# Patient Record
Sex: Male | Born: 1935 | Race: White | Hispanic: No | Marital: Married | Smoking: Never smoker
Health system: Southern US, Community
[De-identification: ages and names within clinical notes are randomized; demographics above are authoritative.]

## PROBLEM LIST (undated history)

## (undated) DIAGNOSIS — I639 Cerebral infarction, unspecified: Secondary | ICD-10-CM

## (undated) DIAGNOSIS — Z9889 Other specified postprocedural states: Secondary | ICD-10-CM

## (undated) DIAGNOSIS — M1990 Unspecified osteoarthritis, unspecified site: Secondary | ICD-10-CM

## (undated) DIAGNOSIS — B029 Zoster without complications: Secondary | ICD-10-CM

## (undated) DIAGNOSIS — N189 Chronic kidney disease, unspecified: Secondary | ICD-10-CM

## (undated) DIAGNOSIS — H9190 Unspecified hearing loss, unspecified ear: Secondary | ICD-10-CM

## (undated) DIAGNOSIS — E119 Type 2 diabetes mellitus without complications: Secondary | ICD-10-CM

## (undated) DIAGNOSIS — R112 Nausea with vomiting, unspecified: Secondary | ICD-10-CM

## (undated) DIAGNOSIS — E7800 Pure hypercholesterolemia, unspecified: Secondary | ICD-10-CM

## (undated) DIAGNOSIS — F419 Anxiety disorder, unspecified: Secondary | ICD-10-CM

## (undated) DIAGNOSIS — C801 Malignant (primary) neoplasm, unspecified: Secondary | ICD-10-CM

## (undated) DIAGNOSIS — I2510 Atherosclerotic heart disease of native coronary artery without angina pectoris: Secondary | ICD-10-CM

## (undated) DIAGNOSIS — I10 Essential (primary) hypertension: Secondary | ICD-10-CM

## (undated) HISTORY — DX: Pure hypercholesterolemia, unspecified: E78.00

## (undated) HISTORY — PX: TOTAL KNEE  PROSTHESIS REMOVAL W/ SPACER INSERTION: SHX2541

## (undated) HISTORY — DX: Zoster without complications: B02.9

## (undated) HISTORY — DX: Malignant (primary) neoplasm, unspecified: C80.1

## (undated) HISTORY — DX: Chronic kidney disease, unspecified: N18.9

## (undated) HISTORY — DX: Cerebral infarction, unspecified: I63.9

---
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
## 1993-09-12 HISTORY — PX: EYE SURGERY: SHX253

## 1995-09-13 HISTORY — PX: CORONARY ARTERY BYPASS GRAFT: SHX141

## 1998-10-09 ENCOUNTER — Ambulatory Visit (HOSPITAL_BASED_OUTPATIENT_CLINIC_OR_DEPARTMENT_OTHER): Admission: RE | Admit: 1998-10-09 | Discharge: 1998-10-09 | Payer: Self-pay | Admitting: Orthopedic Surgery

## 2001-04-26 ENCOUNTER — Ambulatory Visit (HOSPITAL_COMMUNITY): Admission: RE | Admit: 2001-04-26 | Discharge: 2001-04-26 | Payer: Self-pay | Admitting: Cardiology

## 2001-04-26 ENCOUNTER — Encounter: Payer: Self-pay | Admitting: Cardiology

## 2001-07-31 ENCOUNTER — Ambulatory Visit (HOSPITAL_COMMUNITY): Admission: RE | Admit: 2001-07-31 | Discharge: 2001-07-31 | Payer: Self-pay | Admitting: Ophthalmology

## 2004-10-28 ENCOUNTER — Ambulatory Visit: Payer: Self-pay | Admitting: *Deleted

## 2004-11-02 ENCOUNTER — Ambulatory Visit: Payer: Self-pay

## 2004-11-05 ENCOUNTER — Inpatient Hospital Stay (HOSPITAL_COMMUNITY): Admission: RE | Admit: 2004-11-05 | Discharge: 2004-11-09 | Payer: Self-pay | Admitting: Orthopedic Surgery

## 2004-11-05 ENCOUNTER — Ambulatory Visit: Payer: Self-pay | Admitting: Physical Medicine & Rehabilitation

## 2004-11-09 ENCOUNTER — Ambulatory Visit (HOSPITAL_COMMUNITY)
Admission: RE | Admit: 2004-11-09 | Discharge: 2004-11-09 | Payer: Self-pay | Admitting: Physical Medicine & Rehabilitation

## 2004-11-09 ENCOUNTER — Inpatient Hospital Stay
Admission: RE | Admit: 2004-11-09 | Discharge: 2004-11-16 | Payer: Self-pay | Admitting: Physical Medicine & Rehabilitation

## 2004-11-14 ENCOUNTER — Encounter (HOSPITAL_COMMUNITY)
Admission: RE | Admit: 2004-11-14 | Discharge: 2004-11-16 | Payer: Self-pay | Admitting: Physical Medicine & Rehabilitation

## 2010-01-12 ENCOUNTER — Ambulatory Visit (HOSPITAL_COMMUNITY): Admission: RE | Admit: 2010-01-12 | Discharge: 2010-01-12 | Payer: Self-pay | Admitting: Internal Medicine

## 2011-01-28 NOTE — H&P (Signed)
[name], [name] NO.:  [id number]   MEDICAL RECORD NO.:  [id number]          PATIENT TYPE:  ORB   LOCATION:                               FACILITY:  [facility]   PHYSICIAN:  Meredith Staggers, M.D.DATE OF BIRTH:  1935-10-21   DATE OF ADMISSION:  11/09/2004  DATE OF DISCHARGE:                                HISTORY & PHYSICAL   CHIEF COMPLAINT:  Right knee pain.   HISTORY OF PRESENT ILLNESS:  This is a 75 year old white male with history  of hypertension and coronary artery disease with increasing right knee pain  and DJD over the last several months to years.  Patient elected to undergo  right total knee replacement which was performed on November 05, 2004 by Dr.  Berenice Primas.  Patient was placed on Coumadin for DVT prophylaxis.  He has been  slow to progress with therapy and is at a mod to min assist level with basic  mobility and transfers.  It was decided to bring patient to subacute  rehabilitation to achieve modified independent goals.   REVIEW OF SYSTEMS:  Patient reports cough, reflux, low back pain, some  anxiety.  He has had problems with ongoing nausea, vomiting, and decreased  appetite which has been improving.   PAST MEDICAL HISTORY:  1.  Hypertension.  2.  Coronary artery disease with CABG in 1997.  3.  Cataract surgery.  4.  Gout.  5.  Hyperlipidemia.  6.  Anxiety.  7.  Asthma.  8.  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   HABITS:  Patient denies alcohol or tobacco.   FAMILY HISTORY:  Noncontributory.   SOCIAL HISTORY:  Patient lives with his wife in [place].  Patient works  part-time as a Hotel manager.  His wife works days full-time.  They live in a one-  level home with ramp.  Patient was independent prior to arrival.   MEDICATIONS PRIOR TO ADMISSION:  1.  Allopurinol 300 mg daily.  2.  Lipitor 20 mg q.h.s.  3.  Toprol 150 mg daily.  4.  Theo-Dur 300 mg b.i.d.  5.  Combivent inhaler.  6.  Aspirin 325 mg  daily.  7.  Dyazide 37.5/25 q.48h.  8.  Xanax 0.5 mg t.i.d. p.r.n.   ALLERGIES:  None.   LABORATORIES:  Hemoglobin 10.3.  Potassium 3.4.  INR 3.2.   PHYSICAL EXAMINATION:  VITAL SIGNS:  Patient is afebrile.  Vital signs are  stable.  GENERAL:  He is alert and oriented x3.  Pupils are equal, round, and  reactive to light and accommodation.  Extraocular eye movements are intact.  Oral mucosa is pink and moist.  NECK:  Supple without JVD or adenopathy.  CHEST:  Clear bilaterally without wheezes, rales, or rhonchi.  HEART:  Regular rate and rhythm without murmurs, rubs, or gallops.  ABDOMEN:  Soft, nontender, and minimally distended.  MUSCULOSKELETAL:  Patient had limited range of motion of the left shoulder.  He was frozen in extension at the left wrist.  He was minus 28-30 degrees at  the left elbow for full extension.  He was able to actively adduct the left  shoulder to plus 75-80 degrees.  Left arm was hypoplastic in comparison to  the right side.  Right knee was appropriately tender.  Patient had 1+/2  strength at the hips and knee on the right and 4/5 strength distally.  Left  lower extremity was 4+/5 throughout.  Right upper extremity was within  normal limits at 5/5.  NEUROLOGIC:  Sensory examination was grossly intact.  Reflexes were 2+.  Cranial nerve examination was unremarkable.  Patient was normal in respect  to judgment, orientation, and memory.  Affect is appropriate.  SKIN:  It was of note that there was some mild redness around the right knee  wound which was moderately warm.   ASSESSMENT/PLAN:  1.  Right knee osteoarthritis with right total knee replacement performed on      November 05, 2004.  Begin SACU level therapies.  Goals are modified      independent.  Estimated length of stay is about seven days.  Prognosis      is good.  2.  Pain management with oxycodone IR p.r.n. as well as p.r.n. Robaxin for      spasms.  3.  Deep venous thrombosis prophylaxis with  Coumadin.  4.  Hypertension.  This appears stable.  Monitor on Toprol and Maxzide      daily.  5.  Asthma, Combivent, theophylline daily.  6.  Coronary artery disease.  Patient stable here.  Resume aspirin after      Coumadin is complete.  7.  Anemia.  Begin Trinsicon one p.o. b.i.d.      ZTS/MEDQ  D:  11/09/2004  T:  11/09/2004  Job:  [id number]

## 2011-01-28 NOTE — Discharge Summary (Signed)
NAMEYUDA, CUPO NO.:  [id number]   MEDICAL RECORD NO.:  [id number]          PATIENT TYPE:  ORB   LOCATION:  [id number]                         FACILITY:  [facility]   PHYSICIAN:  Meredith Staggers, M.D.DATE OF BIRTH:  Jan 15, 1936   DATE OF ADMISSION:  11/09/2004  DATE OF DISCHARGE:  11/16/2004                                 DISCHARGE SUMMARY   DISCHARGE DIAGNOSES:  1.  Right total knee arthroplasty secondary to osteoarthritis.  2.  Deep venous thrombosis prophylaxis.  3.  History of asthma.  4.  Gout.  5.  History of hyperlipidemia.  6.  Anemia.  7.  Mild hypokalemia, resolved.  8.  Tachycardia.   HISTORY OF PRESENT ILLNESS:  The patient is a 75 year old white male with  past medical history of hypertension and cardiovascular disease, right knee  pain secondary to degenerative joint disease which failed conservative care,  therefore, the patient was admitted on November 09, 2004 for right total  knee arthroplasty by Dr. Dorna Leitz. Patient was placed on Coumadin for  deep venous thrombosis prophylaxis.  Postoperative complications included  anemia.  Physical therapy report at this time indicates the patient is  ambulatory total assist 10 feet with rolling walker and transfer mod assist,  bed mobility mod assist.  The patient was transferred to [facility] subacute  department on November 09, 2004 for more therapies.  Review of systems was  significant for cough, reflux, back pain, anxiety.   PAST MEDICAL HISTORY:  Significant for hypertension, cardiovascular disease,  coronary artery bypass grafting, cataract surgery, gout, hyperlipidemia,  anxiety, asthma, left arm birth defect.   FAMILY HISTORY:  Noncontributory.   SOCIAL HISTORY:  Patient lives with wife in [place].  Patient works part  time as a Hotel manager.  Wife and daughter work during days but can assist.  Patient lives in one level home with ramp.   ADMISSION MEDICATIONS:  1.  Allopurinol 300 mg  daily.  2.  Lipitor 20 mg daily.  3.  Toprol XL 50 mg daily.  4.  Theophylline 300 mg b.i.d.  5.  Combivent inhaler as needed.  6.  Aspirin 325 mg daily.  7.  Maxzide 37.5 mg q.48h.  8.  Xanax 0.5 mg three times a day.   HOSPITAL COURSE:  Mr. Denton Lamarche was admitted to [facility] Subacute  Department on November 09, 2004 for an hour of therapies daily.  Overall he  progressed very well to overall modified independent level.  The patient was  able to tolerate therapies and remained on oxycodone as needed for pain.  The patient continued to take Lovenox until INR was therapeutic.  He was  discharged home on Coumadin 4 mg daily until December 03, 2004.  His hospital  course was significant for tachycardia with slightly increased blood  pressure, gout flare up and anemia.  He remained on Trinsicon one tablet  p.o. b.i.d. for anemia.  Latest hemoglobin performed on November 10, 2004 was  11.1 and hematocrit 31.8.  Patient received colchicine 0.6 mg p.o. b.i.d.  for three days as well as a steroid patch for  gout flare up in his right  toe.  After application of prednisone patch he improved significantly.  Patient also prior to admission was on Toprol XL 50 mg daily.  This was  increased to 100 mg XL daily due to tachycardia and increased blood  pressure.  Blood pressure and tachycardia did improve.  Patient was  recommended to follow up with his primary care physician, Dr. Caron Presume,  regarding tachycardia and blood pressure.  Patient did receive a short dose  of potassium supplementation for hypokalemia.  His potassium was 4.2 on  November 12, 2004.  Potassium was discontinued at the time of discharge.  Overall the patient was able to tolerate the therapies very well. He did  have past surgery his incision was healing very well.  He was able to  ambulate greater than 100 feet with modified rolling walker, able to  transfer modified independently, bed mobility modified independently.  Patient had  approximately 0 to 70 degrees range of motion in his right knee,  will go home on CPM machine.  There was no other major issues that occurred  while the patient was in rehab.  Physical therapy recommends home CPM,  continue physical therapy.   LABORATORY DATA:  Latest labs indicated his last uric acid level was November 15, 2004, was 4.9.  Latest INR was 2.7.  Latest hemoglobin was 11.1,  hematocrit 31.8, white blood cell count 10.8, platelet count 471,000.  Sodium 132, potassium 4.2, chloride 97, cO2 26, glucose 123, BUN 13,  creatinine 1.0.   The patient at the time of discharge was able to perform most activities of  daily living modified independently supervision level.  Able to transfer  modified independently, bed mobility modified independently, ambulation to  100 feet modified independently with platform rolling walker plus he had 0  to 70 degrees range of motion in his right knee.  Recommend he was  discharged home with his family.  At the time of discharge his surgical  incision was healed with no signs of infection.  There was about 1+ edema.  He is discharged home with his family.   DISCHARGE MEDICATIONS:  1.  Toprol XL 100 mg daily.  2.  Allopurinol 300 mg daily.  3.  Resume Maxzide and Combivent.  4.  Aspirin.  Do not take until complete Coumadin.  5.  Lipitor 20 mg at night.  6.  Theo-Dur 300 mg AM and in PM.  7.  Trinsicon one tablet twice daily.  8.  Xanax 0.5 mg twice daily.  9.  Coumadin 2 mg in PM in until December 03, 2004.  10. Oxycodone 5 to 10 mg every 4-6 hours as needed.  11. Robaxin 500 mg one to two tablets every 6 to 8 hours as needed.  12. Prednisone per Dosepak instructions.   DIET:  Low salt, low cholesterol.   FOLLOW UP:  He will follow up with Dr. Caron Presume in four to six weeks to  check for tachycardia and blood pressure and his potassium.  Follow up with  Dr. Dorna Leitz in two weeks, call for appointment, have [facility] with physical  therapy and occupational therapy and home health nurse to draw  Pro Time and INR on  Thursday, November 18, 2004.  No alcohol, no tobacco, no driving.  He is weight-  bearing as tolerated.  No aspirin, Aleve, ibuprofen while on Coumadin.  Any  questions call Pamella Pert.  Continue occupational therapy and physical  therapy through [facility].      LB/MEDQ  D:  11/16/2004  T:  11/16/2004  Job:  [id number]   cc:   Alta Corning, M.D.  [address] [place]  [place] [address]  Fax: [phone]   Bonne Dolores, M.D.  [address] [place]  [place] [address]  Fax: [phone]

## 2011-01-28 NOTE — Op Note (Signed)
[name], [name] NO.:  [id number]   MEDICAL RECORD NO.:  [id number]          PATIENT TYPE:  INP   LOCATION:  [id number]                         FACILITY:  [facility]   PHYSICIAN:  Alta Corning, M.D.   DATE OF BIRTH:  1936/08/23   DATE OF PROCEDURE:  11/05/2004  DATE OF DISCHARGE:                                 OPERATIVE REPORT   PREOPERATIVE DIAGNOSIS:  End stage degenerative joint disease, right knee.   POSTOPERATIVE DIAGNOSIS:  End stage degenerative joint disease, right knee.   PROCEDURE:  Right total knee replacement with Sigma system, size 3 femur,  size 4 tibia, 10 mm bridging bearing, 32 mm all polyethylene patella.   SURGEON:  Alta Corning, M.D.   ASSISTANT:  Gary Fleet, P.A.-C.   ANESTHESIA:  General.   BRIEF HISTORY:  75 year old male with a long history of having significant  right knee pain.  We have treated him for five years with anti-inflammatory  medications, injection therapy, activity modification, and at the end of all  this, he still continued to complain of significant right lower extremity  pain, so he was brought to the operating room for right total knee  replacement.  Preoperative x-rays showed bone on bone degenerative changes.   DESCRIPTION OF PROCEDURE:  The patient was brought to the operating room and  after adequate anesthesia was obtained with a general anesthetic, the  patient was placed on the operating table.  The right leg was prepped and  draped in the usual sterile fashion.  Following this, a midline incision was  made and the subcutaneous tissue were dissected down to the level of the  extensor mechanism.  A medial parapatellar arthrotomy was undertaken as well  as release of the medial side.  The medial and lateral meniscus were  removed, at this point, as well as anterior and posterior cruciates and  retropatellar fat pad.  The tibia was then cut perpendicular to its long  axis, the distal femur was cut, and  spacing block was put in place, 10 mm  block gave easy full extension.  The attention was turned towards sizing  where the femur was sized to a 3 and then cuts were made, anterior,  posterior, and chamfers, the 10 block was used prior to cutting the  posterior to make sure that this was the right size and it was.  Following  this, attention was turned towards the box cut and this was cut and then the  tibia was sized to a 4.  It was pinned and central peg was drilled as well  as the cut outs.  The patella was cut to a level of 13 mm and a 32 mm all  poly patella was chosen and the lugs were drilled.  The trial components  were then removed.  The knee was copiously irrigated and suctioned dry.  The  tibia then had cement placed into the interstices of the dried area and the  components were cemented into place, size 4 tibia, size 3 femur, 35 mm all  poly patella, 10 mm bridging bearing.  A  trial bearing was put in place  initially.  All excess cement was removed with the cement tool, and the  final poly was then put in place and the patella was cemented in place.  The  components were allowed to harden in place.  A medium Hemovac was  put in place.  The parapatellar arthrotomy was closed with #1 Vicryl running  locking suture and the skin was closed with 0 and 2-0 Ethibond and skin  staples.  A sterile compression dressing was applied.  The patient was taken  to the recovery room where he was noted to be in satisfactory condition.  Estimated blood loss for the procedure was none.      JLG/MEDQ  D:  11/05/2004  T:  11/05/2004  Job:  [id number]

## 2011-01-28 NOTE — Discharge Summary (Signed)
[name], Warren NO.:  [id number]   MEDICAL RECORD NO.:  [id number]          PATIENT TYPE:  INP   LOCATION:  [id number]                         FACILITY:  [facility]   PHYSICIAN:  Gary Fleet, P.A.    DATE OF BIRTH:  Jul 12, 1936   DATE OF ADMISSION:  11/05/2004  DATE OF DISCHARGE:  11/09/2004                                 DISCHARGE SUMMARY   ADMITTING DIAGNOSES:  1.  End-stage degenerative joint disease, right knee.  2.  Hypertension.  3.  Coronary artery disease.  4.  History of gout.   DISCHARGE DIAGNOSES:  1.  End-stage degenerative joint disease, right knee.  2.  Hypertension.  3.  Coronary artery disease.  4.  History of gout.   PROCEDURES IN HOSPITAL:  Right total knee arthroplasty, Dorna Leitz, MD,  November 05, 2004.   BRIEF HISTORY:  [name] is a pleasant 75 year old male, who has a long  history of right knee pain.  Standing x-rays of the right knee show bone-on-  bone arthritis.  He has night pain and pain with ambulation.  He got no  relief with injections, modification of activity, and exercises.  Based upon  his clinical and radiographic findings, he was felt to be a candidate for a  right total knee arthroplasty, and he was admitted for this.   PERTINENT LABORATORY STUDIES:  EKG on admission showed sinus bradycardia  with incomplete right bundle branch block, borderline EKG, no old tracing to  compare.  Hemoglobin on admission was 14.1, hematocrit 40.5.  On postop day  #1, his hemoglobin was 12.1, postop day #2, 10.9, postop day #3, 10.5, and  postop day #4, 10.3.  Protime was 12.7 seconds on admission with an INR of  0.9.  PTT was 28.  On Coumadin therapy on the date of discharge, patient's  INR was 3.2 with a protime of 25.2.  CMET on admission was within normal  limits.  On postop day #1, BMET was within normal limits.  On postop day #2,  he had a slightly decreased potassium at 3.4, elevated glucose at 173 on his  BMET.  Urinalysis  showed no abnormalities.   HOSPITAL COURSE:  The patient underwent right total knee arthroplasty as  well-described in Dr. Berenice Primas' operative note on November 05, 2004.  Postoperatively, he was put on a PCA morphine pump for pain control and was  given 1 gm of Ancef q.8h x5 doses IV.  A CPM machine was ordered for knee  range of motion on the right side.  On postop day #1, he was gotten out of  bed with Physical Therapy.  His hemoglobin was stable.  His BMET was within  normal limits, and his INR was stable on Coumadin with an INR of 1.1.  On  postop day #2, he had a low-grade fever of 101.6.  Incentive spirometry was  encouraged.  He had some nausea and vomiting, but patient was resting  comfortably.  He made good progress with physical therapy.  His PCA pump and  his Foley catheter were discontinued, the right knee dressing was changed,  and the Hemovac drain was pulled.  On postop day #3, he had some severe  nausea.  His vital signs were stable and afebrile.  He was making good  progress with physical therapy, although somewhat slow.  His IV fluids were  increased.  We continued to mobilize the patient.  He was also noted to be  on Coumadin for DVT prophylaxis.  On postop day #4, his nausea and vomiting  had improved, he was taking p.o. ice chips without difficulty, he was  voiding okay, and he had had a bowel movement.  Vital signs were stable and  afebrile.  His right knee dressing was clean and dry.  His calf was soft and  NV was intact distally.  His INR was 3.2 on Coumadin, hemoglobin 10.3,  potassium 3.4.  His diet was increased to a regular, his IV was converted to  a saline lock, and he was felt to be stable medically to be transferred to  the subacute care unit when a bed was available, which there was.  He was  then discharged and transferred to the subacute care unit at [facility]:  Improved.   DIET ON DISCHARGE:  Regular.   MEDICATIONS ON  DISCHARGE:  1.  Percocet 5 mg 1-2 q.4h p.r.n. for pain.  2.  Coumadin per pharmacy protocol.  3.  Lipitor 20 mg one daily.  4.  Toprol-XL 50 mg one daily.  5.  Theo-Dur 300 mg b.i.d.  6.  Xanax 0.5 mg one t.i.d. p.r.n.  7.  Combivent inhaler p.r.n.  8.  Dyazide 37.5/25 mg one q.o.d.  9.  Allopurinol 300 mg daily.   ACTIVITY STATUS:  Weightbearing as tolerated on the right with a walker.  He  will continue to use a CPM machine.  We will see him every few days while on  the SACU unit.       JB/MEDQ  D:  02/25/2005  T:  02/26/2005  Job:  [id number]   cc:   Bonne Dolores, M.D.  [address] [place] [address]  Fax: [employer], M.D.

## 2011-11-21 NOTE — Patient Instructions (Addendum)
[place]  11/21/2011   Your procedure is scheduled on:  11/28/2011  Report to [facility] at  55  AM.  Call this number if you have problems the morning of surgery: [phone]   Remember:   Do not eat food:After Midnight.  May have clear liquids:until Midnight .  Clear liquids include soda, tea, black coffee, apple or grape juice, broth.  Take these medicines the morning of surgery with A SIP OF WATER: xanax,alloprinol,tenormin,hctz,theodur. Take combivent before you come.   Do not wear jewelry, make-up or nail polish.  Do not wear lotions, powders, or perfumes. You may wear deodorant.  Do not shave 48 hours prior to surgery.  Do not bring valuables to the hospital.  Contacts, dentures or bridgework may not be worn into surgery.  Leave suitcase in the car. After surgery it may be brought to your room.  For patients admitted to the hospital, checkout time is 11:00 AM the day of discharge.   Patients discharged the day of surgery will not be allowed to drive home.  Name and phone number of your driver: family  Special Instructions: N/A   Please read over the following fact sheets that you were given: Pain Booklet, Surgical Site Infection Prevention, Anesthesia Post-op Instructions and Care and Recovery After Surgery Cataract A cataract is a clouding of the lens of the eye. When a lens becomes cloudy, vision is reduced based on the degree and nature of the clouding. Many cataracts reduce vision to some degree. Some cataracts make people more near-sighted as they develop. Other cataracts increase glare. Cataracts that are ignored and become worse can sometimes look white. The white color can be seen through the pupil. CAUSES   Aging. However, cataracts may occur at any age, even in newborns.   Certain drugs.   Trauma to the eye.   Certain diseases such as diabetes.   Specific eye diseases such as chronic inflammation inside the eye or a sudden attack of a rare form of  glaucoma.   Inherited or acquired medical problems.  SYMPTOMS   Gradual, progressive drop in vision in the affected eye.   Severe, rapid visual loss. This most often happens when trauma is the cause.  DIAGNOSIS  To detect a cataract, an eye doctor examines the lens. Cataracts are best diagnosed with an exam of the eyes with the pupils enlarged (dilated) by drops.  TREATMENT  For an early cataract, vision may improve by using different eyeglasses or stronger lighting. If that does not help your vision, surgery is the only effective treatment. A cataract needs to be surgically removed when vision loss interferes with your everyday activities, such as driving, reading, or watching TV. A cataract may also have to be removed if it prevents examination or treatment of another eye problem. Surgery removes the cloudy lens and usually replaces it with a substitute lens (intraocular lens, IOL).  At a time when both you and your doctor agree, the cataract will be surgically removed. If you have cataracts in both eyes, only one is usually removed at a time. This allows the operated eye to heal and be out of danger from any possible problems after surgery (such as infection or poor wound healing). In rare cases, a cataract may be doing damage to your eye. In these cases, your caregiver may advise surgical removal right away. The vast majority of people who have cataract surgery have better vision afterward. HOME CARE INSTRUCTIONS  If you are not planning  surgery, you may be asked to do the following:  Use different eyeglasses.   Use stronger or brighter lighting.   Ask your eye doctor about reducing your medicine dose or changing medicines if it is thought that a medicine caused your cataract. Changing medicines does not make the cataract go away on its own.   Become familiar with your surroundings. Poor vision can lead to injury. Avoid bumping into things on the affected side. You are at a higher risk for  tripping or falling.   Exercise extreme care when driving or operating machinery.   Wear sunglasses if you are sensitive to bright light or experiencing problems with glare.  SEEK IMMEDIATE MEDICAL CARE IF:   You have a worsening or sudden vision loss.   You notice redness, swelling, or increasing pain in the eye.   You have a fever.  Document Released: 08/29/2005 Document Revised: 08/18/2011 Document Reviewed: 04/22/2011 [facility] Patient Information 2012 [place].PATIENT INSTRUCTIONS POST-ANESTHESIA  IMMEDIATELY FOLLOWING SURGERY:  Do not drive or operate machinery for the first twenty four hours after surgery.  Do not make any important decisions for twenty four hours after surgery or while taking narcotic pain medications or sedatives.  If you develop intractable nausea and vomiting or a severe headache please notify your doctor immediately.  FOLLOW-UP:  Please make an appointment with your surgeon as instructed. You do not need to follow up with anesthesia unless specifically instructed to do so.  WOUND CARE INSTRUCTIONS (if applicable):  Keep a dry clean dressing on the anesthesia/puncture wound site if there is drainage.  Once the wound has quit draining you may leave it open to air.  Generally you should leave the bandage intact for twenty four hours unless there is drainage.  If the epidural site drains for more than 36-48 hours please call the anesthesia department.  QUESTIONS?:  Please feel free to call your physician or the hospital operator if you have any questions, and they will be happy to assist you.     [place] [phone]

## 2011-11-22 ENCOUNTER — Encounter (HOSPITAL_COMMUNITY): Payer: Self-pay

## 2011-11-22 ENCOUNTER — Encounter (HOSPITAL_COMMUNITY): Payer: Self-pay | Admitting: Pharmacy Technician

## 2011-11-22 ENCOUNTER — Other Ambulatory Visit: Payer: Self-pay

## 2011-11-22 ENCOUNTER — Encounter (HOSPITAL_COMMUNITY)
Admission: RE | Admit: 2011-11-22 | Discharge: 2011-11-22 | Disposition: A | Discharge status: Home / Self Care | Payer: Medicare Other | Source: Ambulatory Visit | Attending: Ophthalmology | Admitting: Ophthalmology

## 2011-11-22 HISTORY — DX: Atherosclerotic heart disease of native coronary artery without angina pectoris: I25.10

## 2011-11-22 HISTORY — DX: Unspecified hearing loss, unspecified ear: H91.90

## 2011-11-22 HISTORY — DX: Other specified postprocedural states: Z98.890

## 2011-11-22 HISTORY — DX: Essential (primary) hypertension: I10

## 2011-11-22 HISTORY — DX: Unspecified osteoarthritis, unspecified site: M19.90

## 2011-11-22 HISTORY — DX: Nausea with vomiting, unspecified: R11.2

## 2011-11-22 HISTORY — DX: Anxiety disorder, unspecified: F41.9

## 2011-11-22 LAB — HEMOGLOBIN AND HEMATOCRIT, BLOOD
HCT: 38.5 % — ABNORMAL LOW (ref 39.0–52.0)
Hemoglobin: 13.4 g/dL (ref 13.0–17.0)

## 2011-11-22 LAB — BASIC METABOLIC PANEL
BUN: 27 mg/dL — ABNORMAL HIGH (ref 6–23)
Calcium: 10.1 mg/dL (ref 8.4–10.5)
Creatinine, Ser: 1.41 mg/dL — ABNORMAL HIGH (ref 0.50–1.35)
GFR calc Af Amer: 56 mL/min — ABNORMAL LOW (ref >90)

## 2011-11-22 MED ORDER — CYCLOPENTOLATE-PHENYLEPHRINE 0.2-1 % OP SOLN
OPHTHALMIC | Status: AC
  Filled 2011-11-22: qty 2

## 2011-11-22 MED ORDER — TETRACAINE HCL 0.5 % OP SOLN: 1.0000 [drp] | Freq: Once | OPHTHALMIC | Status: DC

## 2011-11-28 ENCOUNTER — Ambulatory Visit (HOSPITAL_COMMUNITY): Payer: Medicare Other | Admitting: Anesthesiology

## 2011-11-28 ENCOUNTER — Encounter (HOSPITAL_COMMUNITY): Payer: Self-pay | Admitting: Anesthesiology

## 2011-11-28 ENCOUNTER — Ambulatory Visit (HOSPITAL_COMMUNITY)
Admission: RE | Admit: 2011-11-28 | Discharge: 2011-11-28 | Disposition: A | Discharge status: Home / Self Care | Payer: Medicare Other | Source: Ambulatory Visit | Attending: Ophthalmology | Admitting: Ophthalmology

## 2011-11-28 ENCOUNTER — Encounter (HOSPITAL_COMMUNITY)
Admission: RE | Disposition: A | Discharge status: Home / Self Care | Payer: Self-pay | Source: Ambulatory Visit | Attending: Ophthalmology

## 2011-11-28 ENCOUNTER — Encounter (HOSPITAL_COMMUNITY): Payer: Self-pay | Admitting: *Deleted

## 2011-11-28 DIAGNOSIS — Z951 Presence of aortocoronary bypass graft: Secondary | ICD-10-CM | POA: Insufficient documentation

## 2011-11-28 DIAGNOSIS — Z79899 Other long term (current) drug therapy: Secondary | ICD-10-CM | POA: Insufficient documentation

## 2011-11-28 DIAGNOSIS — Z01810 Encounter for preprocedural cardiovascular examination: Secondary | ICD-10-CM | POA: Insufficient documentation

## 2011-11-28 DIAGNOSIS — Z7982 Long term (current) use of aspirin: Secondary | ICD-10-CM | POA: Insufficient documentation

## 2011-11-28 DIAGNOSIS — I10 Essential (primary) hypertension: Secondary | ICD-10-CM | POA: Insufficient documentation

## 2011-11-28 DIAGNOSIS — H2510 Age-related nuclear cataract, unspecified eye: Secondary | ICD-10-CM | POA: Insufficient documentation

## 2011-11-28 DIAGNOSIS — Z01812 Encounter for preprocedural laboratory examination: Secondary | ICD-10-CM | POA: Insufficient documentation

## 2011-11-28 DIAGNOSIS — I2510 Atherosclerotic heart disease of native coronary artery without angina pectoris: Secondary | ICD-10-CM | POA: Insufficient documentation

## 2011-11-28 HISTORY — PX: CATARACT EXTRACTION W/PHACO: SHX586

## 2011-11-28 SURGERY — PHACOEMULSIFICATION, CATARACT, WITH IOL INSERTION
Anesthesia: Monitor Anesthesia Care | Site: Eye | Laterality: Right | Wound class: Clean

## 2011-11-28 MED ORDER — TETRACAINE HCL 0.5 % OP SOLN
OPHTHALMIC | Status: AC
  Administered 2011-11-28: 07:00:00
  Filled 2011-11-28: qty 2

## 2011-11-28 MED ORDER — NA HYALUR & NA CHOND-NA HYALUR 0.55-0.5 ML IO KIT
PACK | INTRAOCULAR | Status: DC | PRN
  Administered 2011-11-28: 1 via OPHTHALMIC

## 2011-11-28 MED ORDER — LIDOCAINE HCL 3.5 % OP GEL: Freq: Once | OPHTHALMIC | Status: DC

## 2011-11-28 MED ORDER — MIDAZOLAM HCL 2 MG/2ML IJ SOLN
1.0000 mg | INTRAMUSCULAR | Status: DC | PRN
  Administered 2011-11-28: 2 mg via INTRAVENOUS

## 2011-11-28 MED ORDER — MIDAZOLAM HCL 5 MG/5ML IJ SOLN
INTRAMUSCULAR | Status: DC | PRN
  Administered 2011-11-28 (×2): 1 mg via INTRAVENOUS

## 2011-11-28 MED ORDER — GATIFLOXACIN 0.5 % OP SOLN OPTIME - NO CHARGE
OPHTHALMIC | Status: DC | PRN
  Administered 2011-11-28: 1 [drp] via OPHTHALMIC

## 2011-11-28 MED ORDER — MIDAZOLAM HCL 2 MG/2ML IJ SOLN
INTRAMUSCULAR | Status: AC
  Administered 2011-11-28: 2 mg via INTRAVENOUS
  Filled 2011-11-28: qty 2

## 2011-11-28 MED ORDER — BSS IO SOLN
INTRAOCULAR | Status: DC | PRN
  Administered 2011-11-28: 15 mL via INTRAOCULAR

## 2011-11-28 MED ORDER — LIDOCAINE HCL 3.5 % OP GEL
OPHTHALMIC | Status: AC
  Filled 2011-11-28: qty 5

## 2011-11-28 MED ORDER — MOXIFLOXACIN HCL 0.5 % OP SOLN - NO CHARGE
1.0000 [drp] | Freq: Once | OPHTHALMIC | Status: DC
  Filled 2011-11-28: qty 3

## 2011-11-28 MED ORDER — EPINEPHRINE HCL 1 MG/ML IJ SOLN
INTRAMUSCULAR | Status: AC
  Filled 2011-11-28: qty 1

## 2011-11-28 MED ORDER — GATIFLOXACIN 0.5 % OP SOLN OPTIME - NO CHARGE
1.0000 [drp] | Freq: Once | OPHTHALMIC | Status: AC
  Administered 2011-11-28: 1 [drp] via OPHTHALMIC
  Filled 2011-11-28: qty 2.5

## 2011-11-28 MED ORDER — ONDANSETRON HCL 4 MG/2ML IJ SOLN
4.0000 mg | Freq: Once | INTRAMUSCULAR | Status: AC
  Administered 2011-11-28: 4 mg via INTRAVENOUS

## 2011-11-28 MED ORDER — TETRACAINE 0.5 % OP SOLN OPTIME - NO CHARGE
OPHTHALMIC | Status: DC | PRN
  Administered 2011-11-28: 2 [drp] via OPHTHALMIC

## 2011-11-28 MED ORDER — MIDAZOLAM HCL 2 MG/2ML IJ SOLN
INTRAMUSCULAR | Status: AC
  Filled 2011-11-28: qty 2

## 2011-11-28 MED ORDER — KETOROLAC TROMETHAMINE 0.4 % OP SOLN - NO CHARGE
1.0000 [drp] | Freq: Once | OPHTHALMIC | Status: AC
  Administered 2011-11-28: 1 [drp] via OPHTHALMIC
  Filled 2011-11-28: qty 5

## 2011-11-28 MED ORDER — LIDOCAINE 3.5 % OP GEL OPTIME - NO CHARGE
OPHTHALMIC | Status: DC | PRN
  Administered 2011-11-28: 2 [drp] via OPHTHALMIC

## 2011-11-28 MED ORDER — POVIDONE-IODINE 5 % OP SOLN
OPHTHALMIC | Status: DC | PRN
  Administered 2011-11-28: 1 via OPHTHALMIC

## 2011-11-28 MED ORDER — LACTATED RINGERS IV SOLN
INTRAVENOUS | Status: DC
  Administered 2011-11-28: 07:00:00 via INTRAVENOUS

## 2011-11-28 MED ORDER — FENTANYL CITRATE 0.05 MG/ML IJ SOLN: 25.0000 ug | INTRAMUSCULAR | Status: AC | PRN

## 2011-11-28 MED ORDER — LIDOCAINE HCL (PF) 1 % IJ SOLN
INTRAMUSCULAR | Status: AC
  Filled 2011-11-28: qty 2

## 2011-11-28 MED ORDER — ONDANSETRON HCL 4 MG/2ML IJ SOLN
INTRAMUSCULAR | Status: AC
  Administered 2011-11-28: 4 mg via INTRAVENOUS
  Filled 2011-11-28: qty 2

## 2011-11-28 MED ORDER — EPINEPHRINE HCL 1 MG/ML IJ SOLN
INTRAOCULAR | Status: DC | PRN
  Administered 2011-11-28: 07:00:00

## 2011-11-28 MED ORDER — ONDANSETRON HCL 4 MG/2ML IJ SOLN: 4.0000 mg | Freq: Once | INTRAMUSCULAR | Status: AC | PRN

## 2011-11-28 MED ORDER — CARBACHOL 0.01 % IO SOLN
INTRAOCULAR | Status: AC
  Filled 2011-11-28: qty 1.5

## 2011-11-28 SURGICAL SUPPLY — 28 items
CAPSULAR TENSION RING-AMO (OPHTHALMIC RELATED) IMPLANT
CLOTH BEACON ORANGE TIMEOUT ST (SAFETY) ×1 IMPLANT
GLOVE BIO SURGEON STRL SZ7.5 (GLOVE) IMPLANT
GLOVE BIOGEL M 6.5 STRL (GLOVE) IMPLANT
GLOVE BIOGEL PI IND STRL 6.5 (GLOVE) IMPLANT
GLOVE BIOGEL PI IND STRL 7.0 (GLOVE) IMPLANT
GLOVE BIOGEL PI INDICATOR 6.5 (GLOVE) ×2
GLOVE BIOGEL PI INDICATOR 7.0 (GLOVE)
GLOVE ECLIPSE 6.5 STRL STRAW (GLOVE) IMPLANT
GLOVE ECLIPSE 7.5 STRL STRAW (GLOVE) IMPLANT
GLOVE EXAM NITRILE LRG STRL (GLOVE) IMPLANT
GLOVE EXAM NITRILE MD LF STRL (GLOVE) ×1 IMPLANT
GLOVE SKINSENSE NS SZ6.5 (GLOVE)
GLOVE SKINSENSE NS SZ7.0 (GLOVE)
GLOVE SKINSENSE STRL SZ6.5 (GLOVE) IMPLANT
GLOVE SKINSENSE STRL SZ7.0 (GLOVE) IMPLANT
GOWN STRL REIN 2XL LVL4 (GOWN DISPOSABLE) ×1 IMPLANT
INST SET CATARACT ~~LOC~~ (KITS) ×2 IMPLANT
KIT VITRECTOMY (OPHTHALMIC RELATED) IMPLANT
PAD ARMBOARD 7.5X6 YLW CONV (MISCELLANEOUS) ×1 IMPLANT
PROC W NO LENS (INTRAOCULAR LENS)
PROC W SPEC LENS (INTRAOCULAR LENS)
PROCESS W NO LENS (INTRAOCULAR LENS) IMPLANT
PROCESS W SPEC LENS (INTRAOCULAR LENS) IMPLANT
RING MALYGIN (MISCELLANEOUS) IMPLANT
SIGHTPATH CAT PROC W REG LENS (Ophthalmic Related) ×2 IMPLANT
VISCOELASTIC ADDITIONAL (OPHTHALMIC RELATED) IMPLANT
WATER STERILE IRR 250ML POUR (IV SOLUTION) ×1 IMPLANT

## 2011-11-28 NOTE — Anesthesia Procedure Notes (Signed)
Procedure Name: MAC Date/Time: 11/28/2011 7:45 AM Performed by: Vista Deck Pre-anesthesia Checklist: Patient identified, Emergency Drugs available, Suction available, Timeout performed and Patient being monitored Patient Re-evaluated:Patient Re-evaluated prior to inductionOxygen Delivery Method: Nasal Cannula

## 2011-11-28 NOTE — Op Note (Signed)
See scanned op note done today in another system

## 2011-11-28 NOTE — Anesthesia Postprocedure Evaluation (Signed)
  Anesthesia Post-op Note  Patient: [name]  Procedure(s) Performed: Procedure(s) (LRB): CATARACT EXTRACTION PHACO AND INTRAOCULAR LENS PLACEMENT (IOC) (Right)  Patient Location:  Short Stay  Anesthesia Type: MAC  Level of Consciousness: awake  Airway and Oxygen Therapy: Patient Spontanous Breathing  Post-op Pain: none  Post-op Assessment: Post-op Vital signs reviewed, Patient's Cardiovascular Status Stable, Respiratory Function Stable, Patent Airway, No signs of Nausea or vomiting and Pain level controlled  Post-op Vital Signs: Reviewed and stable  Complications: No apparent anesthesia complications

## 2011-11-28 NOTE — Brief Op Note (Signed)
11/28/2011  9:24 AM  PATIENT:  [name]  76 y.o. male  PRE-OPERATIVE DIAGNOSIS:  nuclear cataract right eye  POST-OPERATIVE DIAGNOSIS:  nuclear cataract right eye  PROCEDURE:  Procedure(s): CATARACT EXTRACTION PHACO AND INTRAOCULAR LENS PLACEMENT (IOC)  SURGEON:  Surgeon(s): Williams Che, MD  ASSISTANTS: Zoila Shutter, CST   ANESTHESIA STAFF: Vista Deck, CRNA - CRNA Lerry Liner, MD - Anesthesiologist  ANESTHESIA:   topical and MAC  REQUESTED LENS POWER: 20.0  LENS IMPLANT INFORMATION: see op note  CUMULATIVE DISSIPATED ENERGY:3.84  INDICATIONS:see dictated H&P  OP FINDINGS:moderately dense NS  COMPLICATIONS:None  DICTATION #: see printed/scanned op note  PLAN OF CARE: per patient instructions  PATIENT DISPOSITION:  Short Stay

## 2011-11-28 NOTE — H&P (Signed)
I have reviewed the pre printed H&P, the patient was re-examined, and I have identified no significant interval changes in the patient's medical condition.  There is no change in the plan of care since the history and physical of record. 

## 2011-11-28 NOTE — Discharge Instructions (Signed)
[name] 11/28/2011 Dr. Iona Hansen Post operative Instructions for Cataract Patients  These instructions are for [place] and pertain to the operative eye.  1.  Resume your normal diet and previous oral medicines.  2. Your Follow-up appointment is at Dr. Iona Hansen' office in [employer] on Tuesday, 11/29/11 at 10:00am  .  3. You may leave the hospital when your driver is present and your nurse releases you.  4. Begin Pred Forte (prednisolone acetate 1%), Acular LS (ketorolac tromethamine .4%) and Gatifloxacin 0.5% eye drops; 1 drop each 4 times daily to operative eye. Begin 3 hours after discharge from Short Stay Unit.  Moxifloxacin 0.5% may be substituted for Gatifloxacin using the same instructions.  26. Page Dr. Iona Hansen via beeper [phone] for significant pain in or around operative eye that is not relieved by Tylenol.  6. If you took Plavix before surgery, restart it at the usual dose on the evening of surgery.  7. Wear dark glasses as necessary for excessive light sensitivity.  8. Do no forcefully rub you your operative eye.  9. Keep your operative eye dry for 1 week. You may gently clean your eyelids with a damp washcloth.  10. You may resume normal occupational activities in one week and resume driving as tolerated after the first post operative visit.  11. It is normal to have blurred vision and a scratchy sensation following surgery.  Dr. Iona Hansen: [phone] A cataract is a clouding of the lens of the eye. When a lens becomes cloudy, vision is reduced based on the degree and nature of the clouding. Many cataracts reduce vision to some degree. Some cataracts make people more near-sighted as they develop. Other cataracts increase glare. Cataracts that are ignored and become worse can sometimes look white. The white color can be seen through the pupil. CAUSES   Aging. However, cataracts may occur at any age, even in newborns.   Certain drugs.   Trauma to the eye.    Certain diseases such as diabetes.   Specific eye diseases such as chronic inflammation inside the eye or a sudden attack of a rare form of glaucoma.   Inherited or acquired medical problems.  SYMPTOMS   Gradual, progressive drop in vision in the affected eye.   Severe, rapid visual loss. This most often happens when trauma is the cause.  DIAGNOSIS  To detect a cataract, an eye doctor examines the lens. Cataracts are best diagnosed with an exam of the eyes with the pupils enlarged (dilated) by drops.  TREATMENT  For an early cataract, vision may improve by using different eyeglasses or stronger lighting. If that does not help your vision, surgery is the only effective treatment. A cataract needs to be surgically removed when vision loss interferes with your everyday activities, such as driving, reading, or watching TV. A cataract may also have to be removed if it prevents examination or treatment of another eye problem. Surgery removes the cloudy lens and usually replaces it with a substitute lens (intraocular lens, IOL).  At a time when both you and your doctor agree, the cataract will be surgically removed. If you have cataracts in both eyes, only one is usually removed at a time. This allows the operated eye to heal and be out of danger from any possible problems after surgery (such as infection or poor wound healing). In rare cases, a cataract may be doing damage to your eye. In these cases, your caregiver may advise surgical removal right away. The vast majority  of people who have cataract surgery have better vision afterward. HOME CARE INSTRUCTIONS  If you are not planning surgery, you may be asked to do the following:  Use different eyeglasses.   Use stronger or brighter lighting.   Ask your eye doctor about reducing your medicine dose or changing medicines if it is thought that a medicine caused your cataract. Changing medicines does not make the cataract go away on its own.    Become familiar with your surroundings. Poor vision can lead to injury. Avoid bumping into things on the affected side. You are at a higher risk for tripping or falling.   Exercise extreme care when driving or operating machinery.   Wear sunglasses if you are sensitive to bright light or experiencing problems with glare.  SEEK IMMEDIATE MEDICAL CARE IF:   You have a worsening or sudden vision loss.   You notice redness, swelling, or increasing pain in the eye.   You have a fever.  Document Released: 08/29/2005 Document Revised: 08/18/2011 Document Reviewed: 04/22/2011 [facility] Patient Information 2012 [place].

## 2011-11-28 NOTE — Transfer of Care (Signed)
Immediate Anesthesia Transfer of Care Note  Patient: [name]  Procedure(s) Performed: Procedure(s) (LRB): CATARACT EXTRACTION PHACO AND INTRAOCULAR LENS PLACEMENT (IOC) (Right)  Patient Location: Shortstay  Anesthesia Type: MAC  Level of Consciousness: awake  Airway & Oxygen Therapy: Patient Spontanous Breathing   Post-op Assessment: Report given to PACU RN, Post -op Vital signs reviewed and stable and Patient moving all extremities  Post vital signs: Reviewed and stable  Complications: No apparent anesthesia complications

## 2011-11-28 NOTE — Anesthesia Preprocedure Evaluation (Signed)
Anesthesia Evaluation  Patient identified by MRN, date of birth, ID band Patient awake    Reviewed: Allergy & Precautions, H&P , NPO status , reviewed documented beta blocker date and time   History of Anesthesia Complications (+) PONV  Airway Mallampati: I      Dental  (+) Partial Upper and Partial Lower   Pulmonary asthma ,  breath sounds clear to auscultation        Cardiovascular hypertension, Pt. on medications + CAD and + CABG Rhythm:Regular Rate:Normal     Neuro/Psych Anxiety    GI/Hepatic   Endo/Other    Renal/GU      Musculoskeletal   Abdominal   Peds  Hematology   Anesthesia Other Findings   Reproductive/Obstetrics                           Anesthesia Physical Anesthesia Plan  ASA: III  Anesthesia Plan: MAC   Post-op Pain Management:    Induction: Intravenous  Airway Management Planned: Nasal Cannula  Additional Equipment:   Intra-op Plan:   Post-operative Plan:   Informed Consent: I have reviewed the patients History and Physical, chart, labs and discussed the procedure including the risks, benefits and alternatives for the proposed anesthesia with the patient or authorized representative who has indicated his/her understanding and acceptance.     Plan Discussed with:   Anesthesia Plan Comments:         Anesthesia Quick Evaluation

## 2011-11-29 ENCOUNTER — Encounter (HOSPITAL_COMMUNITY): Payer: Self-pay | Admitting: Ophthalmology

## 2012-10-05 ENCOUNTER — Ambulatory Visit (INDEPENDENT_AMBULATORY_CARE_PROVIDER_SITE_OTHER): Payer: Medicare Other | Admitting: Urology

## 2012-10-05 DIAGNOSIS — R972 Elevated prostate specific antigen [PSA]: Secondary | ICD-10-CM

## 2012-10-05 DIAGNOSIS — N471 Phimosis: Secondary | ICD-10-CM

## 2012-10-05 DIAGNOSIS — N401 Enlarged prostate with lower urinary tract symptoms: Secondary | ICD-10-CM

## 2013-02-15 ENCOUNTER — Ambulatory Visit (INDEPENDENT_AMBULATORY_CARE_PROVIDER_SITE_OTHER): Payer: Medicare Other | Admitting: Urology

## 2013-02-15 DIAGNOSIS — C61 Malignant neoplasm of prostate: Secondary | ICD-10-CM

## 2013-02-15 DIAGNOSIS — N401 Enlarged prostate with lower urinary tract symptoms: Secondary | ICD-10-CM

## 2013-05-24 ENCOUNTER — Ambulatory Visit (INDEPENDENT_AMBULATORY_CARE_PROVIDER_SITE_OTHER): Payer: Medicare Other | Admitting: Urology

## 2013-05-24 DIAGNOSIS — C61 Malignant neoplasm of prostate: Secondary | ICD-10-CM

## 2013-05-24 DIAGNOSIS — N401 Enlarged prostate with lower urinary tract symptoms: Secondary | ICD-10-CM

## 2013-08-23 ENCOUNTER — Ambulatory Visit (INDEPENDENT_AMBULATORY_CARE_PROVIDER_SITE_OTHER): Payer: Medicare Other | Admitting: Urology

## 2013-08-23 DIAGNOSIS — R972 Elevated prostate specific antigen [PSA]: Secondary | ICD-10-CM

## 2013-08-23 DIAGNOSIS — R82998 Other abnormal findings in urine: Secondary | ICD-10-CM

## 2013-08-23 DIAGNOSIS — N401 Enlarged prostate with lower urinary tract symptoms: Secondary | ICD-10-CM

## 2013-08-23 DIAGNOSIS — C61 Malignant neoplasm of prostate: Secondary | ICD-10-CM

## 2013-11-22 ENCOUNTER — Ambulatory Visit (INDEPENDENT_AMBULATORY_CARE_PROVIDER_SITE_OTHER): Payer: Medicare Other | Admitting: Urology

## 2013-11-22 DIAGNOSIS — N401 Enlarged prostate with lower urinary tract symptoms: Secondary | ICD-10-CM

## 2013-11-22 DIAGNOSIS — C61 Malignant neoplasm of prostate: Secondary | ICD-10-CM

## 2014-05-30 ENCOUNTER — Ambulatory Visit (INDEPENDENT_AMBULATORY_CARE_PROVIDER_SITE_OTHER): Payer: Medicare Other | Admitting: Urology

## 2014-05-30 DIAGNOSIS — N401 Enlarged prostate with lower urinary tract symptoms: Secondary | ICD-10-CM

## 2014-05-30 DIAGNOSIS — N471 Phimosis: Secondary | ICD-10-CM

## 2014-05-30 DIAGNOSIS — C61 Malignant neoplasm of prostate: Secondary | ICD-10-CM

## 2014-05-30 DIAGNOSIS — R972 Elevated prostate specific antigen [PSA]: Secondary | ICD-10-CM

## 2014-05-30 DIAGNOSIS — N478 Other disorders of prepuce: Secondary | ICD-10-CM

## 2014-10-31 DIAGNOSIS — M109 Gout, unspecified: Secondary | ICD-10-CM | POA: Diagnosis not present

## 2014-10-31 DIAGNOSIS — F419 Anxiety disorder, unspecified: Secondary | ICD-10-CM | POA: Diagnosis not present

## 2014-10-31 DIAGNOSIS — Z6830 Body mass index (BMI) 30.0-30.9, adult: Secondary | ICD-10-CM | POA: Diagnosis not present

## 2014-12-22 DIAGNOSIS — C61 Malignant neoplasm of prostate: Secondary | ICD-10-CM | POA: Diagnosis not present

## 2014-12-26 ENCOUNTER — Ambulatory Visit (INDEPENDENT_AMBULATORY_CARE_PROVIDER_SITE_OTHER): Payer: Medicare Other | Admitting: Urology

## 2014-12-26 DIAGNOSIS — R972 Elevated prostate specific antigen [PSA]: Secondary | ICD-10-CM | POA: Diagnosis not present

## 2014-12-26 DIAGNOSIS — C61 Malignant neoplasm of prostate: Secondary | ICD-10-CM

## 2014-12-26 DIAGNOSIS — N401 Enlarged prostate with lower urinary tract symptoms: Secondary | ICD-10-CM

## 2014-12-26 DIAGNOSIS — R351 Nocturia: Secondary | ICD-10-CM

## 2014-12-26 DIAGNOSIS — N138 Other obstructive and reflux uropathy: Secondary | ICD-10-CM | POA: Diagnosis not present

## 2015-04-08 DIAGNOSIS — E1129 Type 2 diabetes mellitus with other diabetic kidney complication: Secondary | ICD-10-CM | POA: Diagnosis not present

## 2015-04-08 DIAGNOSIS — I10 Essential (primary) hypertension: Secondary | ICD-10-CM | POA: Diagnosis not present

## 2015-04-08 DIAGNOSIS — Z0001 Encounter for general adult medical examination with abnormal findings: Secondary | ICD-10-CM | POA: Diagnosis not present

## 2015-04-08 DIAGNOSIS — Z1389 Encounter for screening for other disorder: Secondary | ICD-10-CM | POA: Diagnosis not present

## 2015-04-08 DIAGNOSIS — Z23 Encounter for immunization: Secondary | ICD-10-CM | POA: Diagnosis not present

## 2015-04-08 DIAGNOSIS — M1991 Primary osteoarthritis, unspecified site: Secondary | ICD-10-CM | POA: Diagnosis not present

## 2015-04-08 DIAGNOSIS — Z6830 Body mass index (BMI) 30.0-30.9, adult: Secondary | ICD-10-CM | POA: Diagnosis not present

## 2015-05-28 DIAGNOSIS — I10 Essential (primary) hypertension: Secondary | ICD-10-CM | POA: Diagnosis not present

## 2015-05-28 DIAGNOSIS — M109 Gout, unspecified: Secondary | ICD-10-CM | POA: Diagnosis not present

## 2015-05-28 DIAGNOSIS — N183 Chronic kidney disease, stage 3 (moderate): Secondary | ICD-10-CM | POA: Diagnosis not present

## 2015-05-28 DIAGNOSIS — E785 Hyperlipidemia, unspecified: Secondary | ICD-10-CM | POA: Diagnosis not present

## 2015-06-05 ENCOUNTER — Other Ambulatory Visit (HOSPITAL_COMMUNITY): Payer: Self-pay | Admitting: Nephrology

## 2015-06-05 DIAGNOSIS — N183 Chronic kidney disease, stage 3 unspecified: Secondary | ICD-10-CM

## 2015-06-12 ENCOUNTER — Ambulatory Visit (HOSPITAL_COMMUNITY)
Admission: RE | Admit: 2015-06-12 | Discharge: 2015-06-12 | Disposition: A | Discharge status: Home / Self Care | Payer: Medicare Other | Source: Ambulatory Visit | Attending: Nephrology | Admitting: Nephrology

## 2015-06-12 DIAGNOSIS — N281 Cyst of kidney, acquired: Secondary | ICD-10-CM | POA: Insufficient documentation

## 2015-06-12 DIAGNOSIS — N183 Chronic kidney disease, stage 3 unspecified: Secondary | ICD-10-CM

## 2015-06-29 DIAGNOSIS — C61 Malignant neoplasm of prostate: Secondary | ICD-10-CM | POA: Diagnosis not present

## 2015-07-02 DIAGNOSIS — E785 Hyperlipidemia, unspecified: Secondary | ICD-10-CM | POA: Diagnosis not present

## 2015-07-02 DIAGNOSIS — E1122 Type 2 diabetes mellitus with diabetic chronic kidney disease: Secondary | ICD-10-CM | POA: Diagnosis not present

## 2015-07-02 DIAGNOSIS — N2581 Secondary hyperparathyroidism of renal origin: Secondary | ICD-10-CM | POA: Diagnosis not present

## 2015-07-02 DIAGNOSIS — I10 Essential (primary) hypertension: Secondary | ICD-10-CM | POA: Diagnosis not present

## 2015-07-02 DIAGNOSIS — N183 Chronic kidney disease, stage 3 (moderate): Secondary | ICD-10-CM | POA: Diagnosis not present

## 2015-07-03 ENCOUNTER — Ambulatory Visit (INDEPENDENT_AMBULATORY_CARE_PROVIDER_SITE_OTHER): Payer: Medicare Other | Admitting: Urology

## 2015-07-03 DIAGNOSIS — C61 Malignant neoplasm of prostate: Secondary | ICD-10-CM | POA: Diagnosis not present

## 2015-07-03 DIAGNOSIS — R351 Nocturia: Secondary | ICD-10-CM

## 2015-07-03 DIAGNOSIS — N471 Phimosis: Secondary | ICD-10-CM

## 2015-07-03 DIAGNOSIS — N401 Enlarged prostate with lower urinary tract symptoms: Secondary | ICD-10-CM

## 2015-07-03 DIAGNOSIS — R972 Elevated prostate specific antigen [PSA]: Secondary | ICD-10-CM

## 2015-08-31 DIAGNOSIS — E119 Type 2 diabetes mellitus without complications: Secondary | ICD-10-CM | POA: Diagnosis not present

## 2015-08-31 DIAGNOSIS — M1991 Primary osteoarthritis, unspecified site: Secondary | ICD-10-CM | POA: Diagnosis not present

## 2015-08-31 DIAGNOSIS — Z6830 Body mass index (BMI) 30.0-30.9, adult: Secondary | ICD-10-CM | POA: Diagnosis not present

## 2015-08-31 DIAGNOSIS — I10 Essential (primary) hypertension: Secondary | ICD-10-CM | POA: Diagnosis not present

## 2015-08-31 DIAGNOSIS — Z1389 Encounter for screening for other disorder: Secondary | ICD-10-CM | POA: Diagnosis not present

## 2015-10-01 DIAGNOSIS — N183 Chronic kidney disease, stage 3 (moderate): Secondary | ICD-10-CM | POA: Diagnosis not present

## 2015-10-01 DIAGNOSIS — E1122 Type 2 diabetes mellitus with diabetic chronic kidney disease: Secondary | ICD-10-CM | POA: Diagnosis not present

## 2015-10-01 DIAGNOSIS — N2581 Secondary hyperparathyroidism of renal origin: Secondary | ICD-10-CM | POA: Diagnosis not present

## 2015-10-01 DIAGNOSIS — E871 Hypo-osmolality and hyponatremia: Secondary | ICD-10-CM | POA: Diagnosis not present

## 2015-10-01 DIAGNOSIS — I129 Hypertensive chronic kidney disease with stage 1 through stage 4 chronic kidney disease, or unspecified chronic kidney disease: Secondary | ICD-10-CM | POA: Diagnosis not present

## 2015-11-30 DIAGNOSIS — E119 Type 2 diabetes mellitus without complications: Secondary | ICD-10-CM | POA: Diagnosis not present

## 2015-11-30 DIAGNOSIS — R600 Localized edema: Secondary | ICD-10-CM | POA: Diagnosis not present

## 2015-11-30 DIAGNOSIS — E1129 Type 2 diabetes mellitus with other diabetic kidney complication: Secondary | ICD-10-CM | POA: Diagnosis not present

## 2015-11-30 DIAGNOSIS — I10 Essential (primary) hypertension: Secondary | ICD-10-CM | POA: Diagnosis not present

## 2015-11-30 DIAGNOSIS — C61 Malignant neoplasm of prostate: Secondary | ICD-10-CM | POA: Diagnosis not present

## 2015-12-28 DIAGNOSIS — C61 Malignant neoplasm of prostate: Secondary | ICD-10-CM | POA: Diagnosis not present

## 2016-01-01 ENCOUNTER — Ambulatory Visit (INDEPENDENT_AMBULATORY_CARE_PROVIDER_SITE_OTHER): Payer: Medicare Other | Admitting: Urology

## 2016-01-01 DIAGNOSIS — R972 Elevated prostate specific antigen [PSA]: Secondary | ICD-10-CM | POA: Diagnosis not present

## 2016-01-01 DIAGNOSIS — R351 Nocturia: Secondary | ICD-10-CM | POA: Diagnosis not present

## 2016-01-01 DIAGNOSIS — C61 Malignant neoplasm of prostate: Secondary | ICD-10-CM | POA: Diagnosis not present

## 2016-01-01 DIAGNOSIS — R3121 Asymptomatic microscopic hematuria: Secondary | ICD-10-CM

## 2016-01-01 DIAGNOSIS — N401 Enlarged prostate with lower urinary tract symptoms: Secondary | ICD-10-CM | POA: Diagnosis not present

## 2016-01-28 DIAGNOSIS — E871 Hypo-osmolality and hyponatremia: Secondary | ICD-10-CM | POA: Diagnosis not present

## 2016-01-28 DIAGNOSIS — I129 Hypertensive chronic kidney disease with stage 1 through stage 4 chronic kidney disease, or unspecified chronic kidney disease: Secondary | ICD-10-CM | POA: Diagnosis not present

## 2016-01-28 DIAGNOSIS — E1122 Type 2 diabetes mellitus with diabetic chronic kidney disease: Secondary | ICD-10-CM | POA: Diagnosis not present

## 2016-01-28 DIAGNOSIS — N183 Chronic kidney disease, stage 3 (moderate): Secondary | ICD-10-CM | POA: Diagnosis not present

## 2016-01-28 DIAGNOSIS — N2581 Secondary hyperparathyroidism of renal origin: Secondary | ICD-10-CM | POA: Diagnosis not present

## 2016-02-01 DIAGNOSIS — T148 Other injury of unspecified body region: Secondary | ICD-10-CM | POA: Diagnosis not present

## 2016-02-01 DIAGNOSIS — Z1389 Encounter for screening for other disorder: Secondary | ICD-10-CM | POA: Diagnosis not present

## 2016-02-01 DIAGNOSIS — I10 Essential (primary) hypertension: Secondary | ICD-10-CM | POA: Diagnosis not present

## 2016-02-01 DIAGNOSIS — E119 Type 2 diabetes mellitus without complications: Secondary | ICD-10-CM | POA: Diagnosis not present

## 2016-02-23 DIAGNOSIS — H538 Other visual disturbances: Secondary | ICD-10-CM | POA: Diagnosis not present

## 2016-02-23 DIAGNOSIS — Z961 Presence of intraocular lens: Secondary | ICD-10-CM | POA: Diagnosis not present

## 2016-03-18 DIAGNOSIS — Z6829 Body mass index (BMI) 29.0-29.9, adult: Secondary | ICD-10-CM | POA: Diagnosis not present

## 2016-03-18 DIAGNOSIS — Z1389 Encounter for screening for other disorder: Secondary | ICD-10-CM | POA: Diagnosis not present

## 2016-03-18 DIAGNOSIS — F419 Anxiety disorder, unspecified: Secondary | ICD-10-CM | POA: Diagnosis not present

## 2016-07-04 DIAGNOSIS — C61 Malignant neoplasm of prostate: Secondary | ICD-10-CM | POA: Diagnosis not present

## 2016-07-08 ENCOUNTER — Ambulatory Visit (INDEPENDENT_AMBULATORY_CARE_PROVIDER_SITE_OTHER): Payer: Medicare Other | Admitting: Urology

## 2016-07-08 ENCOUNTER — Other Ambulatory Visit (HOSPITAL_COMMUNITY)
Admission: RE | Admit: 2016-07-08 | Discharge: 2016-07-08 | Disposition: A | Discharge status: Home / Self Care | Payer: Medicare Other | Source: Other Acute Inpatient Hospital | Attending: Urology | Admitting: Urology

## 2016-07-08 DIAGNOSIS — N471 Phimosis: Secondary | ICD-10-CM

## 2016-07-08 DIAGNOSIS — R3121 Asymptomatic microscopic hematuria: Secondary | ICD-10-CM | POA: Insufficient documentation

## 2016-07-08 DIAGNOSIS — C61 Malignant neoplasm of prostate: Secondary | ICD-10-CM | POA: Diagnosis not present

## 2016-07-08 LAB — URINALYSIS, ROUTINE W REFLEX MICROSCOPIC
Bilirubin Urine: NEGATIVE
Glucose, UA: NEGATIVE mg/dL
Ketones, ur: NEGATIVE mg/dL
Nitrite: NEGATIVE
PROTEIN: NEGATIVE mg/dL
Specific Gravity, Urine: 1.01 (ref 1.005–1.030)
pH: 6 (ref 5.0–8.0)

## 2016-07-08 LAB — URINE MICROSCOPIC-ADD ON
BACTERIA UA: NONE SEEN
SQUAMOUS EPITHELIAL / LPF: NONE SEEN

## 2016-08-31 DIAGNOSIS — N2581 Secondary hyperparathyroidism of renal origin: Secondary | ICD-10-CM | POA: Diagnosis not present

## 2016-08-31 DIAGNOSIS — N183 Chronic kidney disease, stage 3 (moderate): Secondary | ICD-10-CM | POA: Diagnosis not present

## 2016-08-31 DIAGNOSIS — R809 Proteinuria, unspecified: Secondary | ICD-10-CM | POA: Diagnosis not present

## 2016-08-31 DIAGNOSIS — E1122 Type 2 diabetes mellitus with diabetic chronic kidney disease: Secondary | ICD-10-CM | POA: Diagnosis not present

## 2016-08-31 DIAGNOSIS — I129 Hypertensive chronic kidney disease with stage 1 through stage 4 chronic kidney disease, or unspecified chronic kidney disease: Secondary | ICD-10-CM | POA: Diagnosis not present

## 2016-09-14 DIAGNOSIS — Z1389 Encounter for screening for other disorder: Secondary | ICD-10-CM | POA: Diagnosis not present

## 2016-09-14 DIAGNOSIS — C61 Malignant neoplasm of prostate: Secondary | ICD-10-CM | POA: Diagnosis not present

## 2016-09-14 DIAGNOSIS — N183 Chronic kidney disease, stage 3 (moderate): Secondary | ICD-10-CM | POA: Diagnosis not present

## 2016-09-14 DIAGNOSIS — E1129 Type 2 diabetes mellitus with other diabetic kidney complication: Secondary | ICD-10-CM | POA: Diagnosis not present

## 2017-01-30 DIAGNOSIS — R3121 Asymptomatic microscopic hematuria: Secondary | ICD-10-CM | POA: Diagnosis not present

## 2017-02-03 ENCOUNTER — Ambulatory Visit (INDEPENDENT_AMBULATORY_CARE_PROVIDER_SITE_OTHER): Payer: Medicare Other | Admitting: Urology

## 2017-02-03 DIAGNOSIS — N5201 Erectile dysfunction due to arterial insufficiency: Secondary | ICD-10-CM | POA: Diagnosis not present

## 2017-02-03 DIAGNOSIS — N471 Phimosis: Secondary | ICD-10-CM

## 2017-02-03 DIAGNOSIS — C61 Malignant neoplasm of prostate: Secondary | ICD-10-CM | POA: Diagnosis not present

## 2017-03-09 DIAGNOSIS — N183 Chronic kidney disease, stage 3 (moderate): Secondary | ICD-10-CM | POA: Diagnosis not present

## 2017-03-09 DIAGNOSIS — E1122 Type 2 diabetes mellitus with diabetic chronic kidney disease: Secondary | ICD-10-CM | POA: Diagnosis not present

## 2017-03-09 DIAGNOSIS — M109 Gout, unspecified: Secondary | ICD-10-CM | POA: Diagnosis not present

## 2017-03-09 DIAGNOSIS — I129 Hypertensive chronic kidney disease with stage 1 through stage 4 chronic kidney disease, or unspecified chronic kidney disease: Secondary | ICD-10-CM | POA: Diagnosis not present

## 2017-03-09 DIAGNOSIS — N2581 Secondary hyperparathyroidism of renal origin: Secondary | ICD-10-CM | POA: Diagnosis not present

## 2017-04-14 DIAGNOSIS — Z1389 Encounter for screening for other disorder: Secondary | ICD-10-CM | POA: Diagnosis not present

## 2017-04-14 DIAGNOSIS — E119 Type 2 diabetes mellitus without complications: Secondary | ICD-10-CM | POA: Diagnosis not present

## 2017-04-14 DIAGNOSIS — M1991 Primary osteoarthritis, unspecified site: Secondary | ICD-10-CM | POA: Diagnosis not present

## 2017-04-14 DIAGNOSIS — D649 Anemia, unspecified: Secondary | ICD-10-CM | POA: Diagnosis not present

## 2017-04-20 DIAGNOSIS — Z1211 Encounter for screening for malignant neoplasm of colon: Secondary | ICD-10-CM | POA: Diagnosis not present

## 2017-08-14 DIAGNOSIS — C61 Malignant neoplasm of prostate: Secondary | ICD-10-CM | POA: Diagnosis not present

## 2017-08-18 ENCOUNTER — Ambulatory Visit: Payer: Medicare Other | Admitting: Urology

## 2017-08-18 DIAGNOSIS — C61 Malignant neoplasm of prostate: Secondary | ICD-10-CM

## 2017-08-18 DIAGNOSIS — N5201 Erectile dysfunction due to arterial insufficiency: Secondary | ICD-10-CM

## 2017-08-18 DIAGNOSIS — R972 Elevated prostate specific antigen [PSA]: Secondary | ICD-10-CM | POA: Diagnosis not present

## 2017-08-18 DIAGNOSIS — N471 Phimosis: Secondary | ICD-10-CM | POA: Diagnosis not present

## 2017-09-07 DIAGNOSIS — E1122 Type 2 diabetes mellitus with diabetic chronic kidney disease: Secondary | ICD-10-CM | POA: Diagnosis not present

## 2017-09-07 DIAGNOSIS — N183 Chronic kidney disease, stage 3 (moderate): Secondary | ICD-10-CM | POA: Diagnosis not present

## 2017-09-07 DIAGNOSIS — N2581 Secondary hyperparathyroidism of renal origin: Secondary | ICD-10-CM | POA: Diagnosis not present

## 2017-09-07 DIAGNOSIS — I10 Essential (primary) hypertension: Secondary | ICD-10-CM | POA: Diagnosis not present

## 2017-09-07 DIAGNOSIS — I129 Hypertensive chronic kidney disease with stage 1 through stage 4 chronic kidney disease, or unspecified chronic kidney disease: Secondary | ICD-10-CM | POA: Diagnosis not present

## 2017-09-07 DIAGNOSIS — E785 Hyperlipidemia, unspecified: Secondary | ICD-10-CM | POA: Diagnosis not present

## 2017-09-07 DIAGNOSIS — M109 Gout, unspecified: Secondary | ICD-10-CM | POA: Diagnosis not present

## 2017-09-28 DIAGNOSIS — Z1389 Encounter for screening for other disorder: Secondary | ICD-10-CM | POA: Diagnosis not present

## 2017-09-28 DIAGNOSIS — I10 Essential (primary) hypertension: Secondary | ICD-10-CM | POA: Diagnosis not present

## 2017-09-28 DIAGNOSIS — Z0001 Encounter for general adult medical examination with abnormal findings: Secondary | ICD-10-CM | POA: Diagnosis not present

## 2017-09-28 DIAGNOSIS — Z0000 Encounter for general adult medical examination without abnormal findings: Secondary | ICD-10-CM | POA: Diagnosis not present

## 2017-09-28 DIAGNOSIS — E782 Mixed hyperlipidemia: Secondary | ICD-10-CM | POA: Diagnosis not present

## 2017-09-28 DIAGNOSIS — E1129 Type 2 diabetes mellitus with other diabetic kidney complication: Secondary | ICD-10-CM | POA: Diagnosis not present

## 2017-09-28 DIAGNOSIS — J45909 Unspecified asthma, uncomplicated: Secondary | ICD-10-CM | POA: Diagnosis not present

## 2017-09-28 DIAGNOSIS — C61 Malignant neoplasm of prostate: Secondary | ICD-10-CM | POA: Diagnosis not present

## 2017-09-28 DIAGNOSIS — M159 Polyosteoarthritis, unspecified: Secondary | ICD-10-CM | POA: Diagnosis not present

## 2017-10-04 DIAGNOSIS — R972 Elevated prostate specific antigen [PSA]: Secondary | ICD-10-CM | POA: Diagnosis not present

## 2017-10-04 DIAGNOSIS — R300 Dysuria: Secondary | ICD-10-CM | POA: Diagnosis not present

## 2017-10-04 DIAGNOSIS — C61 Malignant neoplasm of prostate: Secondary | ICD-10-CM | POA: Diagnosis not present

## 2017-10-25 DIAGNOSIS — J019 Acute sinusitis, unspecified: Secondary | ICD-10-CM | POA: Diagnosis not present

## 2017-10-25 DIAGNOSIS — R05 Cough: Secondary | ICD-10-CM | POA: Diagnosis not present

## 2017-10-25 DIAGNOSIS — J3489 Other specified disorders of nose and nasal sinuses: Secondary | ICD-10-CM | POA: Diagnosis not present

## 2017-10-25 DIAGNOSIS — R509 Fever, unspecified: Secondary | ICD-10-CM | POA: Diagnosis not present

## 2017-11-04 DIAGNOSIS — R972 Elevated prostate specific antigen [PSA]: Secondary | ICD-10-CM | POA: Diagnosis not present

## 2017-11-24 ENCOUNTER — Ambulatory Visit: Payer: Medicare Other | Admitting: Urology

## 2017-11-24 DIAGNOSIS — C61 Malignant neoplasm of prostate: Secondary | ICD-10-CM | POA: Diagnosis not present

## 2017-11-24 DIAGNOSIS — N401 Enlarged prostate with lower urinary tract symptoms: Secondary | ICD-10-CM | POA: Diagnosis not present

## 2017-11-24 DIAGNOSIS — R351 Nocturia: Secondary | ICD-10-CM

## 2017-11-24 DIAGNOSIS — R972 Elevated prostate specific antigen [PSA]: Secondary | ICD-10-CM

## 2018-02-20 DIAGNOSIS — E1122 Type 2 diabetes mellitus with diabetic chronic kidney disease: Secondary | ICD-10-CM | POA: Diagnosis not present

## 2018-02-20 DIAGNOSIS — I129 Hypertensive chronic kidney disease with stage 1 through stage 4 chronic kidney disease, or unspecified chronic kidney disease: Secondary | ICD-10-CM | POA: Diagnosis not present

## 2018-02-20 DIAGNOSIS — M109 Gout, unspecified: Secondary | ICD-10-CM | POA: Diagnosis not present

## 2018-02-20 DIAGNOSIS — J129 Viral pneumonia, unspecified: Secondary | ICD-10-CM | POA: Diagnosis not present

## 2018-02-20 DIAGNOSIS — N183 Chronic kidney disease, stage 3 (moderate): Secondary | ICD-10-CM | POA: Diagnosis not present

## 2018-05-09 DIAGNOSIS — Z1389 Encounter for screening for other disorder: Secondary | ICD-10-CM | POA: Diagnosis not present

## 2018-05-09 DIAGNOSIS — R0602 Shortness of breath: Secondary | ICD-10-CM | POA: Diagnosis not present

## 2018-05-09 DIAGNOSIS — J45901 Unspecified asthma with (acute) exacerbation: Secondary | ICD-10-CM | POA: Diagnosis not present

## 2018-05-09 DIAGNOSIS — J019 Acute sinusitis, unspecified: Secondary | ICD-10-CM | POA: Diagnosis not present

## 2018-06-01 ENCOUNTER — Ambulatory Visit: Payer: Medicare Other | Admitting: Urology

## 2018-06-01 DIAGNOSIS — N471 Phimosis: Secondary | ICD-10-CM

## 2018-06-01 DIAGNOSIS — R972 Elevated prostate specific antigen [PSA]: Secondary | ICD-10-CM | POA: Diagnosis not present

## 2018-06-01 DIAGNOSIS — N5201 Erectile dysfunction due to arterial insufficiency: Secondary | ICD-10-CM

## 2018-06-01 DIAGNOSIS — C61 Malignant neoplasm of prostate: Secondary | ICD-10-CM | POA: Diagnosis not present

## 2018-08-28 DIAGNOSIS — I129 Hypertensive chronic kidney disease with stage 1 through stage 4 chronic kidney disease, or unspecified chronic kidney disease: Secondary | ICD-10-CM | POA: Diagnosis not present

## 2018-08-28 DIAGNOSIS — M109 Gout, unspecified: Secondary | ICD-10-CM | POA: Diagnosis not present

## 2018-08-28 DIAGNOSIS — N2581 Secondary hyperparathyroidism of renal origin: Secondary | ICD-10-CM | POA: Diagnosis not present

## 2018-08-28 DIAGNOSIS — N183 Chronic kidney disease, stage 3 (moderate): Secondary | ICD-10-CM | POA: Diagnosis not present

## 2018-08-28 DIAGNOSIS — E1122 Type 2 diabetes mellitus with diabetic chronic kidney disease: Secondary | ICD-10-CM | POA: Diagnosis not present

## 2018-11-06 DIAGNOSIS — Z0001 Encounter for general adult medical examination with abnormal findings: Secondary | ICD-10-CM | POA: Diagnosis not present

## 2018-11-06 DIAGNOSIS — E782 Mixed hyperlipidemia: Secondary | ICD-10-CM | POA: Diagnosis not present

## 2018-11-06 DIAGNOSIS — M109 Gout, unspecified: Secondary | ICD-10-CM | POA: Diagnosis not present

## 2018-11-06 DIAGNOSIS — N183 Chronic kidney disease, stage 3 (moderate): Secondary | ICD-10-CM | POA: Diagnosis not present

## 2018-11-06 DIAGNOSIS — E7849 Other hyperlipidemia: Secondary | ICD-10-CM | POA: Diagnosis not present

## 2018-11-06 DIAGNOSIS — Z23 Encounter for immunization: Secondary | ICD-10-CM | POA: Diagnosis not present

## 2018-11-06 DIAGNOSIS — E119 Type 2 diabetes mellitus without complications: Secondary | ICD-10-CM | POA: Diagnosis not present

## 2018-11-06 DIAGNOSIS — I10 Essential (primary) hypertension: Secondary | ICD-10-CM | POA: Diagnosis not present

## 2018-11-06 DIAGNOSIS — Z1389 Encounter for screening for other disorder: Secondary | ICD-10-CM | POA: Diagnosis not present

## 2018-11-30 ENCOUNTER — Ambulatory Visit: Payer: Medicare Other | Admitting: Urology

## 2019-05-28 DIAGNOSIS — N183 Chronic kidney disease, stage 3 (moderate): Secondary | ICD-10-CM | POA: Diagnosis not present

## 2019-05-28 DIAGNOSIS — E1122 Type 2 diabetes mellitus with diabetic chronic kidney disease: Secondary | ICD-10-CM | POA: Diagnosis not present

## 2019-05-28 DIAGNOSIS — I129 Hypertensive chronic kidney disease with stage 1 through stage 4 chronic kidney disease, or unspecified chronic kidney disease: Secondary | ICD-10-CM | POA: Diagnosis not present

## 2019-05-28 DIAGNOSIS — N2581 Secondary hyperparathyroidism of renal origin: Secondary | ICD-10-CM | POA: Diagnosis not present

## 2019-06-04 ENCOUNTER — Emergency Department (HOSPITAL_COMMUNITY): Payer: Medicare Other

## 2019-06-04 ENCOUNTER — Encounter (HOSPITAL_COMMUNITY): Payer: Self-pay | Admitting: Emergency Medicine

## 2019-06-04 ENCOUNTER — Other Ambulatory Visit: Payer: Self-pay

## 2019-06-04 ENCOUNTER — Emergency Department (HOSPITAL_COMMUNITY)
Admission: EM | Admit: 2019-06-04 | Discharge: 2019-06-05 | Disposition: A | Discharge status: Home / Self Care | Payer: Medicare Other | Attending: Emergency Medicine | Admitting: Emergency Medicine

## 2019-06-04 DIAGNOSIS — I2510 Atherosclerotic heart disease of native coronary artery without angina pectoris: Secondary | ICD-10-CM | POA: Insufficient documentation

## 2019-06-04 DIAGNOSIS — I10 Essential (primary) hypertension: Secondary | ICD-10-CM | POA: Diagnosis not present

## 2019-06-04 DIAGNOSIS — Y999 Unspecified external cause status: Secondary | ICD-10-CM | POA: Insufficient documentation

## 2019-06-04 DIAGNOSIS — I447 Left bundle-branch block, unspecified: Secondary | ICD-10-CM | POA: Diagnosis not present

## 2019-06-04 DIAGNOSIS — J45909 Unspecified asthma, uncomplicated: Secondary | ICD-10-CM | POA: Diagnosis not present

## 2019-06-04 DIAGNOSIS — Y92009 Unspecified place in unspecified non-institutional (private) residence as the place of occurrence of the external cause: Secondary | ICD-10-CM | POA: Insufficient documentation

## 2019-06-04 DIAGNOSIS — S0990XA Unspecified injury of head, initial encounter: Secondary | ICD-10-CM | POA: Diagnosis not present

## 2019-06-04 DIAGNOSIS — K209 Esophagitis, unspecified without bleeding: Secondary | ICD-10-CM

## 2019-06-04 DIAGNOSIS — R1033 Periumbilical pain: Secondary | ICD-10-CM | POA: Diagnosis not present

## 2019-06-04 DIAGNOSIS — E119 Type 2 diabetes mellitus without complications: Secondary | ICD-10-CM | POA: Insufficient documentation

## 2019-06-04 DIAGNOSIS — S59902A Unspecified injury of left elbow, initial encounter: Secondary | ICD-10-CM | POA: Diagnosis not present

## 2019-06-04 DIAGNOSIS — S199XXA Unspecified injury of neck, initial encounter: Secondary | ICD-10-CM | POA: Diagnosis not present

## 2019-06-04 DIAGNOSIS — W010XXA Fall on same level from slipping, tripping and stumbling without subsequent striking against object, initial encounter: Secondary | ICD-10-CM | POA: Insufficient documentation

## 2019-06-04 DIAGNOSIS — S299XXA Unspecified injury of thorax, initial encounter: Secondary | ICD-10-CM | POA: Diagnosis not present

## 2019-06-04 DIAGNOSIS — Z79899 Other long term (current) drug therapy: Secondary | ICD-10-CM | POA: Diagnosis not present

## 2019-06-04 DIAGNOSIS — Z7984 Long term (current) use of oral hypoglycemic drugs: Secondary | ICD-10-CM | POA: Insufficient documentation

## 2019-06-04 DIAGNOSIS — Y9389 Activity, other specified: Secondary | ICD-10-CM | POA: Insufficient documentation

## 2019-06-04 DIAGNOSIS — W19XXXA Unspecified fall, initial encounter: Secondary | ICD-10-CM

## 2019-06-04 DIAGNOSIS — Z7982 Long term (current) use of aspirin: Secondary | ICD-10-CM | POA: Diagnosis not present

## 2019-06-04 DIAGNOSIS — K8020 Calculus of gallbladder without cholecystitis without obstruction: Secondary | ICD-10-CM | POA: Diagnosis not present

## 2019-06-04 DIAGNOSIS — R112 Nausea with vomiting, unspecified: Secondary | ICD-10-CM | POA: Diagnosis present

## 2019-06-04 HISTORY — DX: Type 2 diabetes mellitus without complications: E11.9

## 2019-06-04 LAB — COMPREHENSIVE METABOLIC PANEL
ALT: 13 U/L (ref 0–44)
AST: 18 U/L (ref 15–41)
Albumin: 4.2 g/dL (ref 3.5–5.0)
Alkaline Phosphatase: 55 U/L (ref 38–126)
Anion gap: 10 (ref 5–15)
BUN: 31 mg/dL — ABNORMAL HIGH (ref 8–23)
CO2: 25 mmol/L (ref 22–32)
Calcium: 10.2 mg/dL (ref 8.9–10.3)
Chloride: 103 mmol/L (ref 98–111)
Creatinine, Ser: 1.68 mg/dL — ABNORMAL HIGH (ref 0.61–1.24)
GFR calc Af Amer: 44 mL/min — ABNORMAL LOW (ref >60)
GFR calc non Af Amer: 38 mL/min — ABNORMAL LOW (ref >60)
Glucose, Bld: 201 mg/dL — ABNORMAL HIGH (ref 70–99)
Potassium: 3.7 mmol/L (ref 3.5–5.1)
Sodium: 138 mmol/L (ref 135–145)
Total Bilirubin: 0.6 mg/dL (ref 0.3–1.2)
Total Protein: 8.1 g/dL (ref 6.5–8.1)

## 2019-06-04 LAB — CBC
HCT: 38.6 % — ABNORMAL LOW (ref 39.0–52.0)
Hemoglobin: 12.3 g/dL — ABNORMAL LOW (ref 13.0–17.0)
MCH: 28.8 pg (ref 26.0–34.0)
MCHC: 31.9 g/dL (ref 30.0–36.0)
MCV: 90.4 fL (ref 80.0–100.0)
Platelets: 310 10*3/uL (ref 150–400)
RBC: 4.27 MIL/uL (ref 4.22–5.81)
RDW: 15.3 % (ref 11.5–15.5)
WBC: 9 10*3/uL (ref 4.0–10.5)
nRBC: 0 % (ref 0.0–0.2)

## 2019-06-04 LAB — CBG MONITORING, ED: Glucose-Capillary: 192 mg/dL — ABNORMAL HIGH (ref 70–99)

## 2019-06-04 LAB — URINALYSIS, ROUTINE W REFLEX MICROSCOPIC
Bacteria, UA: NONE SEEN
Bilirubin Urine: NEGATIVE
Glucose, UA: NEGATIVE mg/dL
Hgb urine dipstick: NEGATIVE
Ketones, ur: NEGATIVE mg/dL
Leukocytes,Ua: NEGATIVE
Nitrite: NEGATIVE
Protein, ur: 30 mg/dL — AB
Specific Gravity, Urine: 1.03 (ref 1.005–1.030)
pH: 5 (ref 5.0–8.0)

## 2019-06-04 LAB — CK: Total CK: 67 U/L (ref 49–397)

## 2019-06-04 LAB — LIPASE, BLOOD: Lipase: 20 U/L (ref 11–51)

## 2019-06-04 LAB — LACTIC ACID, PLASMA: Lactic Acid, Venous: 1.1 mmol/L (ref 0.5–1.9)

## 2019-06-04 MED ORDER — ONDANSETRON HCL 4 MG/2ML IJ SOLN
2.0000 mg | Freq: Once | INTRAMUSCULAR | Status: AC
  Administered 2019-06-04: 19:00:00 2 mg via INTRAVENOUS
  Filled 2019-06-04: qty 2

## 2019-06-04 MED ORDER — MORPHINE SULFATE (PF) 2 MG/ML IV SOLN
2.0000 mg | Freq: Once | INTRAVENOUS | Status: AC
  Administered 2019-06-04: 2 mg via INTRAVENOUS
  Filled 2019-06-04: qty 1

## 2019-06-04 MED ORDER — SODIUM CHLORIDE 0.9% FLUSH: 3.0000 mL | Freq: Once | INTRAVENOUS | Status: DC

## 2019-06-04 MED ORDER — IOHEXOL 300 MG/ML  SOLN
75.0000 mL | Freq: Once | INTRAMUSCULAR | Status: AC | PRN
  Administered 2019-06-04: 19:00:00 75 mL via INTRAVENOUS

## 2019-06-04 MED ORDER — FAMOTIDINE IN NACL 20-0.9 MG/50ML-% IV SOLN
20.0000 mg | Freq: Once | INTRAVENOUS | Status: AC
  Administered 2019-06-04: 20 mg via INTRAVENOUS
  Filled 2019-06-04: qty 50

## 2019-06-04 MED ORDER — MORPHINE SULFATE (PF) 2 MG/ML IV SOLN
2.0000 mg | Freq: Once | INTRAVENOUS | Status: AC
  Administered 2019-06-04: 19:00:00 2 mg via INTRAVENOUS
  Filled 2019-06-04: qty 1

## 2019-06-04 MED ORDER — SODIUM CHLORIDE 0.9 % IV BOLUS
500.0000 mL | Freq: Once | INTRAVENOUS | Status: AC
  Administered 2019-06-04: 19:00:00 500 mL via INTRAVENOUS

## 2019-06-04 MED ORDER — ONDANSETRON HCL 4 MG/5ML PO SOLN
2.0000 mg | Freq: Once | ORAL | Status: AC
  Administered 2019-06-04: 2 mg via ORAL
  Filled 2019-06-04: qty 1

## 2019-06-04 NOTE — ED Notes (Signed)
Patient transported to CT 

## 2019-06-04 NOTE — ED Triage Notes (Signed)
Pt tripped and fell in yard on Saturday. Pt unsure if he hit his head. Wife reports pt has been more fatigued since incident. Pt began vomiting today. Pt vomiting in triage. Pt sent over by Dr. Gerarda Fraction.

## 2019-06-04 NOTE — ED Provider Notes (Signed)
[facility] EMERGENCY DEPARTMENT Provider Note   CSN: [id number] Arrival date & time: 06/04/19  1550     History   Chief Complaint Chief Complaint  Patient presents with  . Fall    HPI [name] is a 83 y.o. male with history of CAD, diabetes, gout, asthma, arthritis, hypertension presents today for fall, nausea/vomiting and abdominal pain.  Patient suffered a fall 3 days ago while at home.  He was attempting to change the battery on a vehicle when he fell, he was on the ground for approximately 3 hours prior to a neighbor finding him.  Neighbors helped patient off the ground however shortly after he fell a second time.  Patient denies any pain from his falls, head injury or loss of consciousness.  Patient does take 324 mg aspirin daily, no other blood thinner use.  Patient reports that he was feeling well after these falls until today.  He reports that he began having periumbilical abdominal pain 2-3 hours prior to arrival with 2 episodes of nonbloody/nonbilious emesis.  Abdominal pain is described as a moderate ache constant worsened with palpation no clear alleviating factors.  Patient's wife who is at bedside who was not present during the incident 3 days ago.  She reports that she has noticed the patient has been more tired appearing for the past several weeks however more so over the past 3 days.  She notes some bruising to his left side without other injuries.  No history of fever, headache/vision changes, loss of consciousness, neck pain, chest pain, back pain, shortness of breath, cough, dysuria/hematuria, numbness/weakness, tingling or any additional concerns.  Of note patient very hard of hearing, history obtained in conjunction with patient's wife.    HPI  Past Medical History:  Diagnosis Date  . Anxiety   . Arthritis   . Asthma   . Coronary artery disease   . Diabetes mellitus without complication ([place])   . Gout   . HOH (hard of hearing) bilateral  .  Hypertension   . PONV (postoperative nausea and vomiting)     There are no active problems to display for this patient.   Past Surgical History:  Procedure Laterality Date  . CATARACT EXTRACTION W/PHACO  11/28/2011   Procedure: CATARACT EXTRACTION PHACO AND INTRAOCULAR LENS PLACEMENT (IOC);  Surgeon: Williams Che, MD;  Location: [facility];  Service: Ophthalmology;  Laterality: Right;  CDE 3.84  . CORONARY ARTERY BYPASS GRAFT  1997   6 vessels  . EYE SURGERY  1995   left eye cataract removal   . TOTAL KNEE  PROSTHESIS REMOVAL W/ SPACER INSERTION  8 yrs ago   right        Home Medications    Prior to Admission medications   Medication Sig Start Date End Date Taking? Authorizing Provider  albuterol (VENTOLIN HFA) 108 (90 Base) MCG/ACT inhaler  03/05/19   [provider]  albuterol-ipratropium (COMBIVENT) 18-103 MCG/ACT inhaler Inhale 2 puffs into the lungs every 6 (six) hours as needed. For wheezing    [provider]  allopurinol (ZYLOPRIM) 300 MG tablet Take 300 mg by mouth daily.    [provider]  ALPRAZolam Duanne Moron) 0.5 MG tablet Take 0.5 mg by mouth 3 (three) times daily as needed. For anxiety    [provider]  amLODipine (NORVASC) 5 MG tablet  05/03/19   [provider]  aspirin EC 325 MG tablet Take 325 mg by mouth daily.    [provider]  atenolol (TENORMIN) 100 MG tablet Take 100 mg by mouth daily.    [provider]  atorvastatin (LIPITOR) 40 MG tablet Take 40 mg by mouth at bedtime.    [provider]  famotidine (PEPCID) 20 MG tablet Take 1 tablet (20 mg total) by mouth daily. 06/05/19   Nuala Alpha A, PA-C  furosemide (LASIX) 20 MG tablet  04/23/19   [provider]  glipiZIDE (GLUCOTROL) 5 MG tablet  05/03/19   [provider]  hydrochlorothiazide (HYDRODIURIL) 25 MG tablet Take 12.5 mg by mouth daily.    [provider]  lisinopril (ZESTRIL) 5 MG tablet  05/03/19    [provider]  Lutein 20 MG CAPS Take 1 capsule by mouth daily.    [provider]  naphazoline (CLEAR EYES) 0.012 % ophthalmic solution Place 1 drop into both eyes daily.    [provider]  naproxen sodium (ANAPROX) 220 MG tablet Take 220 mg by mouth every 8 (eight) hours as needed. For leg pain    [provider]  Omega-3 Fatty Acids (FISH OIL) 600 MG CAPS Take 1 capsule by mouth daily.    [provider]  ondansetron (ZOFRAN ODT) 4 MG disintegrating tablet Take 1 tablet (4 mg total) by mouth every 8 (eight) hours as needed for nausea or vomiting. 06/05/19   Nuala Alpha A, PA-C  Red Yeast Rice Extract (RED YEAST RICE PO) Take 1 capsule by mouth daily.    [provider]  theophylline (THEODUR) 300 MG 12 hr tablet Take 300 mg by mouth 2 (two) times daily.    [provider]  triamcinolone cream (KENALOG) 0.1 % Apply 1 application topically as needed. For face breakout    [provider]    Family History Family History  Problem Relation Age of Onset  . Anesthesia problems Neg Hx   . Hypotension Neg Hx   . Malignant hyperthermia Neg Hx   . Pseudochol deficiency Neg Hx     Social History Social History   Tobacco Use  . Smoking status: Never Smoker  . Smokeless tobacco: Never Used  Substance Use Topics  . Alcohol use: No  . Drug use: No     Allergies   Patient has no known allergies.   Review of Systems Review of Systems Ten systems are reviewed and are negative for acute change except as noted in the HPI   Physical Exam Updated Vital Signs BP (!) 172/80 (BP Location: Right Arm)   Pulse 76   Temp 98.4 F (36.9 C) (Oral)   Resp 15   Ht 5\' 10"  (1.778 m)   Wt 80 kg   SpO2 95%   BMI 25.31 kg/m   Physical Exam Constitutional:      General: He is not in acute distress.    Appearance: Normal appearance. He is well-developed. He is not ill-appearing or diaphoretic.  HENT:     Head:  Normocephalic and atraumatic.     Right Ear: External ear normal.     Left Ear: External ear normal.     Nose: Nose normal.  Eyes:     General: Vision grossly intact. Gaze aligned appropriately.     Pupils: Pupils are equal, round, and reactive to light.  Neck:     Musculoskeletal: Normal range of motion.     Trachea: Trachea and phonation normal. No tracheal deviation.  Pulmonary:     Effort: Pulmonary effort is normal. No respiratory distress.  Abdominal:  General: There is no distension.     Palpations: Abdomen is soft.     Tenderness: There is no abdominal tenderness. There is no guarding or rebound.  Musculoskeletal: Normal range of motion.     Comments: No midline C/T/L spinal tenderness to palpation, no paraspinal muscle tenderness, no deformity, crepitus, or step-off noted. No sign of injury to the neck or back. - Small bruise of the left elbow without deformity.  Neurovascular intact in all 4 extremities without pain with movement.  Chronic deformity of left arm.  Skin:    General: Skin is warm and dry.  Neurological:     Mental Status: He is alert.     GCS: GCS eye subscore is 4. GCS verbal subscore is 5. GCS motor subscore is 6.     Comments: Speech is clear and goal oriented, follows commands Major Cranial nerves without deficit, no facial droop Moves extremities without ataxia, coordination intact  Psychiatric:        Behavior: Behavior normal.      ED Treatments / Results  Labs (all labs ordered are listed, but only abnormal results are displayed) Labs Reviewed  COMPREHENSIVE METABOLIC PANEL - Abnormal; Notable for the following components:      Result Value   Glucose, Bld 201 (*)    BUN 31 (*)    Creatinine, Ser 1.68 (*)    GFR calc non Af Amer 38 (*)    GFR calc Af Amer 44 (*)    All other components within normal limits  CBC - Abnormal; Notable for the following components:   Hemoglobin 12.3 (*)    HCT 38.6 (*)    All other components within normal  limits  URINALYSIS, ROUTINE W REFLEX MICROSCOPIC - Abnormal; Notable for the following components:   Protein, ur 30 (*)    All other components within normal limits  CBG MONITORING, ED - Abnormal; Notable for the following components:   Glucose-Capillary 192 (*)    All other components within normal limits  LIPASE, BLOOD  LACTIC ACID, PLASMA  CK    EKG EKG Interpretation  Date/Time:  Tuesday June 04 2019 18:22:32 EDT Ventricular Rate:  75 PR Interval:    QRS Duration: 148 QT Interval:  409 QTC Calculation: 457 R Axis:   39 Text Interpretation:  Sinus rhythm Left bundle branch block Confirmed by Milton Ferguson [phone]) on 06/04/2019 8:27:33 PM   Radiology Dg Elbow Complete Left  Result Date: 06/04/2019 CLINICAL DATA:  83 year old male with fall. EXAM: LEFT ELBOW - COMPLETE 3+ VIEW COMPARISON:  None. FINDINGS: Evaluation is limited due to suboptimal patient positioning and absence of a true 90 degree lateral view. No definite acute fracture identified on provided images. The bones are osteopenic. There is no dislocation. No significant joint effusion. There is fatty atrophy of musculature. Vascular calcifications noted. The soft tissues are otherwise unremarkable. IMPRESSION: 1. No definite acute fracture or dislocation. No significant joint effusion. 2. Osteopenia. Electronically Signed   By: Anner Crete M.D.   On: 06/04/2019 21:34   Ct Head Wo Contrast  Result Date: 06/04/2019 CLINICAL DATA:  [place] today. EXAM: CT HEAD WITHOUT CONTRAST CT CERVICAL SPINE WITHOUT CONTRAST TECHNIQUE: Multidetector CT imaging of the head and cervical spine was performed following the standard protocol without intravenous contrast. Multiplanar CT image reconstructions of the cervical spine were also generated. COMPARISON:  None. FINDINGS: CT HEAD FINDINGS Brain: Moderate atrophy. Chronic lacunar infarction right basal ganglia. Patchy hypodensity in the cerebral white matter bilaterally asymmetric  in  the right parietal lobe most consistent with chronic ischemia. Negative for acute infarct, hemorrhage, or mass. Vascular: Negative for hyperdense vessel Skull: Negative Sinuses/Orbits: Mild mucosal edema paranasal sinuses. Bilateral cataract surgery Other: None CT CERVICAL SPINE FINDINGS Alignment: Normal alignment.  Straightening of the cervical lordosis Skull base and vertebrae: Negative for fracture Soft tissues and spinal canal: Negative for soft tissue mass. Extensive atherosclerotic calcification in the carotid arteries bilaterally. Disc levels: Multilevel disc degeneration and spondylosis. Mild facet degeneration. Multilevel spinal and foraminal stenosis. No areas of critical spinal stenosis. Upper chest: Lung apices clear bilaterally. Other: None IMPRESSION: Atrophy and chronic ischemic change. No acute intracranial abnormality Negative for cervical spine fracture.  Cervical spondylosis. Carotid artery disease. Electronically Signed   By: Franchot Gallo M.D.   On: 06/04/2019 19:41   Ct Cervical Spine Wo Contrast  Result Date: 06/04/2019 CLINICAL DATA:  [place] today. EXAM: CT HEAD WITHOUT CONTRAST CT CERVICAL SPINE WITHOUT CONTRAST TECHNIQUE: Multidetector CT imaging of the head and cervical spine was performed following the standard protocol without intravenous contrast. Multiplanar CT image reconstructions of the cervical spine were also generated. COMPARISON:  None. FINDINGS: CT HEAD FINDINGS Brain: Moderate atrophy. Chronic lacunar infarction right basal ganglia. Patchy hypodensity in the cerebral white matter bilaterally asymmetric in the right parietal lobe most consistent with chronic ischemia. Negative for acute infarct, hemorrhage, or mass. Vascular: Negative for hyperdense vessel Skull: Negative Sinuses/Orbits: Mild mucosal edema paranasal sinuses. Bilateral cataract surgery Other: None CT CERVICAL SPINE FINDINGS Alignment: Normal alignment.  Straightening of the cervical lordosis Skull base and  vertebrae: Negative for fracture Soft tissues and spinal canal: Negative for soft tissue mass. Extensive atherosclerotic calcification in the carotid arteries bilaterally. Disc levels: Multilevel disc degeneration and spondylosis. Mild facet degeneration. Multilevel spinal and foraminal stenosis. No areas of critical spinal stenosis. Upper chest: Lung apices clear bilaterally. Other: None IMPRESSION: Atrophy and chronic ischemic change. No acute intracranial abnormality Negative for cervical spine fracture.  Cervical spondylosis. Carotid artery disease. Electronically Signed   By: Franchot Gallo M.D.   On: 06/04/2019 19:41   Ct Abdomen Pelvis W Contrast  Result Date: 06/04/2019 CLINICAL DATA:  83 year old male with acute generalized abdominal pain. EXAM: CT ABDOMEN AND PELVIS WITH CONTRAST TECHNIQUE: Multidetector CT imaging of the abdomen and pelvis was performed using the standard protocol following bolus administration of intravenous contrast. CONTRAST:  68mL OMNIPAQUE IOHEXOL 300 MG/ML  SOLN COMPARISON:  Renal ultrasound dated 06/12/2015 FINDINGS: Lower chest: Partially visualized small right pleural effusion. Subsegmental bibasilar atelectasis versus infiltrate. Faint nodular density at the right lung base may represent developing infiltrate. Clinical correlation is recommended. There is multi vessel coronary vascular calcification and calcification of the visualized thoracic aorta. No intra-abdominal free air or free fluid. Hepatobiliary: The liver is unremarkable. No intrahepatic biliary ductal dilatation. Multiple stones noted in the proximal gallbladder. No pericholecystic fluid or evidence of acute cholecystitis by CT. Pancreas: Unremarkable. No pancreatic ductal dilatation or surrounding inflammatory changes. Spleen: Normal in size without focal abnormality. Adrenals/Urinary Tract: Small calcific foci in the left adrenal gland, likely sequela of prior insult. There is a 3 cm fat containing right  adrenal nodule most consistent with an adenoma versus a myelolipoma. Several small calcification within this nodule likely sequela of prior insult. There are multiple bilateral renal cysts measure up to 3.5 cm in the superior pole of the right kidney. Additional smaller hypodense lesions are not well characterized. There is no hydronephrosis on either side. There is symmetric enhancement and  excretion of contrast by both kidneys. The visualized ureters and urinary bladder appear unremarkable. Stomach/Bowel: There is diffuse thickening of distal esophagus with inflammatory changes which may represent esophagitis. An infiltrative esophageal neoplasm is not entirely excluded but favored less likely. Clinical correlation and follow-up recommended. There is sigmoid diverticulosis without active inflammatory changes. Moderate stool noted throughout the colon. There is no bowel obstruction. The appendix is normal. Vascular/Lymphatic: Moderate aortoiliac atherosclerotic disease. The IVC is unremarkable. No portal venous gas. No adenopathy. Top-normal passive visual lymph node measures approximately 7 mm in short axis. Reproductive: The prostate and seminal vesicles are grossly unremarkable. Other: None Musculoskeletal: Degenerative changes of the spine. Multilevel disc desiccation and vacuum phenomena. No acute osseous pathology. IMPRESSION: 1. Findings most consistent with esophagitis. An esophageal neoplasm is favored less likely but not excluded. Clinical correlation and follow-up recommended. 2. Cholelithiasis. 3. Sigmoid diverticulosis. No bowel obstruction or active inflammation. Normal appendix. Aortic Atherosclerosis (ICD10-I70.0). Electronically Signed   By: Anner Crete M.D.   On: 06/04/2019 19:50   Dg Chest Portable 1 View  Result Date: 06/04/2019 CLINICAL DATA:  83 year old male with fall. EXAM: PORTABLE CHEST 1 VIEW COMPARISON:  Chest CT dated 06/04/2019 FINDINGS: There is mild cardiomegaly with mild  vascular congestion. Small bilateral pleural effusions may be present. No focal consolidation or pneumothorax. Median sternotomy wires. No acute osseous pathology. IMPRESSION: Mild cardiomegaly with mild vascular congestion and small pleural effusion. Electronically Signed   By: Anner Crete M.D.   On: 06/04/2019 21:31    Procedures Procedures (including critical care time)  Medications Ordered in ED Medications  sodium chloride flush (NS) 0.9 % injection 3 mL (3 mLs Intravenous Not Given 06/04/19 1834)  sodium chloride 0.9 % bolus 500 mL (0 mLs Intravenous Stopped 06/04/19 2020)  iohexol (OMNIPAQUE) 300 MG/ML solution 75 mL (75 mLs Intravenous Contrast Given 06/04/19 1914)  ondansetron (ZOFRAN) injection 2 mg (2 mg Intravenous Given 06/04/19 1846)  morphine 2 MG/ML injection 2 mg (2 mg Intravenous Given 06/04/19 1846)  morphine 2 MG/ML injection 2 mg (2 mg Intravenous Given 06/04/19 2027)  ondansetron (ZOFRAN) 4 MG/5ML solution 2 mg (2 mg Oral Given 06/04/19 2027)  famotidine (PEPCID) IVPB 20 mg premix (0 mg Intravenous Stopped 06/04/19 2218)     Initial Impression / Assessment and Plan / ED Course  I have reviewed the triage vital signs and the nursing notes.  Pertinent labs & imaging results that were available during my care of the patient were reviewed by me and considered in my medical decision making (see chart for details).    Lactic 1.1 CK 67 Lipase within normal limits CBC nonacute CMP with creatinine of 1.68, slightly above baseline, will give fluid bolus CBG 192 Urinalysis needs to be collected  EKG:  Sinus rhythm Left bundle branch block Confirmed by Milton Ferguson [phone]) on 06/04/2019 8:27:33 PM  CT Head/C-spine:  IMPRESSION:  Atrophy and chronic ischemic change. No acute intracranial  abnormality    Negative for cervical spine fracture. Cervical spondylosis.    Carotid artery disease.   CT [facility]:  IMPRESSION:  1. Findings most consistent with esophagitis. An  esophageal neoplasm  is favored less likely but not excluded. Clinical correlation and  follow-up recommended.  2. Cholelithiasis.  3. Sigmoid diverticulosis. No bowel obstruction or active  inflammation. Normal appendix.    Aortic Atherosclerosis (ICD10-I70.0).   CXR:  IMPRESSION:  Mild cardiomegaly with mild vascular congestion and small pleural  effusion.   DG Left Elbow:  IMPRESSION:  1.  No definite acute fracture or dislocation. No significant joint  effusion.  2. Osteopenia.  - Patient seen and evaluated by Dr. Roderic Palau, advises Pepcid, GI referral regarding esophagitis.  Patient will need to follow-up with PCP regarding his recurrent falls.  No indication for further work-up or imaging at this time.  Patient may be discharged to home after he ambulates. - Patient ambulated well with nursing staff.  Awaiting urinalysis prior to discharge. - Urinalysis with 30 protein, no signs of infection - Patient reassessed sleeping easily arousable.  Resting comfortably and in no acute distress.  Discussed plan of care with his wife, states understanding.  Will call gastroenterologist tomorrow morning to schedule follow-up appointment for further evaluation as to etiology of esophagitis.  Patient will be prescribed Zofran along with Pepcid, encouraged to increase water intake over the next few days to avoid dehydration.  At this time there does not appear to be any evidence of an acute emergency medical condition and the patient appears stable for discharge with appropriate outpatient follow up. Diagnosis was discussed with patient who verbalizes understanding of care plan and is agreeable to discharge. I have discussed return precautions with patient and wife who verbalizes understanding of return precautions. Patient encouraged to follow-up with their PCP. All questions answered.  Note: Portions of this report may have been transcribed using voice recognition software. Every effort was made  to ensure accuracy; however, inadvertent computerized transcription errors may still be present. Final Clinical Impressions(s) / ED Diagnoses   Final diagnoses:  Esophagitis  Fall, initial encounter    ED Discharge Orders         Ordered    famotidine (PEPCID) 20 MG tablet  Daily     06/05/19 0002    ondansetron (ZOFRAN ODT) 4 MG disintegrating tablet  Every 8 hours PRN     06/05/19 0002           Gari Crown 06/05/19 0033    Milton Ferguson, MD 06/06/19 1052

## 2019-06-05 MED ORDER — FAMOTIDINE 20 MG PO TABS: 20.0000 mg | ORAL_TABLET | Freq: Every day | ORAL | 0 refills | Status: DC

## 2019-06-05 MED ORDER — ONDANSETRON 4 MG PO TBDP: 4.0000 mg | ORAL_TABLET | Freq: Three times a day (TID) | ORAL | 0 refills | Status: DC | PRN

## 2019-06-05 NOTE — Discharge Instructions (Addendum)
You have been diagnosed today with falls and esophagitis.  At this time there does not appear to be the presence of an emergent medical condition, however there is always the potential for conditions to change. Please read and follow the below instructions.  Please return to the Emergency Department immediately for any new or worsening symptoms or if your symptoms do not improve within 2 days. Please be sure to follow up with your Primary Care Provider within one week regarding your visit today; please call their office to schedule an appointment even if you are feeling better for a follow-up visit. Please continue taking the medication Pepcid as prescribed to help with esophagitis.  Please follow-up with the gastroenterologist Dr. Oneida Alar on your discharge paperwork for further evaluation of the cause of the inflammation of the esophagus. Please be sure to drink plenty of water to avoid dehydration. CT scan today also showed diverticulosis, cholelithiasis and atherosclerosis of the aorta.  Please discuss these findings with the primary care provider at the next visit. X-ray today showed cardiomegaly, mild vascular congestion and small pleural effusion, please discuss these findings with the primary care doctor at your next visit. Please call your primary care doctor's office tomorrow morning to schedule a follow-up visit for further evaluation regarding his recurrent falls.  Get help right away if: You have severe pain in your arms, neck, jaw, teeth, or back. You feel sweaty, dizzy, or light-headed. You have chest pain or shortness of breath. You vomit and your vomit looks like blood or coffee grounds. Your stool is bloody or black. You have a fever. You cannot swallow, drink, or eat. You have any new/concerning or worsening symptoms  Please read the additional information packets attached to your discharge summary.  Do not take your medicine if  develop an itchy rash, swelling in your mouth  or lips, or difficulty breathing; call 911 and seek immediate emergency medical attention if this occurs.

## 2019-06-06 ENCOUNTER — Encounter: Payer: Self-pay | Admitting: Internal Medicine

## 2019-06-06 DIAGNOSIS — R1110 Vomiting, unspecified: Secondary | ICD-10-CM | POA: Diagnosis not present

## 2019-06-06 DIAGNOSIS — R251 Tremor, unspecified: Secondary | ICD-10-CM | POA: Diagnosis not present

## 2019-06-06 DIAGNOSIS — M1991 Primary osteoarthritis, unspecified site: Secondary | ICD-10-CM | POA: Diagnosis not present

## 2019-06-10 ENCOUNTER — Inpatient Hospital Stay (HOSPITAL_COMMUNITY)
Admission: EM | Admit: 2019-06-10 | Discharge: 2019-06-17 | DRG: 064 | Disposition: A | Discharge status: Rehabilitation Facility | Payer: Medicare Other | Attending: Family Medicine | Admitting: Family Medicine

## 2019-06-10 ENCOUNTER — Emergency Department (HOSPITAL_COMMUNITY): Payer: Medicare Other

## 2019-06-10 DIAGNOSIS — I6389 Other cerebral infarction: Secondary | ICD-10-CM | POA: Diagnosis not present

## 2019-06-10 DIAGNOSIS — Z951 Presence of aortocoronary bypass graft: Secondary | ICD-10-CM

## 2019-06-10 DIAGNOSIS — N183 Chronic kidney disease, stage 3 unspecified: Secondary | ICD-10-CM | POA: Diagnosis present

## 2019-06-10 DIAGNOSIS — R4781 Slurred speech: Secondary | ICD-10-CM | POA: Diagnosis not present

## 2019-06-10 DIAGNOSIS — Z20828 Contact with and (suspected) exposure to other viral communicable diseases: Secondary | ICD-10-CM | POA: Diagnosis not present

## 2019-06-10 DIAGNOSIS — G9341 Metabolic encephalopathy: Secondary | ICD-10-CM | POA: Diagnosis not present

## 2019-06-10 DIAGNOSIS — E1122 Type 2 diabetes mellitus with diabetic chronic kidney disease: Secondary | ICD-10-CM | POA: Diagnosis present

## 2019-06-10 DIAGNOSIS — Z9841 Cataract extraction status, right eye: Secondary | ICD-10-CM

## 2019-06-10 DIAGNOSIS — I6523 Occlusion and stenosis of bilateral carotid arteries: Secondary | ICD-10-CM | POA: Diagnosis not present

## 2019-06-10 DIAGNOSIS — Z03818 Encounter for observation for suspected exposure to other biological agents ruled out: Secondary | ICD-10-CM | POA: Diagnosis not present

## 2019-06-10 DIAGNOSIS — I10 Essential (primary) hypertension: Secondary | ICD-10-CM

## 2019-06-10 DIAGNOSIS — Z7982 Long term (current) use of aspirin: Secondary | ICD-10-CM

## 2019-06-10 DIAGNOSIS — R402242 Coma scale, best verbal response, confused conversation, at arrival to emergency department: Secondary | ICD-10-CM | POA: Diagnosis present

## 2019-06-10 DIAGNOSIS — E1151 Type 2 diabetes mellitus with diabetic peripheral angiopathy without gangrene: Secondary | ICD-10-CM | POA: Diagnosis not present

## 2019-06-10 DIAGNOSIS — K2970 Gastritis, unspecified, without bleeding: Secondary | ICD-10-CM | POA: Diagnosis present

## 2019-06-10 DIAGNOSIS — Z96652 Presence of left artificial knee joint: Secondary | ICD-10-CM | POA: Diagnosis not present

## 2019-06-10 DIAGNOSIS — F419 Anxiety disorder, unspecified: Secondary | ICD-10-CM | POA: Diagnosis not present

## 2019-06-10 DIAGNOSIS — R402142 Coma scale, eyes open, spontaneous, at arrival to emergency department: Secondary | ICD-10-CM | POA: Diagnosis not present

## 2019-06-10 DIAGNOSIS — I129 Hypertensive chronic kidney disease with stage 1 through stage 4 chronic kidney disease, or unspecified chronic kidney disease: Secondary | ICD-10-CM | POA: Diagnosis present

## 2019-06-10 DIAGNOSIS — R296 Repeated falls: Secondary | ICD-10-CM | POA: Diagnosis present

## 2019-06-10 DIAGNOSIS — D631 Anemia in chronic kidney disease: Secondary | ICD-10-CM | POA: Diagnosis present

## 2019-06-10 DIAGNOSIS — I69322 Dysarthria following cerebral infarction: Secondary | ICD-10-CM

## 2019-06-10 DIAGNOSIS — I447 Left bundle-branch block, unspecified: Secondary | ICD-10-CM | POA: Diagnosis not present

## 2019-06-10 DIAGNOSIS — I639 Cerebral infarction, unspecified: Secondary | ICD-10-CM | POA: Diagnosis present

## 2019-06-10 DIAGNOSIS — I4891 Unspecified atrial fibrillation: Secondary | ICD-10-CM | POA: Diagnosis not present

## 2019-06-10 DIAGNOSIS — M1990 Unspecified osteoarthritis, unspecified site: Secondary | ICD-10-CM | POA: Diagnosis present

## 2019-06-10 DIAGNOSIS — I2510 Atherosclerotic heart disease of native coronary artery without angina pectoris: Secondary | ICD-10-CM | POA: Diagnosis present

## 2019-06-10 DIAGNOSIS — R404 Transient alteration of awareness: Secondary | ICD-10-CM | POA: Diagnosis not present

## 2019-06-10 DIAGNOSIS — I25810 Atherosclerosis of coronary artery bypass graft(s) without angina pectoris: Secondary | ICD-10-CM

## 2019-06-10 DIAGNOSIS — R479 Unspecified speech disturbances: Secondary | ICD-10-CM | POA: Diagnosis present

## 2019-06-10 DIAGNOSIS — R471 Dysarthria and anarthria: Secondary | ICD-10-CM | POA: Diagnosis not present

## 2019-06-10 DIAGNOSIS — Z961 Presence of intraocular lens: Secondary | ICD-10-CM | POA: Diagnosis present

## 2019-06-10 DIAGNOSIS — E1136 Type 2 diabetes mellitus with diabetic cataract: Secondary | ICD-10-CM | POA: Diagnosis not present

## 2019-06-10 DIAGNOSIS — I69354 Hemiplegia and hemiparesis following cerebral infarction affecting left non-dominant side: Secondary | ICD-10-CM

## 2019-06-10 DIAGNOSIS — R29703 NIHSS score 3: Secondary | ICD-10-CM | POA: Diagnosis not present

## 2019-06-10 DIAGNOSIS — Z7984 Long term (current) use of oral hypoglycemic drugs: Secondary | ICD-10-CM

## 2019-06-10 DIAGNOSIS — I6381 Other cerebral infarction due to occlusion or stenosis of small artery: Principal | ICD-10-CM | POA: Diagnosis present

## 2019-06-10 DIAGNOSIS — E785 Hyperlipidemia, unspecified: Secondary | ICD-10-CM | POA: Diagnosis present

## 2019-06-10 DIAGNOSIS — Z886 Allergy status to analgesic agent status: Secondary | ICD-10-CM

## 2019-06-10 DIAGNOSIS — H9193 Unspecified hearing loss, bilateral: Secondary | ICD-10-CM

## 2019-06-10 DIAGNOSIS — I6503 Occlusion and stenosis of bilateral vertebral arteries: Secondary | ICD-10-CM | POA: Diagnosis not present

## 2019-06-10 DIAGNOSIS — Z79899 Other long term (current) drug therapy: Secondary | ICD-10-CM

## 2019-06-10 DIAGNOSIS — M109 Gout, unspecified: Secondary | ICD-10-CM | POA: Diagnosis present

## 2019-06-10 DIAGNOSIS — J45909 Unspecified asthma, uncomplicated: Secondary | ICD-10-CM | POA: Diagnosis not present

## 2019-06-10 DIAGNOSIS — I6930 Unspecified sequelae of cerebral infarction: Secondary | ICD-10-CM

## 2019-06-10 DIAGNOSIS — I69320 Aphasia following cerebral infarction: Secondary | ICD-10-CM

## 2019-06-10 DIAGNOSIS — E119 Type 2 diabetes mellitus without complications: Secondary | ICD-10-CM

## 2019-06-10 DIAGNOSIS — E1165 Type 2 diabetes mellitus with hyperglycemia: Secondary | ICD-10-CM | POA: Diagnosis not present

## 2019-06-10 LAB — URINALYSIS, ROUTINE W REFLEX MICROSCOPIC
Bilirubin Urine: NEGATIVE
Glucose, UA: NEGATIVE mg/dL
Hgb urine dipstick: NEGATIVE
Ketones, ur: NEGATIVE mg/dL
Leukocytes,Ua: NEGATIVE
Nitrite: NEGATIVE
Protein, ur: NEGATIVE mg/dL
Specific Gravity, Urine: 1.015 (ref 1.005–1.030)
pH: 5 (ref 5.0–8.0)

## 2019-06-10 LAB — CBC WITH DIFFERENTIAL/PLATELET
Abs Immature Granulocytes: 0.04 10*3/uL (ref 0.00–0.07)
Basophils Absolute: 0.1 10*3/uL (ref 0.0–0.1)
Basophils Relative: 1 %
Eosinophils Absolute: 0.3 10*3/uL (ref 0.0–0.5)
Eosinophils Relative: 3 %
HCT: 32.9 % — ABNORMAL LOW (ref 39.0–52.0)
Hemoglobin: 10.2 g/dL — ABNORMAL LOW (ref 13.0–17.0)
Immature Granulocytes: 1 %
Lymphocytes Relative: 15 %
Lymphs Abs: 1.2 10*3/uL (ref 0.7–4.0)
MCH: 28.7 pg (ref 26.0–34.0)
MCHC: 31 g/dL (ref 30.0–36.0)
MCV: 92.4 fL (ref 80.0–100.0)
Monocytes Absolute: 0.6 10*3/uL (ref 0.1–1.0)
Monocytes Relative: 8 %
Neutro Abs: 5.9 10*3/uL (ref 1.7–7.7)
Neutrophils Relative %: 72 %
Platelets: 319 10*3/uL (ref 150–400)
RBC: 3.56 MIL/uL — ABNORMAL LOW (ref 4.22–5.81)
RDW: 14.9 % (ref 11.5–15.5)
WBC: 8.2 10*3/uL (ref 4.0–10.5)
nRBC: 0 % (ref 0.0–0.2)

## 2019-06-10 LAB — PROTIME-INR
INR: 1.1 (ref 0.8–1.2)
Prothrombin Time: 13.8 seconds (ref 11.4–15.2)

## 2019-06-10 LAB — CBG MONITORING, ED: Glucose-Capillary: 209 mg/dL — ABNORMAL HIGH (ref 70–99)

## 2019-06-10 LAB — RAPID URINE DRUG SCREEN, HOSP PERFORMED
Amphetamines: NOT DETECTED
Barbiturates: NOT DETECTED
Benzodiazepines: POSITIVE — AB
Cocaine: NOT DETECTED
Opiates: NOT DETECTED
Tetrahydrocannabinol: NOT DETECTED

## 2019-06-10 LAB — BASIC METABOLIC PANEL
Anion gap: 10 (ref 5–15)
BUN: 36 mg/dL — ABNORMAL HIGH (ref 8–23)
CO2: 22 mmol/L (ref 22–32)
Calcium: 8.9 mg/dL (ref 8.9–10.3)
Chloride: 100 mmol/L (ref 98–111)
Creatinine, Ser: 1.98 mg/dL — ABNORMAL HIGH (ref 0.61–1.24)
GFR calc Af Amer: 36 mL/min — ABNORMAL LOW (ref >60)
GFR calc non Af Amer: 31 mL/min — ABNORMAL LOW (ref >60)
Glucose, Bld: 215 mg/dL — ABNORMAL HIGH (ref 70–99)
Potassium: 3.7 mmol/L (ref 3.5–5.1)
Sodium: 132 mmol/L — ABNORMAL LOW (ref 135–145)

## 2019-06-10 LAB — ETHANOL: Alcohol, Ethyl (B): <10 mg/dL (ref <10)

## 2019-06-10 LAB — LACTIC ACID, PLASMA
Lactic Acid, Venous: 1.5 mmol/L (ref 0.5–1.9)
Lactic Acid, Venous: 0.9 mmol/L (ref 0.5–1.9)

## 2019-06-10 LAB — GLUCOSE, CAPILLARY: Glucose-Capillary: 81 mg/dL (ref 70–99)

## 2019-06-10 LAB — SARS CORONAVIRUS 2 BY RT PCR (HOSPITAL ORDER, PERFORMED IN ~~LOC~~ HOSPITAL LAB): SARS Coronavirus 2: NEGATIVE

## 2019-06-10 MED ORDER — ASPIRIN 325 MG PO TABS
325.0000 mg | ORAL_TABLET | Freq: Once | ORAL | Status: AC
  Administered 2019-06-10: 22:00:00 325 mg via ORAL
  Filled 2019-06-10: qty 1

## 2019-06-10 MED ORDER — SENNOSIDES-DOCUSATE SODIUM 8.6-50 MG PO TABS: 1.0000 | ORAL_TABLET | Freq: Every evening | ORAL | Status: DC | PRN

## 2019-06-10 MED ORDER — SODIUM CHLORIDE 0.9 % IV SOLN
INTRAVENOUS | Status: AC
  Administered 2019-06-11: 01:00:00 via INTRAVENOUS

## 2019-06-10 MED ORDER — LORATADINE 10 MG PO TABS
10.0000 mg | ORAL_TABLET | Freq: Every day | ORAL | Status: DC
  Administered 2019-06-10 – 2019-06-17 (×8): 10 mg via ORAL
  Filled 2019-06-10 (×8): qty 1

## 2019-06-10 MED ORDER — ALLOPURINOL 300 MG PO TABS
300.0000 mg | ORAL_TABLET | Freq: Every day | ORAL | Status: DC
  Administered 2019-06-10 – 2019-06-16 (×7): 300 mg via ORAL
  Filled 2019-06-10 (×3): qty 1
  Filled 2019-06-10: qty 3
  Filled 2019-06-10: qty 1
  Filled 2019-06-10 (×3): qty 3
  Filled 2019-06-10: qty 1
  Filled 2019-06-10: qty 3
  Filled 2019-06-10 (×3): qty 1
  Filled 2019-06-10 (×2): qty 3

## 2019-06-10 MED ORDER — GLIPIZIDE 5 MG PO TABS
5.0000 mg | ORAL_TABLET | Freq: Every morning | ORAL | Status: DC
  Administered 2019-06-11 – 2019-06-17 (×7): 5 mg via ORAL
  Filled 2019-06-10 (×7): qty 1

## 2019-06-10 MED ORDER — FAMOTIDINE 20 MG PO TABS
20.0000 mg | ORAL_TABLET | Freq: Every day | ORAL | Status: DC
  Administered 2019-06-11 – 2019-06-17 (×7): 20 mg via ORAL
  Filled 2019-06-10 (×2): qty 1
  Filled 2019-06-10: qty 2
  Filled 2019-06-10 (×4): qty 1

## 2019-06-10 MED ORDER — STROKE: EARLY STAGES OF RECOVERY BOOK
Freq: Once | Status: AC
  Administered 2019-06-10: 22:00:00
  Filled 2019-06-10: qty 1

## 2019-06-10 MED ORDER — ENOXAPARIN SODIUM 40 MG/0.4ML ~~LOC~~ SOLN
40.0000 mg | SUBCUTANEOUS | Status: DC
  Administered 2019-06-10 – 2019-06-16 (×7): 40 mg via SUBCUTANEOUS
  Filled 2019-06-10 (×7): qty 0.4

## 2019-06-10 MED ORDER — ACETAMINOPHEN 650 MG RE SUPP: 650.0000 mg | RECTAL | Status: DC | PRN

## 2019-06-10 MED ORDER — ACETAMINOPHEN 325 MG PO TABS: 650.0000 mg | ORAL_TABLET | ORAL | Status: DC | PRN

## 2019-06-10 MED ORDER — ACETAMINOPHEN 160 MG/5ML PO SOLN: 650.0000 mg | ORAL | Status: DC | PRN

## 2019-06-10 MED ORDER — IOHEXOL 350 MG/ML SOLN
100.0000 mL | Freq: Once | INTRAVENOUS | Status: AC | PRN
  Administered 2019-06-10: 14:00:00 100 mL via INTRAVENOUS

## 2019-06-10 MED ORDER — INSULIN ASPART 100 UNIT/ML ~~LOC~~ SOLN: 0.0000 [IU] | Freq: Every day | SUBCUTANEOUS | Status: DC

## 2019-06-10 MED ORDER — ATORVASTATIN CALCIUM 10 MG PO TABS
20.0000 mg | ORAL_TABLET | Freq: Every day | ORAL | Status: DC
  Administered 2019-06-10 – 2019-06-16 (×7): 20 mg via ORAL
  Filled 2019-06-10 (×7): qty 2

## 2019-06-10 MED ORDER — INSULIN ASPART 100 UNIT/ML ~~LOC~~ SOLN
0.0000 [IU] | Freq: Three times a day (TID) | SUBCUTANEOUS | Status: DC
  Administered 2019-06-14: 17:00:00 1 [IU] via SUBCUTANEOUS
  Administered 2019-06-14: 2 [IU] via SUBCUTANEOUS
  Administered 2019-06-15: 18:00:00 1 [IU] via SUBCUTANEOUS
  Administered 2019-06-15 – 2019-06-16 (×2): 2 [IU] via SUBCUTANEOUS
  Administered 2019-06-16 – 2019-06-17 (×4): 1 [IU] via SUBCUTANEOUS

## 2019-06-10 NOTE — ED Triage Notes (Signed)
Pt brought in by [facility] as a code stroke. Pt had sudden onset of confusion at 1220. Wife reported to EMS that pt is usually alert and oriented x 4 with no difficulties speaking. EMS reports pt does not follow commands and was babbling when they first arrived. EMS reports upon arrival to ED pt is still not following commands but his speech has become more normal when he does speak. CBG 300 for EMS.

## 2019-06-10 NOTE — Consult Note (Addendum)
[facility] Consult Services   Date of Service:   06/10/2019 14:03:37  Impression:     .  Rule Out Acute Ischemic Stroke  Comments/Sign-Out: acute onset difficulty speaking and understanding speech, occurring this am and superimposed over several days of lethargy and gait instability - concerning for L frontotemporal stroke, though encephalopathy could also potentially present like this. Given several days of gait instability, he is not a tpa candidate. CTA head/neck pending.  Mechanism of Stroke: Possible Thromboembolic Possible Cardioembolic Small Vessel Disease  Metrics: Last Known Well: Unknown [facility] Notification Time: 06/10/2019 14:03:37 Arrival Time: 06/10/2019 13:49:00 Stamp Time: 06/10/2019 14:03:37 Time First Login Attempt: 06/10/2019 14:09:50 Video Start Time: 06/10/2019 14:09:50  Symptoms: difficulty speaking NIHSS Start Assessment Time: 06/10/2019 14:24:40 Patient is not a candidate for Alteplase/Activase. Patient was not deemed candidate for Alteplase/Activase thrombolytics because of Last Well Known Above 4.5 Hours. Video End Time: 06/10/2019 14:38:11  CT head showed no acute hemorrhage or acute core infarct. CT head was reviewed.  Lower Likelihood of Large Vessel Occlusion but Following Stat Studies are Recommended  CTA Head and Neck. CT Perfusion.   ED Physician notified of diagnostic impression and management plan on 06/10/2019 14:38:12  Our recommendations are outlined below.  Recommendations:     .  Activate Stroke Protocol Admission/Order Set     .  Stroke/Telemetry Floor     .  Neuro Checks     .  Bedside Swallow Eval     .  DVT Prophylaxis     .  IV Fluids, Normal Saline     .  Head of Bed 30 Degrees     .  Euglycemia and Avoid Hyperthermia (PRN Acetaminophen)     .  start ASA if CT head is neg for hemorrhage.  Routine Consultation with [facility] for Follow up Care  Sign Out:     .   Discussed with Emergency Department Provider  Addendum: CTA head/neck shows R vert occlusion; CTP normal. Discussed with Dr. Leonel Ramsay, who states the patient is not an endovascular candidate, based on NIHSS 3 and CTP without perfusion deficit.   ------------------------------------------------------------------------------  History of Present Illness: Patient is a 83 year old Male.  Patient was brought by EMS for symptoms of difficulty speaking  83 yo man with acute onset garbled speech and questionable aphasia. She noted that he was not responding as he normally would at approx 1100. She subsequently noted the garbled speech. No LOC/convulsion. He takes ASA but no other blood thinners. He was recently admitted on 06/04/2019 after a fall. He has had difficulty walking since that time, which she described as wide based and stooped. 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  Last seen normal was within 4.5 hours. There is no history of hemorrhagic complications or intracranial hemorrhage. There is no history of Recent Anticoagulants. There is no history of recent major surgery. There is no history of recent stroke.  Past Medical History:     . Hypertension     . Diabetes Mellitus     . Hyperlipidemia     . Coronary Artery Disease     . There is NO history of Atrial Fibrillation     . There is NO history of Stroke  Anticoagulant use:  No  Antiplatelet use: ASA  Examination: BP(146/58), Pulse(67), Blood Glucose(209) 1A: Level of Consciousness - Alert; keenly responsive + 0 1B: Ask Month and Age - 1 Question Right + 1 1C: Blink Eyes & Squeeze  Hands - Performs Both Tasks + 0 2: Test Horizontal Extraocular Movements - Normal + 0 3: Test Visual Fields - No Visual Loss + 0 4: Test Facial Palsy (Use Grimace if Obtunded) - Normal symmetry + 0 5A: Test Left Arm Motor Drift - No Drift for 10 Seconds + 0 5B: Test Right Arm Motor Drift - No Drift for 10 Seconds + 0 6A: Test Left Leg  Motor Drift - Drift, but doesn't hit bed + 1 6B: Test Right Leg Motor Drift - Drift, but doesn't hit bed + 1 7: Test Limb Ataxia (FNF/Heel-Shin) - No Ataxia + 0 8: Test Sensation - Normal; No sensory loss + 0 9: Test Language/Aphasia - Normal; No aphasia + 0 10: Test Dysarthria - Normal + 0 11: Test Extinction/Inattention - No abnormality + 0  NIHSS Score: 3  Patient/Family was informed the Neurology Consult would happen via TeleHealth consult by way of interactive audio and video telecommunications and consented to receiving care in this manner.   Due to the immediate potential for life-threatening deterioration due to underlying acute neurologic illness, I spent 25 minutes providing critical care. This time includes time for face to face visit via telemedicine, review of medical records, imaging studies and discussion of findings with providers, the patient and/or family.   Dr Hal Morales   [facility] [phone]  Case [id number]

## 2019-06-10 NOTE — ED Notes (Signed)
In and out catheter.  434ml clear, yellow urine.  Patient tolerated well.

## 2019-06-10 NOTE — H&P (Addendum)
History and Physical    [name] ASN:[id number] DOB: September 05, 1936 DOA: 06/10/2019  PCP: [name], MD   Patient coming from: Home  I have personally briefly reviewed patient's old medical records in [place]  Chief Complaint: Altered mental status  HPI: [name] is a 83 y.o. male with medical history significant for hypertension, diabetes mellitus, asthma, coronary artery disease, who was brought to the ED by EMS, reports of confusion and garbled speech.  Symptoms started at about 12.15-12.20 , but patient's spouse present at bedside tells me this was about 10:30 AM today.  History is obtained from the spouse, as patient still is not able to give me a history due to speech difficulties versus comprehension versus hearing problems.  Patient spouse reported over the past several days, patient has been very drowsy and sleepy.  Today patient woke up, per spouse he had some gait abnormality this morning, but he was able to stand on his own and groom himself.  He also had breakfast.  Patient tried to respond verbally but his speech was garbled, and not making sense.  This has never happened before. At baseline patient has hearing difficulty uses a hearing aid, currently has only 1 of a pair, as the 2nd hearing aid is currently defective.  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 She denies any fever or chills, no pain with urination, no difficulty breathing no cough, no vomiting no loose stools.  ED Course: blood pressure systolic 193X to 902I.  Blood alcohol level less than 10.  CTA head negative for acute abnormality, chronic small vessel ischemia with small chronic infarcts.  Subsequent CTA head and neck and cerebral perfusion study-occluded right vertebral artery, patent left vertebral artery with moderate to severe stenosis of its origin, 80%  distal right carotid artery stenosis and 60% proximal right ICA stenosis, 80% proximal left ICA stenosis, moderate right and mild left intracranial ICA stenosis.  Negative CT PE. Telemetry neurology neurology was consulted, recommended admission for stroke work-up.  Patient was not an endovascular candidate based on NIHSS 3 and CTP without perfusion deficit.   Review of Systems: As per HPI all other systems reviewed and negative.  Past Medical History:  Diagnosis Date  . Anxiety   . Arthritis   . Asthma   . Coronary artery disease   . Diabetes mellitus without complication ([place])   . Gout   . HOH (hard of hearing) bilateral  . Hypertension   . PONV (postoperative nausea and vomiting)     Past Surgical History:  Procedure Laterality Date  . CATARACT EXTRACTION W/PHACO  11/28/2011   Procedure: CATARACT EXTRACTION PHACO AND INTRAOCULAR LENS PLACEMENT (IOC);  Surgeon: Williams Che, MD;  Location: [facility];  Service: Ophthalmology;  Laterality: Right;  CDE 3.84  . CORONARY ARTERY BYPASS GRAFT  1997   6 vessels  . EYE SURGERY  1995   left eye cataract removal   . TOTAL KNEE  PROSTHESIS REMOVAL W/ SPACER INSERTION  8 yrs ago   right     reports that he has never smoked. He has never used smokeless tobacco. He reports that he does not drink alcohol or use drugs.  Allergies  Allergen Reactions  . Nsaids Nausea And Vomiting    Family History  Problem Relation Age of Onset  . Anesthesia problems Neg Hx   . Hypotension  Neg Hx   . Malignant hyperthermia Neg Hx   . Pseudochol deficiency Neg Hx     Prior to Admission medications   Medication Sig Start Date End Date Taking? Authorizing Provider  albuterol (VENTOLIN HFA) 108 (90 Base) MCG/ACT inhaler Inhale 1-2 puffs into the lungs every 6 (six) hours as needed for wheezing or shortness of breath.  03/05/19  Yes [provider]  allopurinol (ZYLOPRIM) 300 MG tablet Take 300 mg by mouth at bedtime.    Yes [provider]  ALPRAZolam (XANAX) 0.5 MG tablet Take 0.25-0.5 mg by mouth 3 (three) times daily as needed for anxiety.    Yes [provider]  amLODipine (NORVASC) 5 MG tablet Take 5 mg by mouth every morning.  05/03/19  Yes [provider]  aspirin EC 325 MG tablet Take 325 mg by mouth once.    Yes [provider]  atenolol (TENORMIN) 100 MG tablet Take 100 mg by mouth every morning.    Yes [provider]  atorvastatin (LIPITOR) 40 MG tablet Take 20 mg by mouth at bedtime.    Yes [provider]  cholecalciferol (VITAMIN D3) 25 MCG (1000 UT) tablet Take 1,000 Units by mouth every evening.   Yes [provider]  co-enzyme Q-10 30 MG capsule Take 30 mg by mouth at bedtime.   Yes [provider]  famotidine (PEPCID) 20 MG tablet Take 1 tablet (20 mg total) by mouth daily. 06/05/19  Yes Nuala Alpha A, PA-C  furosemide (LASIX) 20 MG tablet Take 20 mg by mouth every morning. *May take as needed for fluid retention 04/23/19  Yes [provider]  glipiZIDE (GLUCOTROL) 5 MG tablet Take 5 mg by mouth every morning.  05/03/19  Yes [provider]  lisinopril (ZESTRIL) 5 MG tablet Take 5 mg by mouth every morning.  05/03/19  Yes [provider]  loratadine (CLARITIN) 10 MG tablet Take 10 mg by mouth daily.   Yes [provider]  Lutein 20 MG CAPS Take 1 capsule by mouth every morning.    Yes [provider]  nystatin cream (MYCOSTATIN) Apply 1 application topically daily as needed for dry skin ((genital areas)).   Yes [provider]  ondansetron (ZOFRAN ODT) 4 MG disintegrating tablet Take 1 tablet (4 mg total) by mouth every 8 (eight) hours as needed for nausea or vomiting. 06/05/19  Yes Nuala Alpha A, PA-C  theophylline (THEODUR) 300 MG 12 hr tablet Take 300 mg by mouth 2 (two) times daily.   Yes [provider]  triamcinolone cream (KENALOG) 0.1 % Apply 1 application topically daily as  needed (for irritation (face)).    Yes [provider]  vitamin B-12 (CYANOCOBALAMIN) 250 MCG tablet Take 250 mcg by mouth every evening.   Yes [provider]  vitamin C (ASCORBIC ACID) 500 MG tablet Take 500 mg by mouth every evening.   Yes [provider]  VITAMIN E COMPLEX PO Take 1 capsule by mouth every evening.   Yes [provider]    Physical Exam: Vitals:   06/10/19 1745 06/10/19 1800 06/10/19 1900 06/10/19 1930  BP:   (!) 155/74 (!) 161/81  Pulse: (!) 57 (!) 58 (!) 58 (!) 58  Resp: 15 16 (!) 27 15  SpO2: 98% 97% 96% 97%  Weight:      Height:        Constitutional: NAD, calm, comfortable Vitals:   06/10/19 1745 06/10/19 1800 06/10/19 1900 06/10/19 1930  BP:   (!) 155/74 (!) 161/81  Pulse: (!) 57 (!) 58 (!) 58 (!) 58  Resp: 15 16 (!) 27 15  SpO2: 98% 97% 96% 97%  Weight:      Height:       Eyes: PERRL, lids and conjunctivae normal ENMT: Mucous membranes are moist. Posterior pharynx clear of any exudate or lesions.  Neck: normal, supple, no masses, no thyromegaly Respiratory: clear to auscultation bilaterally, no wheezing, no crackles. Normal respiratory effort. No accessory muscle use.  Cardiovascular: Regular rate and rhythm, no murmurs / rubs / gallops. No extremity edema. 2+ pedal pulses. Abdomen: no tenderness, no masses palpated. No hepatosplenomegaly. Bowel sounds positive.  Musculoskeletal: no clubbing / cyanosis.  Congenital left upper extremity deformity.   Skin: no rashes, lesions, ulcers. No induration Neurologic: Even with the help of his wife, exam is extremely limited by speech versus comprehension versus hearing difficulties or combination of all of this.  He is able to tell me his wife's name-Linda.  Nods his head or answers yes inappropriately, but not responding verbally to any other questions.  Raises his bilateral lower extremities symmetrically, and right upper extremity.  No obvious facial droop.  Rest of cranial  nerve examination limited.  Gait not tested. Psychiatric: Unable to assess.   Labs on Admission: I have personally reviewed following labs and imaging studies  CBC: Recent Labs  Lab 06/04/19 1625 06/10/19 1441  WBC 9.0 8.2  NEUTROABS  --  5.9  HGB 12.3* 10.2*  HCT 38.6* 32.9*  MCV 90.4 92.4  PLT 310 128   Basic Metabolic Panel: Recent Labs  Lab 06/04/19 1625 06/10/19 1441  NA 138 132*  K 3.7 3.7  CL 103 100  CO2 25 22  GLUCOSE 201* 215*  BUN 31* 36*  CREATININE 1.68* 1.98*  CALCIUM 10.2 8.9   Liver Function Tests: Recent Labs  Lab 06/04/19 1625  AST 18  ALT 13  ALKPHOS 55  BILITOT 0.6  PROT 8.1  ALBUMIN 4.2   Recent Labs  Lab 06/04/19 1625  LIPASE 20   Coagulation Profile: Recent Labs  Lab 06/10/19 1441  INR 1.1   Cardiac Enzymes: Recent Labs  Lab 06/04/19 1919  CKTOTAL 67   CBG: Recent Labs  Lab 06/04/19 1611 06/10/19 1435  GLUCAP 192* 209*   Urine analysis:    Component Value Date/Time   COLORURINE YELLOW 06/10/2019 [place] 06/10/2019 1353   LABSPEC 1.015 06/10/2019 1353   PHURINE 5.0 06/10/2019 1353   GLUCOSEU NEGATIVE 06/10/2019 1353   HGBUR NEGATIVE 06/10/2019 [place] 06/10/2019 1353   KETONESUR NEGATIVE 06/10/2019 1353   PROTEINUR NEGATIVE 06/10/2019 1353   NITRITE NEGATIVE 06/10/2019 1353   LEUKOCYTESUR NEGATIVE 06/10/2019 1353    Radiological Exams on Admission: Ct Angio Head W Or Wo Contrast  Result Date: 06/10/2019 CLINICAL DATA:  Acute altered mental status/confusion. Speech disturbance. EXAM: CT ANGIOGRAPHY HEAD AND NECK CT PERFUSION BRAIN TECHNIQUE: Multidetector CT imaging of the head and neck was performed using the standard protocol during bolus administration of intravenous contrast. Multiplanar CT image reconstructions and MIPs were obtained to evaluate the vascular anatomy. Carotid stenosis measurements (when applicable) are obtained utilizing NASCET criteria, using the distal  internal carotid diameter as the denominator. Multiphase CT imaging of the brain was performed following IV bolus contrast injection. Subsequent parametric perfusion maps were calculated using RAPID software. CONTRAST:  165mL OMNIPAQUE IOHEXOL 350 MG/ML SOLN COMPARISON:  None. FINDINGS: CTA NECK FINDINGS Aortic  arch: Standard 3 vessel aortic arch with moderate atherosclerotic plaque. Calcified plaque at both subclavian artery origins does not result in significant stenosis. Right carotid system: Patent with predominantly calcified plaque about the carotid bifurcation resulting in approximately 80% distal common carotid artery stenosis an approximately 60% proximal ICA stenosis. Retropharyngeal course of the proximal ICA. Moderate ECA origin stenosis. Left carotid system: Patent with predominantly calcified plaque about the carotid bifurcation resulting in 80% proximal ICA stenosis. Partially retropharyngeal course of the proximal ICA. Vertebral arteries: The left vertebral artery is patent with a moderate to severe origin stenosis due to calcified plaque. The right vertebral artery is occluded at its origin with reconstitution of only a small portion of the V2 segment. Skeleton: No fracture or focal osseous lesion. Moderate to severe multilevel cervical disc and facet degeneration. Interbody and left-sided facet ankylosis at C4-5. Other neck: No evidence of cervical lymphadenopathy or mass. Upper chest: Mild apical lung scarring. Trace right pleural effusion. Review of the MIP images confirms the above findings CTA HEAD FINDINGS Anterior circulation: The internal carotid arteries are patent from skull base to carotid termini with relatively diffuse calcified plaque resulting in moderate right and mild left proximal supraclinoid stenoses. ACAs and MCAs are patent without evidence of proximal branch occlusion or significant proximal stenosis. Posterior circulation: The intracranial left vertebral artery is widely  patent and supplies the basilar. Opacification of a small right V4 segment is likely retrograde. Patent left PICA, right AICA, and bilateral SCA origins are identified. The basilar artery is widely patent. There is a fetal origin of the left PCA. Moderate proximal right P2, severe distal left P2, and severe left greater than right P3 stenoses are present. No aneurysm is identified. Venous sinuses: Patent. Anatomic variants: Fetal left PCA. Review of the MIP images confirms the above findings CT Brain Perfusion Findings: ASPECTS: 10 CBF (<30%) Volume: 82mL Perfusion (Tmax>6.0s) volume: 1mL Mismatch Volume: 10mL Infarction Location: None evident by CTP IMPRESSION: 1. Occluded right vertebral artery. 2. Patent left vertebral artery with moderate to severe stenosis of its origin. 3. 80% distal right common carotid artery stenosis and 60% proximal right ICA stenosis. 4. 80% proximal left ICA stenosis. 5. Moderate right and mild left intracranial ICA stenoses. 6. Negative CTP. 7.  Aortic Atherosclerosis (ICD10-I70.0). These results were called by telephone at the time of interpretation on 06/10/2019 at 2:47 pm to Dr. Veryl Speak , who verbally acknowledged these results. Electronically Signed   By: Logan Bores M.D.   On: 06/10/2019 15:18   Ct Head Wo Contrast  Result Date: 06/10/2019 CLINICAL DATA:  Acute altered mental status/confusion. Improving speech abnormality. EXAM: CT HEAD WITHOUT CONTRAST TECHNIQUE: Contiguous axial images were obtained from the base of the skull through the vertex without intravenous contrast. COMPARISON:  06/04/2019 FINDINGS: Brain: There is no evidence of acute infarct, intracranial hemorrhage, mass, midline shift, or extra-axial fluid collection. 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 Small chronic cortical/subcortical infarcts are also again seen in the right parietal and left occipital lobes. Patchy hypodensities elsewhere in the  cerebral white matter bilaterally are unchanged and nonspecific but compatible with mild-to-moderate chronic small vessel ischemic disease. There is mild cerebral atrophy. Vascular: Calcified atherosclerosis at the skull base. No hyperdense vessel. Skull: No fracture or focal osseous lesion. Sinuses/Orbits: Mild right sphenoid sinus mucosal thickening. Clear mastoid air cells. Bilateral cataract extraction. Other: None. IMPRESSION: 1. No evidence of acute intracranial abnormality.  ASPECTS of 10. 2. Chronic  small vessel ischemia with small chronic infarcts as above. These results were called by telephone at the time of interpretation on 06/10/2019 at 2:16 pm to Dr. Veryl Speak , who verbally acknowledged these results. Electronically Signed   By: Logan Bores M.D.   On: 06/10/2019 14:18   Ct Angio Neck W And/or Wo Contrast  Result Date: 06/10/2019 CLINICAL DATA:  Acute altered mental status/confusion. Speech disturbance. EXAM: CT ANGIOGRAPHY HEAD AND NECK CT PERFUSION BRAIN TECHNIQUE: Multidetector CT imaging of the head and neck was performed using the standard protocol during bolus administration of intravenous contrast. Multiplanar CT image reconstructions and MIPs were obtained to evaluate the vascular anatomy. Carotid stenosis measurements (when applicable) are obtained utilizing NASCET criteria, using the distal internal carotid diameter as the denominator. Multiphase CT imaging of the brain was performed following IV bolus contrast injection. Subsequent parametric perfusion maps were calculated using RAPID software. CONTRAST:  136mL OMNIPAQUE IOHEXOL 350 MG/ML SOLN COMPARISON:  None. FINDINGS: CTA NECK FINDINGS Aortic arch: Standard 3 vessel aortic arch with moderate atherosclerotic plaque. Calcified plaque at both subclavian artery origins does not result in significant stenosis. Right carotid system: Patent with predominantly calcified plaque about the carotid bifurcation resulting in approximately 80%  distal common carotid artery stenosis an approximately 60% proximal ICA stenosis. Retropharyngeal course of the proximal ICA. Moderate ECA origin stenosis. Left carotid system: Patent with predominantly calcified plaque about the carotid bifurcation resulting in 80% proximal ICA stenosis. Partially retropharyngeal course of the proximal ICA. Vertebral arteries: The left vertebral artery is patent with a moderate to severe origin stenosis due to calcified plaque. The right vertebral artery is occluded at its origin with reconstitution of only a small portion of the V2 segment. Skeleton: No fracture or focal osseous lesion. Moderate to severe multilevel cervical disc and facet degeneration. Interbody and left-sided facet ankylosis at C4-5. Other neck: No evidence of cervical lymphadenopathy or mass. Upper chest: Mild apical lung scarring. Trace right pleural effusion. Review of the MIP images confirms the above findings CTA HEAD FINDINGS Anterior circulation: The internal carotid arteries are patent from skull base to carotid termini with relatively diffuse calcified plaque resulting in moderate right and mild left proximal supraclinoid stenoses. ACAs and MCAs are patent without evidence of proximal branch occlusion or significant proximal stenosis. Posterior circulation: The intracranial left vertebral artery is widely patent and supplies the basilar. Opacification of a small right V4 segment is likely retrograde. Patent left PICA, right AICA, and bilateral SCA origins are identified. The basilar artery is widely patent. There is a fetal origin of the left PCA. Moderate proximal right P2, severe distal left P2, and severe left greater than right P3 stenoses are present. No aneurysm is identified. Venous sinuses: Patent. Anatomic variants: Fetal left PCA. Review of the MIP images confirms the above findings CT Brain Perfusion Findings: ASPECTS: 10 CBF (<30%) Volume: 22mL Perfusion (Tmax>6.0s) volume: 34mL Mismatch  Volume: 66mL Infarction Location: None evident by CTP IMPRESSION: 1. Occluded right vertebral artery. 2. Patent left vertebral artery with moderate to severe stenosis of its origin. 3. 80% distal right common carotid artery stenosis and 60% proximal right ICA stenosis. 4. 80% proximal left ICA stenosis. 5. Moderate right and mild left intracranial ICA stenoses. 6. Negative CTP. 7.  Aortic Atherosclerosis (ICD10-I70.0). These results were called by telephone at the time of interpretation on 06/10/2019 at 2:47 pm to Dr. Veryl Speak , who verbally acknowledged these results. Electronically Signed   By: Seymour Bars.D.  On: 06/10/2019 15:18   Ct Cerebral Perfusion W Contrast  Result Date: 06/10/2019 CLINICAL DATA:  Acute altered mental status/confusion. Speech disturbance. EXAM: CT ANGIOGRAPHY HEAD AND NECK CT PERFUSION BRAIN TECHNIQUE: Multidetector CT imaging of the head and neck was performed using the standard protocol during bolus administration of intravenous contrast. Multiplanar CT image reconstructions and MIPs were obtained to evaluate the vascular anatomy. Carotid stenosis measurements (when applicable) are obtained utilizing NASCET criteria, using the distal internal carotid diameter as the denominator. Multiphase CT imaging of the brain was performed following IV bolus contrast injection. Subsequent parametric perfusion maps were calculated using RAPID software. CONTRAST:  154mL OMNIPAQUE IOHEXOL 350 MG/ML SOLN COMPARISON:  None. FINDINGS: CTA NECK FINDINGS Aortic arch: Standard 3 vessel aortic arch with moderate atherosclerotic plaque. Calcified plaque at both subclavian artery origins does not result in significant stenosis. Right carotid system: Patent with predominantly calcified plaque about the carotid bifurcation resulting in approximately 80% distal common carotid artery stenosis an approximately 60% proximal ICA stenosis. Retropharyngeal course of the proximal ICA. Moderate ECA origin  stenosis. Left carotid system: Patent with predominantly calcified plaque about the carotid bifurcation resulting in 80% proximal ICA stenosis. Partially retropharyngeal course of the proximal ICA. Vertebral arteries: The left vertebral artery is patent with a moderate to severe origin stenosis due to calcified plaque. The right vertebral artery is occluded at its origin with reconstitution of only a small portion of the V2 segment. Skeleton: No fracture or focal osseous lesion. Moderate to severe multilevel cervical disc and facet degeneration. Interbody and left-sided facet ankylosis at C4-5. Other neck: No evidence of cervical lymphadenopathy or mass. Upper chest: Mild apical lung scarring. Trace right pleural effusion. Review of the MIP images confirms the above findings CTA HEAD FINDINGS Anterior circulation: The internal carotid arteries are patent from skull base to carotid termini with relatively diffuse calcified plaque resulting in moderate right and mild left proximal supraclinoid stenoses. ACAs and MCAs are patent without evidence of proximal branch occlusion or significant proximal stenosis. Posterior circulation: The intracranial left vertebral artery is widely patent and supplies the basilar. Opacification of a small right V4 segment is likely retrograde. Patent left PICA, right AICA, and bilateral SCA origins are identified. The basilar artery is widely patent. There is a fetal origin of the left PCA. Moderate proximal right P2, severe distal left P2, and severe left greater than right P3 stenoses are present. No aneurysm is identified. Venous sinuses: Patent. Anatomic variants: Fetal left PCA. Review of the MIP images confirms the above findings CT Brain Perfusion Findings: ASPECTS: 10 CBF (<30%) Volume: 55mL Perfusion (Tmax>6.0s) volume: 39mL Mismatch Volume: 46mL Infarction Location: None evident by CTP IMPRESSION: 1. Occluded right vertebral artery. 2. Patent left vertebral artery with moderate to  severe stenosis of its origin. 3. 80% distal right common carotid artery stenosis and 60% proximal right ICA stenosis. 4. 80% proximal left ICA stenosis. 5. Moderate right and mild left intracranial ICA stenoses. 6. Negative CTP. 7.  Aortic Atherosclerosis (ICD10-I70.0). These results were called by telephone at the time of interpretation on 06/10/2019 at 2:47 pm to Dr. Veryl Speak , who verbally acknowledged these results. Electronically Signed   By: Logan Bores M.D.   On: 06/10/2019 15:18    EKG: Independently reviewed.  Rate 67, AV dissociation with old left bundle branch block.    Assessment/Plan Active Problems:   Speech abnormality   Speech abnormality-persistent.  Reported initial garbled speech.  Not verbalizing much at this time.  Probable  CVA.  Reports of drowsiness and sleepiness of several days.  Gait abnormality today.  Stat head CT negative for acute abnormality.  Port CTA neck shows occlusion involving the origin of left vertebral artery, right common carotid artery, proximal right ICA and left ICA. Patient is on 325mg  aspirin daily.  -Passed bedside swallow evaluation -Talked to telemetry neurologist about imaging findings, recommends patients can be transferred to [facility] now or subsequently for vascular surgery evaluation. -Echocardiogram - CTA done, carotid Dopplers deferred -Lipid panel, hemoglobin A1c a.m. -Resume home statins -PT, speech therapy evaluation - Aspirin 325 x 1, defer subsequent antiplatelets to neurology - I talked to Neurology at [facility], Dr. Rory Percy, patient will be seen in a.m. - Repeat EKG.  - MRI brain  Metabolic encephalopathy-reported increased sleepiness/drowsiness for several days.  On my evaluation patient is awake and alert.  Patient was in the ED 9/22 with similar complaints, and a fall. -Hold home PRN Xanax.  Hypertension-systolic 887N to 797K. -Hold home lisinopril, atenolol, Lasix, Norvasc 5 mg, allow for permissive hypertension in the setting of  probable CVA.  Diabetes mellitus-random glucose 215. -Resume home glipizide - SSI  Asthma- Stable. -PRN albuterol  Coronary artery disease- Stable.  Three-vessel bypass 1997.  -Aspirin, statins. -Repeat EKG  CKD 3-creatinine 1.9, recent baseline per care everywhere 1.8-2.   -Will hydrate and hold lisinopril for contrast exposure.  - N/s 100cc/hr x 15 hrs.   DVT prophylaxis: Lovenox Code Status: Full Family Communication: SPouse at bedside Disposition Plan: Per rounding team Consults called: Neurology Admission status: Obs, tele   Bethena Roys MD [facility]  06/10/2019, 8:47 PM

## 2019-06-10 NOTE — ED Notes (Signed)
Patient transported to CT at this time. 

## 2019-06-10 NOTE — ED Notes (Signed)
[facility] called with room assignment at [facility].

## 2019-06-10 NOTE — ED Provider Notes (Signed)
[facility] EMERGENCY DEPARTMENT Provider Note   CSN: [id number] Arrival date & time: 06/10/19  1349  An emergency department physician performed an initial assessment on this suspected stroke patient at 1352.  History   Chief Complaint Chief Complaint  Patient presents with  . Code Stroke    HPI [name] is a 83 y.o. male.     Patient is an 83 year old male with past medical history of diabetes, coronary artery disease, hypertension.  Is brought by EMS for evaluation of mental status change.  From what I understand, the patient was at home today with his wife.  Approximately 1215 he developed the acute onset of confusion and dysarthria.  According to EMS, patient was not following commands initially upon their arrival.  This seemed to improve somewhat during transport.  Patient denies to me he is experiencing any discomfort.  History somewhat limited secondary to the acuity of illness.  The history is provided by the patient.    Past Medical History:  Diagnosis Date  . Anxiety   . Arthritis   . Asthma   . Coronary artery disease   . Diabetes mellitus without complication ([place])   . Gout   . HOH (hard of hearing) bilateral  . Hypertension   . PONV (postoperative nausea and vomiting)     There are no active problems to display for this patient.   Past Surgical History:  Procedure Laterality Date  . CATARACT EXTRACTION W/PHACO  11/28/2011   Procedure: CATARACT EXTRACTION PHACO AND INTRAOCULAR LENS PLACEMENT (IOC);  Surgeon: Williams Che, MD;  Location: [facility];  Service: Ophthalmology;  Laterality: Right;  CDE 3.84  . CORONARY ARTERY BYPASS GRAFT  1997   6 vessels  . EYE SURGERY  1995   left eye cataract removal   . TOTAL KNEE  PROSTHESIS REMOVAL W/ SPACER INSERTION  8 yrs ago   right        Home Medications    Prior to Admission medications   Medication Sig Start Date End Date Taking? Authorizing Provider  albuterol (VENTOLIN HFA) 108 (90 Base)  MCG/ACT inhaler  03/05/19   [provider]  albuterol-ipratropium (COMBIVENT) 18-103 MCG/ACT inhaler Inhale 2 puffs into the lungs every 6 (six) hours as needed. For wheezing    [provider]  allopurinol (ZYLOPRIM) 300 MG tablet Take 300 mg by mouth daily.    [provider]  ALPRAZolam Duanne Moron) 0.5 MG tablet Take 0.5 mg by mouth 3 (three) times daily as needed. For anxiety    [provider]  amLODipine (NORVASC) 5 MG tablet  05/03/19   [provider]  aspirin EC 325 MG tablet Take 325 mg by mouth daily.    [provider]  atenolol (TENORMIN) 100 MG tablet Take 100 mg by mouth daily.    [provider]  atorvastatin (LIPITOR) 40 MG tablet Take 40 mg by mouth at bedtime.    [provider]  famotidine (PEPCID) 20 MG tablet Take 1 tablet (20 mg total) by mouth daily. 06/05/19   Nuala Alpha A, PA-C  furosemide (LASIX) 20 MG tablet  04/23/19   [provider]  glipiZIDE (GLUCOTROL) 5 MG tablet  05/03/19   [provider]  hydrochlorothiazide (HYDRODIURIL) 25 MG tablet Take 12.5 mg by mouth daily.    [provider]  lisinopril (ZESTRIL) 5 MG tablet  05/03/19   [provider]  Lutein 20 MG CAPS Take 1 capsule by mouth daily.    [provider]  naphazoline (CLEAR EYES) 0.012 % ophthalmic solution Place 1 drop into both eyes daily.    [provider]  naproxen sodium (ANAPROX) 220 MG tablet Take 220 mg by mouth every 8 (eight) hours as needed. For leg pain    [provider]  Omega-3 Fatty Acids (FISH OIL) 600 MG CAPS Take 1 capsule by mouth daily.    [provider]  ondansetron (ZOFRAN ODT) 4 MG disintegrating tablet Take 1 tablet (4 mg total) by mouth every 8 (eight) hours as needed for nausea or vomiting. 06/05/19   Nuala Alpha A, PA-C  Red Yeast Rice Extract (RED YEAST RICE PO) Take 1 capsule by mouth daily.    [provider]   theophylline (THEODUR) 300 MG 12 hr tablet Take 300 mg by mouth 2 (two) times daily.    [provider]  triamcinolone cream (KENALOG) 0.1 % Apply 1 application topically as needed. For face breakout    [provider]    Family History Family History  Problem Relation Age of Onset  . Anesthesia problems Neg Hx   . Hypotension Neg Hx   . Malignant hyperthermia Neg Hx   . Pseudochol deficiency Neg Hx     Social History Social History   Tobacco Use  . Smoking status: Never Smoker  . Smokeless tobacco: Never Used  Substance Use Topics  . Alcohol use: No  . Drug use: No     Allergies   Patient has no known allergies.   Review of Systems Review of Systems  Unable to perform ROS: Acuity of condition     Physical Exam Updated Vital Signs BP (!) 146/58 (BP Location: Left Arm)   Pulse 67   Resp 16   Ht 5\' 10"  (1.778 m)   Wt 80 kg   SpO2 97%   BMI 25.31 kg/m   Physical Exam Vitals signs and nursing note reviewed.  Constitutional:      General: He is not in acute distress.    Appearance: He is well-developed. He is not diaphoretic.  HENT:     Head: Normocephalic and atraumatic.  Neck:     Musculoskeletal: Normal range of motion and neck supple.  Cardiovascular:     Rate and Rhythm: Normal rate and regular rhythm.     Heart sounds: No murmur. No friction rub.  Pulmonary:     Effort: Pulmonary effort is normal. No respiratory distress.     Breath sounds: Normal breath sounds. No wheezing or rales.  Abdominal:     General: Bowel sounds are normal. There is no distension.     Palpations: Abdomen is soft.     Tenderness: There is no abdominal tenderness.  Musculoskeletal: Normal range of motion.  Skin:    General: Skin is warm and dry.  Neurological:     Mental Status: He is alert.     Coordination: Coordination normal.     Comments: Patient is awake and alert, but appears somewhat confused.  He is slow to process and carry out commands, but  does so appropriately.  Cranial nerves II through XII appear grossly intact.  Strength is 4+ out of 5 and symmetrical with the exception of the left upper extremity of which he has minimal use due to some sort of congenital abnormality.      ED Treatments / Results  Labs (all labs ordered are listed, but only abnormal results are displayed) Labs Reviewed  CBG MONITORING, ED - Abnormal; Notable for the  following components:      Result Value   Glucose-Capillary 209 (*)    All other components within normal limits  BASIC METABOLIC PANEL  CBC WITH DIFFERENTIAL/PLATELET  PROTIME-INR  LACTIC ACID, PLASMA  LACTIC ACID, PLASMA  ETHANOL  URINALYSIS, ROUTINE W REFLEX MICROSCOPIC  RAPID URINE DRUG SCREEN, HOSP PERFORMED    EKG EKG Interpretation  Date/Time:  Monday June 10 2019 14:40:07 EDT Ventricular Rate:  67 PR Interval:    QRS Duration: 143 QT Interval:  413 QTC Calculation: 436 R Axis:   37 Text Interpretation:  AV dissociation Left bundle branch block No significant change since 06/04/2019 Confirmed by Veryl Speak [phone]) on 06/10/2019 3:53:54 PM   Radiology Ct Head Wo Contrast  Result Date: 06/10/2019 CLINICAL DATA:  Acute altered mental status/confusion. Improving speech abnormality. EXAM: CT HEAD WITHOUT CONTRAST TECHNIQUE: Contiguous axial images were obtained from the base of the skull through the vertex without intravenous contrast. COMPARISON:  06/04/2019 FINDINGS: Brain: There is no evidence of acute infarct, intracranial hemorrhage, mass, midline shift, or extra-axial fluid collection. 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 Small chronic cortical/subcortical infarcts are also again seen in the right parietal and left occipital lobes. Patchy hypodensities elsewhere in the cerebral white matter bilaterally are unchanged and nonspecific but compatible with mild-to-moderate chronic small vessel ischemic disease. There  is mild cerebral atrophy. Vascular: Calcified atherosclerosis at the skull base. No hyperdense vessel. Skull: No fracture or focal osseous lesion. Sinuses/Orbits: Mild right sphenoid sinus mucosal thickening. Clear mastoid air cells. Bilateral cataract extraction. Other: None. IMPRESSION: 1. No evidence of acute intracranial abnormality.  ASPECTS of 10. 2. Chronic small vessel ischemia with small chronic infarcts as above. These results were called by telephone at the time of interpretation on 06/10/2019 at 2:16 pm to Dr. Veryl Speak , who verbally acknowledged these results. Electronically Signed   By: Logan Bores M.D.   On: 06/10/2019 14:18    Procedures Procedures (including critical care time)  Medications Ordered in ED Medications  iohexol (OMNIPAQUE) 350 MG/ML injection 100 mL (100 mLs Intravenous Contrast Given 06/10/19 1412)     Initial Impression / Assessment and Plan / ED Course  I have reviewed the triage vital signs and the nursing notes.  Pertinent labs & imaging results that were available during my care of the patient were reviewed by me and considered in my medical decision making (see chart for details).  Patient arrived to the ER as a code stroke.  He was seen immediately upon arrival.  He then went to radiology for a stat CT scan of his head.  This was obtained and reveals no evidence for acute stroke.  Patient was evaluated by neurology who does not feel as though the patient is a candidate for thrombolysis or intervention.  Additional history between the neurologist and the wife reveals the patient has been somewhat unsteady on his feet for the past 2 days, making his last known well time out of the range of intervention.  He is recommending admission to the hospitalist service for evaluation of his symptoms.  I have spoken with Dr. Denton Brick who agrees to admit.  Final Clinical Impressions(s) / ED Diagnoses   Final diagnoses:  None    ED Discharge Orders    None        Veryl Speak, MD 06/10/19 1554

## 2019-06-10 NOTE — ED Notes (Signed)
Code Stroke paged out 

## 2019-06-10 NOTE — ED Notes (Signed)
ED TO INPATIENT HANDOFF REPORT  ED Nurse Name and Phone #: [phone]  S Name/Age/Gender [name] 83 y.o. male Room/Bed: APOTF/OTF  Code Status   Code Status: Not on file  Home/SNF/Other Home Patient oriented to: self Is this baseline? No   Triage Complete: Triage complete  Chief Complaint Code Stroke  Triage Note Pt brought in by [facility] as a code stroke. Pt had sudden onset of confusion at 1220. Wife reported to EMS that pt is usually alert and oriented x 4 with no difficulties speaking. EMS reports pt does not follow commands and was babbling when they first arrived. EMS reports upon arrival to ED pt is still not following commands but his speech has become more normal when he does speak. CBG 300 for EMS.   Allergies Allergies  Allergen Reactions  . Nsaids Nausea And Vomiting    Level of Care/Admitting Diagnosis ED Disposition    ED Disposition Condition Comment   Admit  Hospital Area: [place] [[id number]]  Level of Care: Telemetry Medical [104]  I expect the patient will be discharged within 24 hours: No (not a candidate for 5C-Observation unit)  Covid Evaluation: Asymptomatic Screening Protocol (No Symptoms)  Diagnosis: Speech abnormality [989211]  Admitting Physician: Bethena Roys [phone]  Attending Physician: Bethena Roys Nessa.Cuff  PT Class (Do Not Modify): Observation [104]  PT Acc Code (Do Not Modify): Observation [10022]       B Medical/Surgery History Past Medical History:  Diagnosis Date  . Anxiety   . Arthritis   . Asthma   . Coronary artery disease   . Diabetes mellitus without complication ([place])   . Gout   . HOH (hard of hearing) bilateral  . Hypertension   . PONV (postoperative nausea and vomiting)    Past Surgical History:  Procedure Laterality Date  . CATARACT EXTRACTION W/PHACO  11/28/2011   Procedure: CATARACT EXTRACTION PHACO AND INTRAOCULAR LENS PLACEMENT (IOC);  Surgeon: Williams Che,  MD;  Location: [facility];  Service: Ophthalmology;  Laterality: Right;  CDE 3.84  . CORONARY ARTERY BYPASS GRAFT  1997   6 vessels  . EYE SURGERY  1995   left eye cataract removal   . TOTAL KNEE  PROSTHESIS REMOVAL W/ SPACER INSERTION  8 yrs ago   right     A IV Location/Drains/Wounds Patient Lines/Drains/Airways Status   Active Line/Drains/Airways    Name:   Placement date:   Placement time:   Site:   Days:   Peripheral IV 06/10/19 Right Antecubital   06/10/19    1354    Antecubital   less than 1   Peripheral IV 06/10/19 Right Forearm   06/10/19    1402    Forearm   less than 1          Intake/Output Last 24 hours No intake or output data in the 24 hours ending 06/10/19 2013  Labs/Imaging Results for orders placed or performed during the hospital encounter of 06/10/19 (from the past 48 hour(s))  Urinalysis, Routine w reflex microscopic     Status: None   Collection Time: 06/10/19  1:53 PM  Result Value Ref Range   Color, Urine YELLOW YELLOW   APPearance CLEAR CLEAR   Specific Gravity, Urine 1.015 1.005 - 1.030   pH 5.0 5.0 - 8.0   Glucose, UA NEGATIVE NEGATIVE mg/dL   Hgb urine dipstick NEGATIVE NEGATIVE   Bilirubin Urine NEGATIVE NEGATIVE   Ketones, ur NEGATIVE NEGATIVE mg/dL   Protein,  ur NEGATIVE NEGATIVE mg/dL   Nitrite NEGATIVE NEGATIVE   Leukocytes,Ua NEGATIVE NEGATIVE    Comment: Performed at [facility], [address] [place], [place] [address]  Urine rapid drug screen (hosp performed)     Status: Abnormal   Collection Time: 06/10/19  1:53 PM  Result Value Ref Range   Opiates NONE DETECTED NONE DETECTED   Cocaine NONE DETECTED NONE DETECTED   Benzodiazepines POSITIVE (A) NONE DETECTED   Amphetamines NONE DETECTED NONE DETECTED   Tetrahydrocannabinol NONE DETECTED NONE DETECTED   Barbiturates NONE DETECTED NONE DETECTED    Comment: (NOTE) DRUG SCREEN FOR MEDICAL PURPOSES ONLY.  IF CONFIRMATION IS NEEDED FOR ANY PURPOSE, NOTIFY LAB WITHIN 5 DAYS. LOWEST  DETECTABLE LIMITS FOR URINE DRUG SCREEN Drug Class                     Cutoff (ng/mL) Amphetamine and metabolites    1000 Barbiturate and metabolites    200 Benzodiazepine                 353 Tricyclics and metabolites     300 Opiates and metabolites        300 Cocaine and metabolites        300 THC                            50 Performed at [facility], [address] [place], [place] [address]   POC CBG, ED     Status: Abnormal   Collection Time: 06/10/19  2:35 PM  Result Value Ref Range   Glucose-Capillary 209 (H) 70 - 99 mg/dL  Basic metabolic panel     Status: Abnormal   Collection Time: 06/10/19  2:41 PM  Result Value Ref Range   Sodium 132 (L) 135 - 145 mmol/L   Potassium 3.7 3.5 - 5.1 mmol/L   Chloride 100 98 - 111 mmol/L   CO2 22 22 - 32 mmol/L   Glucose, Bld 215 (H) 70 - 99 mg/dL   BUN 36 (H) 8 - 23 mg/dL   Creatinine, Ser 1.98 (H) 0.61 - 1.24 mg/dL   Calcium 8.9 8.9 - 10.3 mg/dL   GFR calc non Af Amer 31 (L) >60 mL/min   GFR calc Af Amer 36 (L) >60 mL/min   Anion gap 10 5 - 15    Comment: Performed at [facility], [address], [place], [place] [address]  CBC with Differential     Status: Abnormal   Collection Time: 06/10/19  2:41 PM  Result Value Ref Range   WBC 8.2 4.0 - 10.5 K/uL   RBC 3.56 (L) 4.22 - 5.81 MIL/uL   Hemoglobin 10.2 (L) 13.0 - 17.0 g/dL   HCT 32.9 (L) 39.0 - 52.0 %   MCV 92.4 80.0 - 100.0 fL   MCH 28.7 26.0 - 34.0 pg   MCHC 31.0 30.0 - 36.0 g/dL   RDW 14.9 11.5 - 15.5 %   Platelets 319 150 - 400 K/uL   nRBC 0.0 0.0 - 0.2 %   Neutrophils Relative % 72 %   Neutro Abs 5.9 1.7 - 7.7 K/uL   Lymphocytes Relative 15 %   Lymphs Abs 1.2 0.7 - 4.0 K/uL   Monocytes Relative 8 %   Monocytes Absolute 0.6 0.1 - 1.0 K/uL   Eosinophils Relative 3 %   Eosinophils Absolute 0.3 0.0 - 0.5 K/uL   Basophils Relative 1 %  Basophils Absolute 0.1 0.0 - 0.1 K/uL   Immature Granulocytes 1 %   Abs Immature Granulocytes 0.04 0.00 - 0.07 K/uL    Comment:  Performed at [facility], [address], [place], [place] [address]  Protime-INR     Status: None   Collection Time: 06/10/19  2:41 PM  Result Value Ref Range   Prothrombin Time 13.8 11.4 - 15.2 seconds   INR 1.1 0.8 - 1.2    Comment: (NOTE) INR goal varies based on device and disease states. Performed at [facility], [address] [place], [place] [address]   Lactic acid, plasma     Status: None   Collection Time: 06/10/19  2:41 PM  Result Value Ref Range   Lactic Acid, Venous 1.5 0.5 - 1.9 mmol/L    Comment: Performed at [facility], [address], [place], [place] [address]  Ethanol     Status: None   Collection Time: 06/10/19  2:41 PM  Result Value Ref Range   Alcohol, Ethyl (B) <10 <10 mg/dL    Comment: (NOTE) Lowest detectable limit for serum alcohol is 10 mg/dL. For medical purposes only. Performed at [facility], [address] [place], [place] [address]   Lactic acid, plasma     Status: None   Collection Time: 06/10/19  4:32 PM  Result Value Ref Range   Lactic Acid, Venous 0.9 0.5 - 1.9 mmol/L    Comment: Performed at [facility], [address] [place], [place] [address]  SARS Coronavirus 2 [facility] order, Performed in [facility] hospital lab) Nasopharyngeal Nasopharyngeal Swab     Status: None   Collection Time: 06/10/19  6:29 PM   Specimen: Nasopharyngeal Swab  Result Value Ref Range   SARS Coronavirus 2 NEGATIVE NEGATIVE    Comment: (NOTE) If result is NEGATIVE SARS-CoV-2 target nucleic acids are NOT DETECTED. The SARS-CoV-2 RNA is generally detectable in upper and lower  respiratory specimens during the acute phase of infection. The lowest  concentration of SARS-CoV-2 viral copies this assay can detect is 250  copies / mL. A negative result does not preclude SARS-CoV-2 infection  and should not be used as the sole basis for treatment or other  patient management decisions.  A negative result may occur with  improper specimen collection / handling,  submission of specimen other  than nasopharyngeal swab, presence of viral mutation(s) within the  areas targeted by this assay, and inadequate number of viral copies  (<250 copies / mL). A negative result must be combined with clinical  observations, patient history, and epidemiological information. If result is POSITIVE SARS-CoV-2 target nucleic acids are DETECTED. The SARS-CoV-2 RNA is generally detectable in upper and lower  respiratory specimens dur ing the acute phase of infection.  Positive  results are indicative of active infection with SARS-CoV-2.  Clinical  correlation with patient history and other diagnostic information is  necessary to determine patient infection status.  Positive results do  not rule out bacterial infection or co-infection with other viruses. If result is PRESUMPTIVE POSTIVE SARS-CoV-2 nucleic acids MAY BE PRESENT.   A presumptive positive result was obtained on the submitted specimen  and confirmed on repeat testing.  While 2019 novel coronavirus  (SARS-CoV-2) nucleic acids may be present in the submitted sample  additional confirmatory testing may be necessary for epidemiological  and / or clinical management purposes  to differentiate between  SARS-CoV-2 and other Sarbecovirus currently known to infect humans.  If clinically indicated additional testing with an  alternate test  methodology [id number]) is advised. The SARS-CoV-2 RNA is generally  detectable in upper and lower respiratory sp ecimens during the acute  phase of infection. The expected result is Negative. Fact Sheet for Patients:  StrictlyIdeas.no Fact Sheet for Healthcare Providers: BankingDealers.co.za This test is not yet approved or cleared by the [place] FDA and has been authorized for detection and/or diagnosis of SARS-CoV-2 by FDA under an Emergency Use Authorization (EUA).  This EUA will remain in effect (meaning this test can be  used) for the duration of the COVID-19 declaration under Section 564(b)(1) of the Act, 21 U.S.C. section 360bbb-3(b)(1), unless the authorization is terminated or revoked sooner. Performed at [facility], [address] [place], [place] [address]    Ct Angio Head W Or Wo Contrast  Result Date: 06/10/2019 CLINICAL DATA:  Acute altered mental status/confusion. Speech disturbance. EXAM: CT ANGIOGRAPHY HEAD AND NECK CT PERFUSION BRAIN TECHNIQUE: Multidetector CT imaging of the head and neck was performed using the standard protocol during bolus administration of intravenous contrast. Multiplanar CT image reconstructions and MIPs were obtained to evaluate the vascular anatomy. Carotid stenosis measurements (when applicable) are obtained utilizing NASCET criteria, using the distal internal carotid diameter as the denominator. Multiphase CT imaging of the brain was performed following IV bolus contrast injection. Subsequent parametric perfusion maps were calculated using RAPID software. CONTRAST:  145mL OMNIPAQUE IOHEXOL 350 MG/ML SOLN COMPARISON:  None. FINDINGS: CTA NECK FINDINGS Aortic arch: Standard 3 vessel aortic arch with moderate atherosclerotic plaque. Calcified plaque at both subclavian artery origins does not result in significant stenosis. Right carotid system: Patent with predominantly calcified plaque about the carotid bifurcation resulting in approximately 80% distal common carotid artery stenosis an approximately 60% proximal ICA stenosis. Retropharyngeal course of the proximal ICA. Moderate ECA origin stenosis. Left carotid system: Patent with predominantly calcified plaque about the carotid bifurcation resulting in 80% proximal ICA stenosis. Partially retropharyngeal course of the proximal ICA. Vertebral arteries: The left vertebral artery is patent with a moderate to severe origin stenosis due to calcified plaque. The right vertebral artery is occluded at its origin with reconstitution of only  a small portion of the V2 segment. Skeleton: No fracture or focal osseous lesion. Moderate to severe multilevel cervical disc and facet degeneration. Interbody and left-sided facet ankylosis at C4-5. Other neck: No evidence of cervical lymphadenopathy or mass. Upper chest: Mild apical lung scarring. Trace right pleural effusion. Review of the MIP images confirms the above findings CTA HEAD FINDINGS Anterior circulation: The internal carotid arteries are patent from skull base to carotid termini with relatively diffuse calcified plaque resulting in moderate right and mild left proximal supraclinoid stenoses. ACAs and MCAs are patent without evidence of proximal branch occlusion or significant proximal stenosis. Posterior circulation: The intracranial left vertebral artery is widely patent and supplies the basilar. Opacification of a small right V4 segment is likely retrograde. Patent left PICA, right AICA, and bilateral SCA origins are identified. The basilar artery is widely patent. There is a fetal origin of the left PCA. Moderate proximal right P2, severe distal left P2, and severe left greater than right P3 stenoses are present. No aneurysm is identified. Venous sinuses: Patent. Anatomic variants: Fetal left PCA. Review of the MIP images confirms the above findings CT Brain Perfusion Findings: ASPECTS: 10 CBF (<30%) Volume: 74mL Perfusion (Tmax>6.0s) volume: 79mL Mismatch Volume: 84mL Infarction Location: None evident by CTP IMPRESSION: 1. Occluded right vertebral artery. 2. Patent left vertebral artery with moderate to severe stenosis  of its origin. 3. 80% distal right common carotid artery stenosis and 60% proximal right ICA stenosis. 4. 80% proximal left ICA stenosis. 5. Moderate right and mild left intracranial ICA stenoses. 6. Negative CTP. 7.  Aortic Atherosclerosis (ICD10-I70.0). These results were called by telephone at the time of interpretation on 06/10/2019 at 2:47 pm to Dr. Veryl Speak , who verbally  acknowledged these results. Electronically Signed   By: Logan Bores M.D.   On: 06/10/2019 15:18   Ct Head Wo Contrast  Result Date: 06/10/2019 CLINICAL DATA:  Acute altered mental status/confusion. Improving speech abnormality. EXAM: CT HEAD WITHOUT CONTRAST TECHNIQUE: Contiguous axial images were obtained from the base of the skull through the vertex without intravenous contrast. COMPARISON:  06/04/2019 FINDINGS: Brain: There is no evidence of acute infarct, intracranial hemorrhage, mass, midline shift, or extra-axial fluid collection. 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 Small chronic cortical/subcortical infarcts are also again seen in the right parietal and left occipital lobes. Patchy hypodensities elsewhere in the cerebral white matter bilaterally are unchanged and nonspecific but compatible with mild-to-moderate chronic small vessel ischemic disease. There is mild cerebral atrophy. Vascular: Calcified atherosclerosis at the skull base. No hyperdense vessel. Skull: No fracture or focal osseous lesion. Sinuses/Orbits: Mild right sphenoid sinus mucosal thickening. Clear mastoid air cells. Bilateral cataract extraction. Other: None. IMPRESSION: 1. No evidence of acute intracranial abnormality.  ASPECTS of 10. 2. Chronic small vessel ischemia with small chronic infarcts as above. These results were called by telephone at the time of interpretation on 06/10/2019 at 2:16 pm to Dr. Veryl Speak , who verbally acknowledged these results. Electronically Signed   By: Logan Bores M.D.   On: 06/10/2019 14:18   Ct Angio Neck W And/or Wo Contrast  Result Date: 06/10/2019 CLINICAL DATA:  Acute altered mental status/confusion. Speech disturbance. EXAM: CT ANGIOGRAPHY HEAD AND NECK CT PERFUSION BRAIN TECHNIQUE: Multidetector CT imaging of the head and neck was performed using the standard protocol during bolus administration of intravenous contrast. Multiplanar  CT image reconstructions and MIPs were obtained to evaluate the vascular anatomy. Carotid stenosis measurements (when applicable) are obtained utilizing NASCET criteria, using the distal internal carotid diameter as the denominator. Multiphase CT imaging of the brain was performed following IV bolus contrast injection. Subsequent parametric perfusion maps were calculated using RAPID software. CONTRAST:  170mL OMNIPAQUE IOHEXOL 350 MG/ML SOLN COMPARISON:  None. FINDINGS: CTA NECK FINDINGS Aortic arch: Standard 3 vessel aortic arch with moderate atherosclerotic plaque. Calcified plaque at both subclavian artery origins does not result in significant stenosis. Right carotid system: Patent with predominantly calcified plaque about the carotid bifurcation resulting in approximately 80% distal common carotid artery stenosis an approximately 60% proximal ICA stenosis. Retropharyngeal course of the proximal ICA. Moderate ECA origin stenosis. Left carotid system: Patent with predominantly calcified plaque about the carotid bifurcation resulting in 80% proximal ICA stenosis. Partially retropharyngeal course of the proximal ICA. Vertebral arteries: The left vertebral artery is patent with a moderate to severe origin stenosis due to calcified plaque. The right vertebral artery is occluded at its origin with reconstitution of only a small portion of the V2 segment. Skeleton: No fracture or focal osseous lesion. Moderate to severe multilevel cervical disc and facet degeneration. Interbody and left-sided facet ankylosis at C4-5. Other neck: No evidence of cervical lymphadenopathy or mass. Upper chest: Mild apical lung scarring. Trace right pleural effusion. Review of the MIP images confirms the above findings CTA HEAD FINDINGS Anterior  circulation: The internal carotid arteries are patent from skull base to carotid termini with relatively diffuse calcified plaque resulting in moderate right and mild left proximal supraclinoid  stenoses. ACAs and MCAs are patent without evidence of proximal branch occlusion or significant proximal stenosis. Posterior circulation: The intracranial left vertebral artery is widely patent and supplies the basilar. Opacification of a small right V4 segment is likely retrograde. Patent left PICA, right AICA, and bilateral SCA origins are identified. The basilar artery is widely patent. There is a fetal origin of the left PCA. Moderate proximal right P2, severe distal left P2, and severe left greater than right P3 stenoses are present. No aneurysm is identified. Venous sinuses: Patent. Anatomic variants: Fetal left PCA. Review of the MIP images confirms the above findings CT Brain Perfusion Findings: ASPECTS: 10 CBF (<30%) Volume: 82mL Perfusion (Tmax>6.0s) volume: 74mL Mismatch Volume: 58mL Infarction Location: None evident by CTP IMPRESSION: 1. Occluded right vertebral artery. 2. Patent left vertebral artery with moderate to severe stenosis of its origin. 3. 80% distal right common carotid artery stenosis and 60% proximal right ICA stenosis. 4. 80% proximal left ICA stenosis. 5. Moderate right and mild left intracranial ICA stenoses. 6. Negative CTP. 7.  Aortic Atherosclerosis (ICD10-I70.0). These results were called by telephone at the time of interpretation on 06/10/2019 at 2:47 pm to Dr. Veryl Speak , who verbally acknowledged these results. Electronically Signed   By: Logan Bores M.D.   On: 06/10/2019 15:18   Ct Cerebral Perfusion W Contrast  Result Date: 06/10/2019 CLINICAL DATA:  Acute altered mental status/confusion. Speech disturbance. EXAM: CT ANGIOGRAPHY HEAD AND NECK CT PERFUSION BRAIN TECHNIQUE: Multidetector CT imaging of the head and neck was performed using the standard protocol during bolus administration of intravenous contrast. Multiplanar CT image reconstructions and MIPs were obtained to evaluate the vascular anatomy. Carotid stenosis measurements (when applicable) are obtained utilizing  NASCET criteria, using the distal internal carotid diameter as the denominator. Multiphase CT imaging of the brain was performed following IV bolus contrast injection. Subsequent parametric perfusion maps were calculated using RAPID software. CONTRAST:  114mL OMNIPAQUE IOHEXOL 350 MG/ML SOLN COMPARISON:  None. FINDINGS: CTA NECK FINDINGS Aortic arch: Standard 3 vessel aortic arch with moderate atherosclerotic plaque. Calcified plaque at both subclavian artery origins does not result in significant stenosis. Right carotid system: Patent with predominantly calcified plaque about the carotid bifurcation resulting in approximately 80% distal common carotid artery stenosis an approximately 60% proximal ICA stenosis. Retropharyngeal course of the proximal ICA. Moderate ECA origin stenosis. Left carotid system: Patent with predominantly calcified plaque about the carotid bifurcation resulting in 80% proximal ICA stenosis. Partially retropharyngeal course of the proximal ICA. Vertebral arteries: The left vertebral artery is patent with a moderate to severe origin stenosis due to calcified plaque. The right vertebral artery is occluded at its origin with reconstitution of only a small portion of the V2 segment. Skeleton: No fracture or focal osseous lesion. Moderate to severe multilevel cervical disc and facet degeneration. Interbody and left-sided facet ankylosis at C4-5. Other neck: No evidence of cervical lymphadenopathy or mass. Upper chest: Mild apical lung scarring. Trace right pleural effusion. Review of the MIP images confirms the above findings CTA HEAD FINDINGS Anterior circulation: The internal carotid arteries are patent from skull base to carotid termini with relatively diffuse calcified plaque resulting in moderate right and mild left proximal supraclinoid stenoses. ACAs and MCAs are patent without evidence of proximal branch occlusion or significant proximal stenosis. Posterior circulation: The intracranial  left vertebral artery is widely patent and supplies the basilar. Opacification of a small right V4 segment is likely retrograde. Patent left PICA, right AICA, and bilateral SCA origins are identified. The basilar artery is widely patent. There is a fetal origin of the left PCA. Moderate proximal right P2, severe distal left P2, and severe left greater than right P3 stenoses are present. No aneurysm is identified. Venous sinuses: Patent. Anatomic variants: Fetal left PCA. Review of the MIP images confirms the above findings CT Brain Perfusion Findings: ASPECTS: 10 CBF (<30%) Volume: 26mL Perfusion (Tmax>6.0s) volume: 54mL Mismatch Volume: 30mL Infarction Location: None evident by CTP IMPRESSION: 1. Occluded right vertebral artery. 2. Patent left vertebral artery with moderate to severe stenosis of its origin. 3. 80% distal right common carotid artery stenosis and 60% proximal right ICA stenosis. 4. 80% proximal left ICA stenosis. 5. Moderate right and mild left intracranial ICA stenoses. 6. Negative CTP. 7.  Aortic Atherosclerosis (ICD10-I70.0). These results were called by telephone at the time of interpretation on 06/10/2019 at 2:47 pm to Dr. Veryl Speak , who verbally acknowledged these results. Electronically Signed   By: Logan Bores M.D.   On: 06/10/2019 15:18    Pending Labs [employer] (From admission, onward)    Start     Ordered   Signed and Held  Hemoglobin A1c  Tomorrow morning,   R     Signed and Held   Signed and Held  Lipid panel  Tomorrow morning,   R    Comments: Fasting    Signed and Held          Vitals/Pain Today's Vitals   06/10/19 1900 06/10/19 1930 06/10/19 1958 06/10/19 2003  BP: (!) 155/74 (!) 161/81    Pulse: (!) 58 (!) 58    Resp: (!) 27 15    Temp:    98.2 F (36.8 C)  SpO2: 96% 97%    Weight:      Height:      PainSc:   0-No pain     Isolation Precautions No active isolations  Medications Medications  iohexol (OMNIPAQUE) 350 MG/ML injection 100 mL (100  mLs Intravenous Contrast Given 06/10/19 1412)    Mobility walks High fall risk   Focused Assessments Neuro Assessment Handoff:  Swallow screen pass? Yes    NIH Stroke Scale ( + Modified Stroke Scale Criteria)  Interval: Initial Level of Consciousness (1a.)   : Alert, keenly responsive LOC Questions (1b. )   +: Answers both questions correctly LOC Commands (1c. )   + : Performs both tasks correctly Best Gaze (2. )  +: Normal Visual (3. )  +: No visual loss Facial Palsy (4. )    : Normal symmetrical movements Motor Arm, Left (5a. )   +: No drift(birth defect) Motor Arm, Right (5b. )   +: No drift Motor Leg, Left (6a. )   +: No drift Motor Leg, Right (6b. )   +: Drift Limb Ataxia (7. ): Absent Sensory (8. )   +: Normal, no sensory loss Best Language (9. )   +: Mild-to-moderate aphasia Dysarthria (10. ): Normal Extinction/Inattention (11.)   +: Visual/tactile/auditory/spatial/personal inattention Modified SS Total  +: 3 Complete NIHSS TOTAL: 4 Last date known well: 06/10/19 Last time known well: 1215 Neuro Assessment:   Neuro Checks:   Initial (06/10/19 1500)  Last Documented NIHSS Modified Score: 3 (06/10/19 1805) Has TPA been given? No If patient is a Neuro Trauma and patient is going to  OR before floor call report to [place] nurse: [phone] or [phone]     R Recommendations: See Admitting Provider Note  Report given to:   Additional Notes: pt is confused.

## 2019-06-11 ENCOUNTER — Inpatient Hospital Stay (HOSPITAL_COMMUNITY): Payer: Medicare Other

## 2019-06-11 ENCOUNTER — Observation Stay (HOSPITAL_COMMUNITY): Payer: Medicare Other

## 2019-06-11 DIAGNOSIS — Z974 Presence of external hearing-aid: Secondary | ICD-10-CM | POA: Diagnosis not present

## 2019-06-11 DIAGNOSIS — I69354 Hemiplegia and hemiparesis following cerebral infarction affecting left non-dominant side: Secondary | ICD-10-CM | POA: Diagnosis not present

## 2019-06-11 DIAGNOSIS — E785 Hyperlipidemia, unspecified: Secondary | ICD-10-CM | POA: Diagnosis not present

## 2019-06-11 DIAGNOSIS — I6523 Occlusion and stenosis of bilateral carotid arteries: Secondary | ICD-10-CM | POA: Diagnosis not present

## 2019-06-11 DIAGNOSIS — N183 Chronic kidney disease, stage 3 unspecified: Secondary | ICD-10-CM | POA: Diagnosis not present

## 2019-06-11 DIAGNOSIS — E441 Mild protein-calorie malnutrition: Secondary | ICD-10-CM | POA: Diagnosis not present

## 2019-06-11 DIAGNOSIS — E1136 Type 2 diabetes mellitus with diabetic cataract: Secondary | ICD-10-CM | POA: Diagnosis present

## 2019-06-11 DIAGNOSIS — I6389 Other cerebral infarction: Secondary | ICD-10-CM

## 2019-06-11 DIAGNOSIS — H9193 Unspecified hearing loss, bilateral: Secondary | ICD-10-CM | POA: Diagnosis not present

## 2019-06-11 DIAGNOSIS — I639 Cerebral infarction, unspecified: Secondary | ICD-10-CM | POA: Diagnosis not present

## 2019-06-11 DIAGNOSIS — I6930 Unspecified sequelae of cerebral infarction: Secondary | ICD-10-CM | POA: Diagnosis not present

## 2019-06-11 DIAGNOSIS — E1151 Type 2 diabetes mellitus with diabetic peripheral angiopathy without gangrene: Secondary | ICD-10-CM | POA: Diagnosis present

## 2019-06-11 DIAGNOSIS — I10 Essential (primary) hypertension: Secondary | ICD-10-CM | POA: Diagnosis not present

## 2019-06-11 DIAGNOSIS — Z961 Presence of intraocular lens: Secondary | ICD-10-CM | POA: Diagnosis present

## 2019-06-11 DIAGNOSIS — M1990 Unspecified osteoarthritis, unspecified site: Secondary | ICD-10-CM | POA: Diagnosis present

## 2019-06-11 DIAGNOSIS — I6381 Other cerebral infarction due to occlusion or stenosis of small artery: Secondary | ICD-10-CM | POA: Diagnosis not present

## 2019-06-11 DIAGNOSIS — I63039 Cerebral infarction due to thrombosis of unspecified carotid artery: Secondary | ICD-10-CM | POA: Diagnosis not present

## 2019-06-11 DIAGNOSIS — R4781 Slurred speech: Secondary | ICD-10-CM | POA: Diagnosis not present

## 2019-06-11 DIAGNOSIS — Z20828 Contact with and (suspected) exposure to other viral communicable diseases: Secondary | ICD-10-CM | POA: Diagnosis present

## 2019-06-11 DIAGNOSIS — Z7984 Long term (current) use of oral hypoglycemic drugs: Secondary | ICD-10-CM | POA: Diagnosis not present

## 2019-06-11 DIAGNOSIS — E1165 Type 2 diabetes mellitus with hyperglycemia: Secondary | ICD-10-CM | POA: Diagnosis not present

## 2019-06-11 DIAGNOSIS — F419 Anxiety disorder, unspecified: Secondary | ICD-10-CM | POA: Diagnosis present

## 2019-06-11 DIAGNOSIS — I69322 Dysarthria following cerebral infarction: Secondary | ICD-10-CM | POA: Diagnosis not present

## 2019-06-11 DIAGNOSIS — Q740 Other congenital malformations of upper limb(s), including shoulder girdle: Secondary | ICD-10-CM | POA: Diagnosis not present

## 2019-06-11 DIAGNOSIS — I129 Hypertensive chronic kidney disease with stage 1 through stage 4 chronic kidney disease, or unspecified chronic kidney disease: Secondary | ICD-10-CM | POA: Diagnosis not present

## 2019-06-11 DIAGNOSIS — I2510 Atherosclerotic heart disease of native coronary artery without angina pectoris: Secondary | ICD-10-CM | POA: Diagnosis not present

## 2019-06-11 DIAGNOSIS — M109 Gout, unspecified: Secondary | ICD-10-CM | POA: Diagnosis not present

## 2019-06-11 DIAGNOSIS — N1831 Chronic kidney disease, stage 3a: Secondary | ICD-10-CM | POA: Diagnosis not present

## 2019-06-11 DIAGNOSIS — Z23 Encounter for immunization: Secondary | ICD-10-CM | POA: Diagnosis not present

## 2019-06-11 DIAGNOSIS — E1122 Type 2 diabetes mellitus with diabetic chronic kidney disease: Secondary | ICD-10-CM | POA: Diagnosis not present

## 2019-06-11 DIAGNOSIS — I63032 Cerebral infarction due to thrombosis of left carotid artery: Secondary | ICD-10-CM | POA: Diagnosis not present

## 2019-06-11 DIAGNOSIS — R402142 Coma scale, eyes open, spontaneous, at arrival to emergency department: Secondary | ICD-10-CM | POA: Diagnosis present

## 2019-06-11 DIAGNOSIS — Z79899 Other long term (current) drug therapy: Secondary | ICD-10-CM | POA: Diagnosis not present

## 2019-06-11 DIAGNOSIS — G9341 Metabolic encephalopathy: Secondary | ICD-10-CM | POA: Diagnosis not present

## 2019-06-11 DIAGNOSIS — Z951 Presence of aortocoronary bypass graft: Secondary | ICD-10-CM | POA: Diagnosis not present

## 2019-06-11 DIAGNOSIS — J45909 Unspecified asthma, uncomplicated: Secondary | ICD-10-CM | POA: Diagnosis present

## 2019-06-11 DIAGNOSIS — E119 Type 2 diabetes mellitus without complications: Secondary | ICD-10-CM | POA: Diagnosis not present

## 2019-06-11 DIAGNOSIS — R402242 Coma scale, best verbal response, confused conversation, at arrival to emergency department: Secondary | ICD-10-CM | POA: Diagnosis present

## 2019-06-11 DIAGNOSIS — R29703 NIHSS score 3: Secondary | ICD-10-CM | POA: Diagnosis present

## 2019-06-11 DIAGNOSIS — D62 Acute posthemorrhagic anemia: Secondary | ICD-10-CM | POA: Diagnosis not present

## 2019-06-11 DIAGNOSIS — R296 Repeated falls: Secondary | ICD-10-CM | POA: Diagnosis present

## 2019-06-11 DIAGNOSIS — Z96652 Presence of left artificial knee joint: Secondary | ICD-10-CM | POA: Diagnosis present

## 2019-06-11 DIAGNOSIS — N1832 Chronic kidney disease, stage 3b: Secondary | ICD-10-CM | POA: Diagnosis not present

## 2019-06-11 DIAGNOSIS — E46 Unspecified protein-calorie malnutrition: Secondary | ICD-10-CM | POA: Diagnosis not present

## 2019-06-11 DIAGNOSIS — D631 Anemia in chronic kidney disease: Secondary | ICD-10-CM | POA: Diagnosis present

## 2019-06-11 DIAGNOSIS — I25810 Atherosclerosis of coronary artery bypass graft(s) without angina pectoris: Secondary | ICD-10-CM | POA: Diagnosis not present

## 2019-06-11 DIAGNOSIS — E8809 Other disorders of plasma-protein metabolism, not elsewhere classified: Secondary | ICD-10-CM | POA: Diagnosis not present

## 2019-06-11 DIAGNOSIS — E11649 Type 2 diabetes mellitus with hypoglycemia without coma: Secondary | ICD-10-CM | POA: Diagnosis not present

## 2019-06-11 DIAGNOSIS — R7309 Other abnormal glucose: Secondary | ICD-10-CM | POA: Diagnosis not present

## 2019-06-11 DIAGNOSIS — R471 Dysarthria and anarthria: Secondary | ICD-10-CM | POA: Diagnosis not present

## 2019-06-11 LAB — GLUCOSE, CAPILLARY
Glucose-Capillary: 92 mg/dL (ref 70–99)
Glucose-Capillary: 77 mg/dL (ref 70–99)
Glucose-Capillary: 104 mg/dL — ABNORMAL HIGH (ref 70–99)
Glucose-Capillary: 87 mg/dL (ref 70–99)

## 2019-06-11 LAB — LIPID PANEL
Cholesterol: 123 mg/dL (ref 0–200)
HDL: 35 mg/dL — ABNORMAL LOW (ref >40)
LDL Cholesterol: 66 mg/dL (ref 0–99)
Total CHOL/HDL Ratio: 3.5 RATIO
Triglycerides: 110 mg/dL (ref <150)
VLDL: 22 mg/dL (ref 0–40)

## 2019-06-11 LAB — HEMOGLOBIN A1C
Hgb A1c MFr Bld: 5.8 % — ABNORMAL HIGH (ref 4.8–5.6)
Mean Plasma Glucose: 119.76 mg/dL

## 2019-06-11 LAB — ECHOCARDIOGRAM COMPLETE
Height: 70 in
Weight: 2984.15 oz

## 2019-06-11 MED ORDER — SODIUM CHLORIDE 0.9 % IV SOLN
INTRAVENOUS | Status: DC
  Administered 2019-06-11 – 2019-06-12 (×2): via INTRAVENOUS

## 2019-06-11 MED ORDER — PERFLUTREN LIPID MICROSPHERE
1.0000 mL | INTRAVENOUS | Status: AC | PRN
  Administered 2019-06-11: 2 mL via INTRAVENOUS
  Filled 2019-06-11: qty 10

## 2019-06-11 MED ORDER — ASPIRIN 325 MG PO TABS
325.0000 mg | ORAL_TABLET | Freq: Every day | ORAL | Status: DC
  Administered 2019-06-11: 16:00:00 325 mg via ORAL

## 2019-06-11 MED ORDER — CLOPIDOGREL BISULFATE 75 MG PO TABS
75.0000 mg | ORAL_TABLET | Freq: Every day | ORAL | Status: DC
  Administered 2019-06-11 – 2019-06-17 (×7): 75 mg via ORAL
  Filled 2019-06-11 (×7): qty 1

## 2019-06-11 MED ORDER — SODIUM CHLORIDE 0.9 % IV SOLN: INTRAVENOUS | Status: DC

## 2019-06-11 MED ORDER — ASPIRIN EC 81 MG PO TBEC
81.0000 mg | DELAYED_RELEASE_TABLET | Freq: Every day | ORAL | Status: DC
  Administered 2019-06-12 – 2019-06-17 (×6): 81 mg via ORAL
  Filled 2019-06-11 (×6): qty 1

## 2019-06-11 NOTE — Progress Notes (Signed)
Arrived from [place] at 2100. Alert and oriented to self intermittently. Denies any pain. Call light within reach.

## 2019-06-11 NOTE — Progress Notes (Signed)
  Echocardiogram 2D Echocardiogram has been performed.  Yasuko Lapage L Androw 06/11/2019, 10:44 AM

## 2019-06-11 NOTE — Evaluation (Signed)
Physical Therapy Evaluation Patient Details Name: [name] MRN: [id number] DOB: [birth year] Today's Date: 06/11/2019   History of Present Illness  83yo male brought to the ED due to garbled speech and confusion/AMS. 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 Not a tpa candidate per neuro. PMH CAD, DM, gout, HOH, HTN, anxiety, CABG, hx R TKR  Clinical Impression   Patient received in bed, pleasant but extremely HOH; able to speak without slurred or garbled speech this morning but still seems confused as he often gave off topic answers and  perseverated on his wife being in the bathroom and other aspects around the bathroom in general this morning. Educated that he has catheter and can toilet in this, gave verbal understanding however had to be re-educated multiple times. Attempted written communication due to [facility] however continued to get off topic answers and perseveration from patient. Initially required MinA for supine to sit, quickly faded to min guard. Patient quite impulsive once at EOB and stood with min guard/attempted to walk with RW before PT had all equipment/lines and leads ready, required MAX VC and TC as well as extended time to get patient to sit back down at EOB. Feel that he will require +2 assist for safety with gait due to impulsivity, difficulty communicating with patient, and lines/leads. He was returned to bed with ModA and positioned to comfort with [place]. He was left in bed with bed alarm active and ECHO staff present and attending. He will continue to benefit from skilled PT services in the acute setting- currently recommending HHPT with 24/7 assist at home, however would recommend SNF if family cannot provide 24/7 assist/S.     Follow Up Recommendations Home health PT;Supervision/Assistance - 24 hour;Other (comment)(SNF if family can not provide consistent 24/7 assist)    Equipment  Recommendations  Rolling walker with 5" wheels;3in1 (PT)    Recommendations for Other Services       Precautions / Restrictions Precautions Precautions: Fall;Other (comment) Precaution Comments: HOH, seems to also have a mix of expressive difficult and confusion Restrictions Weight Bearing Restrictions: No      Mobility  Bed Mobility Overal bed mobility: Needs Assistance Bed Mobility: Supine to Sit;Sit to Supine     Supine to sit: Min assist;Min guard Sit to supine: Mod assist   General bed mobility comments: MinA to initiate movement to EOB, then faded to min guard; required ModA for back to bed, Max VC  Transfers Overall transfer level: Needs assistance Equipment used: Rolling walker (2 wheeled) Transfers: Sit to/from Stand Sit to Stand: Min guard         General transfer comment: impulsive, stood before PT ready with equipment, tried to walk in unsafe matter (before PT or lines/equipment ready), MAX VC and TC and extended time to return to sitting at EOB  Ambulation/Gait             General Gait Details: DNT- will require +2 for safety due to lines/leads and impulsivity  Stairs            Wheelchair Mobility    Modified Rankin (Stroke Patients Only)       Balance Overall balance assessment: Needs assistance Sitting-balance support: Bilateral upper extremity supported;Feet supported Sitting balance-Leahy Scale: Good     Standing balance support: Bilateral upper extremity supported;During functional activity Standing balance-Leahy Scale: Fair Standing balance comment: reliant on external support  Pertinent Vitals/Pain Pain Assessment: Faces Pain Score: 0-No pain Faces Pain Scale: No hurt Pain Intervention(s): Limited activity within patient's tolerance;Monitored during session    Home Living Family/patient expects to be discharged to:: Unsure                 Additional Comments: patient not  able to provide information, family not present to clarify    Prior Function           Comments: patient not able to provide information, family not present to clarify     Hand Dominance        Extremity/Trunk Assessment   Upper Extremity Assessment Upper Extremity Assessment: Defer to OT evaluation    Lower Extremity Assessment Lower Extremity Assessment: Difficult to assess due to impaired cognition    Cervical / Trunk Assessment Cervical / Trunk Assessment: Kyphotic  Communication   Communication: Expressive difficulties;HOH  Cognition Arousal/Alertness: Awake/alert Behavior During Therapy: Impulsive Overall Cognitive Status: Impaired/Different from baseline Area of Impairment: Orientation;Attention;Memory;Following commands;Safety/judgement;Awareness;Problem solving                 Orientation Level: Disoriented to;Place;Time;Situation Current Attention Level: Sustained Memory: Decreased short-term memory Following Commands: Follows one step commands inconsistently Safety/Judgement: Decreased awareness of safety;Decreased awareness of deficits Awareness: Intellectual Problem Solving: Slow processing;Decreased initiation;Difficulty sequencing;Requires verbal cues;Requires tactile cues General Comments: difficult to assess due to difficulty communicating with patient- tried yelling and tried writing communication. Spoken communication limited by [facility] and confusion, written communication limited by confusion.      General Comments      Exercises     Assessment/Plan    PT Assessment Patient needs continued PT services  PT Problem List Decreased strength;Decreased mobility;Decreased safety awareness;Decreased coordination;Decreased knowledge of precautions;Decreased cognition;Decreased balance;Decreased knowledge of use of DME       PT Treatment Interventions DME instruction;Therapeutic activities;Cognitive remediation;Gait training;Therapeutic  exercise;Patient/family education;Stair training;Balance training;Functional mobility training;Neuromuscular re-education    PT Goals (Current goals can be found in the Care Plan section)  Acute Rehab PT Goals PT Goal Formulation: Patient unable to participate in goal setting    Frequency Min 3X/week   Barriers to discharge        Co-evaluation               AM-PAC PT "6 Clicks" Mobility  Outcome Measure Help needed turning from your back to your side while in a flat bed without using bedrails?: A Little Help needed moving from lying on your back to sitting on the side of a flat bed without using bedrails?: A Little Help needed moving to and from a bed to a chair (including a wheelchair)?: A Little Help needed standing up from a chair using your arms (e.g., wheelchair or bedside chair)?: A Little Help needed to walk in hospital room?: A Little Help needed climbing 3-5 steps with a railing? : A Lot 6 Click Score: 17    End of Session Equipment Utilized During Treatment: Gait belt Activity Tolerance: Patient tolerated treatment well Patient left: in bed;with call bell/phone within reach;with bed alarm set;Other (comment)(ECHO staff present and attending)   PT Visit Diagnosis: Unsteadiness on feet (R26.81);Muscle weakness (generalized) (M62.81);Difficulty in walking, not elsewhere classified (R26.2);Other symptoms and signs involving the nervous system (R29.898)    Time: 0630-1601 PT Time Calculation (min) (ACUTE ONLY): 32 min   Charges:   PT Evaluation $PT Eval Moderate Complexity: 1 Mod PT Treatments $Therapeutic Activity: 8-22 mins        Deniece Ree PT, DPT,  CBIS  Supplemental Physical Therapist [place]    Pager [phone] Acute Rehab Office [phone]

## 2019-06-11 NOTE — Progress Notes (Signed)
Progress Note    [name]  FGH:[id number] DOB: 16-Apr-1936  DOA: 06/10/2019 PCP: [name], MD    Brief Narrative:     Medical records reviewed and are as summarized below:  [name] is an 83 y.o. male with medical history significant for hypertension, diabetes mellitus, asthma, coronary artery disease, who was brought to the ED by EMS, reports of confusion and garbled speech.    Assessment/Plan:   Principal Problem:   Speech abnormality Active Problems:   CVA (cerebral vascular accident) ([place])   CVA -Reported initial garbled speech -CTA neck shows occlusion involving the origin of left vertebral artery, right common carotid artery, proximal right ICA and left ICA. -325mg  aspirin daily.  -Echocardiogram -Lipid panel: LDL: 66 -hemoglobin A1c pending -statins -PT/OT: pending eval -neurology to see and make recommendations   Metabolic encephalopathy-reported increased sleepiness/drowsiness for several days.  -Hold home PRN Xanax.  Hypertension-systolic 678L to 381O. -Hold home lisinopril, atenolol, Lasix, Norvasc 5 mg, allow for permissive hypertension in the setting of probable CVA.  Diabetes mellitus -Resume home glipizide - SSI  Anemia -several recent falls -CT scan abd/pelvis done-- ? Thickened esophagus-- referred to Dr. Oneida Alar  Asthma- Stable. -PRN albuterol  Coronary artery disease- Stable.  Three-vessel bypass 1997.  -Aspirin, statins.  CKD 3 - normal GFR 38 per wife -Will hydrate and hold lisinopril for contrast exposure.  - N/s 100cc/hr x 15 hrs.   Family Communication/Anticipated D/C date and plan/Code Status   DVT prophylaxis: Lovenox ordered. Code Status: Full Code.  Family Communication: spoke with wife Disposition Plan: pending neuro recs   Medical Consultants:    Neuro    Subjective:   sleepy  Objective:    Vitals:   06/11/19 0300 06/11/19 0500 06/11/19 0656 06/11/19 0809  BP: 134/78 (!)  161/52 (!) 158/57 (!) 173/60  Pulse: 61 63 63 63  Resp: 16 14 17 14   Temp: 98.4 F (36.9 C) 98.3 F (36.8 C) 98.7 F (37.1 C) 98.6 F (37 C)  TempSrc: Oral Oral Oral Oral  SpO2: 97% 98% 99% 98%  Weight:      Height:       No intake or output data in the 24 hours ending 06/11/19 1037 Filed Weights   06/10/19 1356 06/10/19 2109  Weight: 80 kg 84.6 kg    Exam: In bed, hard of hearing rrr No increased work of breathing Few bruises   Data Reviewed:   I have personally reviewed following labs and imaging studies:  Labs: Labs show the following:   Basic Metabolic Panel: Recent Labs  Lab 06/04/19 1625 06/10/19 1441  NA 138 132*  K 3.7 3.7  CL 103 100  CO2 25 22  GLUCOSE 201* 215*  BUN 31* 36*  CREATININE 1.68* 1.98*  CALCIUM 10.2 8.9   GFR Estimated Creatinine Clearance: 30.7 mL/min (A) (by C-G formula based on SCr of 1.98 mg/dL (H)). Liver Function Tests: Recent Labs  Lab 06/04/19 1625  AST 18  ALT 13  ALKPHOS 55  BILITOT 0.6  PROT 8.1  ALBUMIN 4.2   Recent Labs  Lab 06/04/19 1625  LIPASE 20   No results for input(s): AMMONIA in the last 168 hours. Coagulation profile Recent Labs  Lab 06/10/19 1441  INR 1.1    CBC: Recent Labs  Lab 06/04/19 1625 06/10/19 1441  WBC 9.0 8.2  NEUTROABS  --  5.9  HGB 12.3* 10.2*  HCT 38.6* 32.9*  MCV 90.4 92.4  PLT 310 319  Cardiac Enzymes: Recent Labs  Lab 06/04/19 1919  CKTOTAL 67   BNP (last 3 results) No results for input(s): PROBNP in the last 8760 hours. CBG: Recent Labs  Lab 06/04/19 1611 06/10/19 1435 06/10/19 2212 06/11/19 0654  GLUCAP 192* 209* 81 92   D-Dimer: No results for input(s): DDIMER in the last 72 hours. Hgb A1c: Recent Labs    06/11/19 0851  HGBA1C 5.8*   Lipid Profile: Recent Labs    06/11/19 0851  CHOL 123  HDL 35*  LDLCALC 66  TRIG 110  CHOLHDL 3.5   Thyroid function studies: No results for input(s): TSH, T4TOTAL, T3FREE, THYROIDAB in the last 72  hours.  Invalid input(s): FREET3 Anemia work up: No results for input(s): VITAMINB12, FOLATE, FERRITIN, TIBC, IRON, RETICCTPCT in the last 72 hours. Sepsis Labs: Recent Labs  Lab 06/04/19 1625 06/04/19 1919 06/10/19 1441 06/10/19 1632  WBC 9.0  --  8.2  --   LATICACIDVEN  --  1.1 1.5 0.9    Microbiology Recent Results (from the past 240 hour(s))  SARS Coronavirus 2 [facility] order, Performed in [facility] hospital lab) Nasopharyngeal Nasopharyngeal Swab     Status: None   Collection Time: 06/10/19  6:29 PM   Specimen: Nasopharyngeal Swab  Result Value Ref Range Status   SARS Coronavirus 2 NEGATIVE NEGATIVE Final    Comment: (NOTE) If result is NEGATIVE SARS-CoV-2 target nucleic acids are NOT DETECTED. The SARS-CoV-2 RNA is generally detectable in upper and lower  respiratory specimens during the acute phase of infection. The lowest  concentration of SARS-CoV-2 viral copies this assay can detect is 250  copies / mL. A negative result does not preclude SARS-CoV-2 infection  and should not be used as the sole basis for treatment or other  patient management decisions.  A negative result may occur with  improper specimen collection / handling, submission of specimen other  than nasopharyngeal swab, presence of viral mutation(s) within the  areas targeted by this assay, and inadequate number of viral copies  (<250 copies / mL). A negative result must be combined with clinical  observations, patient history, and epidemiological information. If result is POSITIVE SARS-CoV-2 target nucleic acids are DETECTED. The SARS-CoV-2 RNA is generally detectable in upper and lower  respiratory specimens dur ing the acute phase of infection.  Positive  results are indicative of active infection with SARS-CoV-2.  Clinical  correlation with patient history and other diagnostic information is  necessary to determine patient infection status.  Positive results do  not rule out bacterial  infection or co-infection with other viruses. If result is PRESUMPTIVE POSTIVE SARS-CoV-2 nucleic acids MAY BE PRESENT.   A presumptive positive result was obtained on the submitted specimen  and confirmed on repeat testing.  While 2019 novel coronavirus  (SARS-CoV-2) nucleic acids may be present in the submitted sample  additional confirmatory testing may be necessary for epidemiological  and / or clinical management purposes  to differentiate between  SARS-CoV-2 and other Sarbecovirus currently known to infect humans.  If clinically indicated additional testing with an alternate test  methodology [id number]) is advised. The SARS-CoV-2 RNA is generally  detectable in upper and lower respiratory sp ecimens during the acute  phase of infection. The expected result is Negative. Fact Sheet for Patients:  StrictlyIdeas.no Fact Sheet for Healthcare Providers: BankingDealers.co.za This test is not yet approved or cleared by the [place] FDA and has been authorized for detection and/or diagnosis of SARS-CoV-2 by FDA under an Emergency Use  Authorization (EUA).  This EUA will remain in effect (meaning this test can be used) for the duration of the COVID-19 declaration under Section 564(b)(1) of the Act, 21 U.S.C. section 360bbb-3(b)(1), unless the authorization is terminated or revoked sooner. Performed at [facility], [address] [place], [place] [address]     Procedures and diagnostic studies:  Ct Angio Head W Or Wo Contrast  Result Date: 06/10/2019 CLINICAL DATA:  Acute altered mental status/confusion. Speech disturbance. EXAM: CT ANGIOGRAPHY HEAD AND NECK CT PERFUSION BRAIN TECHNIQUE: Multidetector CT imaging of the head and neck was performed using the standard protocol during bolus administration of intravenous contrast. Multiplanar CT image reconstructions and MIPs were obtained to evaluate the vascular anatomy. Carotid stenosis  measurements (when applicable) are obtained utilizing NASCET criteria, using the distal internal carotid diameter as the denominator. Multiphase CT imaging of the brain was performed following IV bolus contrast injection. Subsequent parametric perfusion maps were calculated using RAPID software. CONTRAST:  139mL OMNIPAQUE IOHEXOL 350 MG/ML SOLN COMPARISON:  None. FINDINGS: CTA NECK FINDINGS Aortic arch: Standard 3 vessel aortic arch with moderate atherosclerotic plaque. Calcified plaque at both subclavian artery origins does not result in significant stenosis. Right carotid system: Patent with predominantly calcified plaque about the carotid bifurcation resulting in approximately 80% distal common carotid artery stenosis an approximately 60% proximal ICA stenosis. Retropharyngeal course of the proximal ICA. Moderate ECA origin stenosis. Left carotid system: Patent with predominantly calcified plaque about the carotid bifurcation resulting in 80% proximal ICA stenosis. Partially retropharyngeal course of the proximal ICA. Vertebral arteries: The left vertebral artery is patent with a moderate to severe origin stenosis due to calcified plaque. The right vertebral artery is occluded at its origin with reconstitution of only a small portion of the V2 segment. Skeleton: No fracture or focal osseous lesion. Moderate to severe multilevel cervical disc and facet degeneration. Interbody and left-sided facet ankylosis at C4-5. Other neck: No evidence of cervical lymphadenopathy or mass. Upper chest: Mild apical lung scarring. Trace right pleural effusion. Review of the MIP images confirms the above findings CTA HEAD FINDINGS Anterior circulation: The internal carotid arteries are patent from skull base to carotid termini with relatively diffuse calcified plaque resulting in moderate right and mild left proximal supraclinoid stenoses. ACAs and MCAs are patent without evidence of proximal branch occlusion or significant  proximal stenosis. Posterior circulation: The intracranial left vertebral artery is widely patent and supplies the basilar. Opacification of a small right V4 segment is likely retrograde. Patent left PICA, right AICA, and bilateral SCA origins are identified. The basilar artery is widely patent. There is a fetal origin of the left PCA. Moderate proximal right P2, severe distal left P2, and severe left greater than right P3 stenoses are present. No aneurysm is identified. Venous sinuses: Patent. Anatomic variants: Fetal left PCA. Review of the MIP images confirms the above findings CT Brain Perfusion Findings: ASPECTS: 10 CBF (<30%) Volume: 74mL Perfusion (Tmax>6.0s) volume: 28mL Mismatch Volume: 63mL Infarction Location: None evident by CTP IMPRESSION: 1. Occluded right vertebral artery. 2. Patent left vertebral artery with moderate to severe stenosis of its origin. 3. 80% distal right common carotid artery stenosis and 60% proximal right ICA stenosis. 4. 80% proximal left ICA stenosis. 5. Moderate right and mild left intracranial ICA stenoses. 6. Negative CTP. 7.  Aortic Atherosclerosis (ICD10-I70.0). These results were called by telephone at the time of interpretation on 06/10/2019 at 2:47 pm to Dr. Veryl Speak , who verbally acknowledged these results. Electronically Signed  By: Logan Bores M.D.   On: 06/10/2019 15:18   Ct Head Wo Contrast  Result Date: 06/10/2019 CLINICAL DATA:  Acute altered mental status/confusion. Improving speech abnormality. EXAM: CT HEAD WITHOUT CONTRAST TECHNIQUE: Contiguous axial images were obtained from the base of the skull through the vertex without intravenous contrast. COMPARISON:  06/04/2019 FINDINGS: Brain: There is no evidence of acute infarct, intracranial hemorrhage, mass, midline shift, or extra-axial fluid collection. 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 Small chronic cortical/subcortical infarcts are also again seen  in the right parietal and left occipital lobes. Patchy hypodensities elsewhere in the cerebral white matter bilaterally are unchanged and nonspecific but compatible with mild-to-moderate chronic small vessel ischemic disease. There is mild cerebral atrophy. Vascular: Calcified atherosclerosis at the skull base. No hyperdense vessel. Skull: No fracture or focal osseous lesion. Sinuses/Orbits: Mild right sphenoid sinus mucosal thickening. Clear mastoid air cells. Bilateral cataract extraction. Other: None. IMPRESSION: 1. No evidence of acute intracranial abnormality.  ASPECTS of 10. 2. Chronic small vessel ischemia with small chronic infarcts as above. These results were called by telephone at the time of interpretation on 06/10/2019 at 2:16 pm to Dr. Veryl Speak , who verbally acknowledged these results. Electronically Signed   By: Logan Bores M.D.   On: 06/10/2019 14:18   Ct Angio Neck W And/or Wo Contrast  Result Date: 06/10/2019 CLINICAL DATA:  Acute altered mental status/confusion. Speech disturbance. EXAM: CT ANGIOGRAPHY HEAD AND NECK CT PERFUSION BRAIN TECHNIQUE: Multidetector CT imaging of the head and neck was performed using the standard protocol during bolus administration of intravenous contrast. Multiplanar CT image reconstructions and MIPs were obtained to evaluate the vascular anatomy. Carotid stenosis measurements (when applicable) are obtained utilizing NASCET criteria, using the distal internal carotid diameter as the denominator. Multiphase CT imaging of the brain was performed following IV bolus contrast injection. Subsequent parametric perfusion maps were calculated using RAPID software. CONTRAST:  150mL OMNIPAQUE IOHEXOL 350 MG/ML SOLN COMPARISON:  None. FINDINGS: CTA NECK FINDINGS Aortic arch: Standard 3 vessel aortic arch with moderate atherosclerotic plaque. Calcified plaque at both subclavian artery origins does not result in significant stenosis. Right carotid system: Patent with  predominantly calcified plaque about the carotid bifurcation resulting in approximately 80% distal common carotid artery stenosis an approximately 60% proximal ICA stenosis. Retropharyngeal course of the proximal ICA. Moderate ECA origin stenosis. Left carotid system: Patent with predominantly calcified plaque about the carotid bifurcation resulting in 80% proximal ICA stenosis. Partially retropharyngeal course of the proximal ICA. Vertebral arteries: The left vertebral artery is patent with a moderate to severe origin stenosis due to calcified plaque. The right vertebral artery is occluded at its origin with reconstitution of only a small portion of the V2 segment. Skeleton: No fracture or focal osseous lesion. Moderate to severe multilevel cervical disc and facet degeneration. Interbody and left-sided facet ankylosis at C4-5. Other neck: No evidence of cervical lymphadenopathy or mass. Upper chest: Mild apical lung scarring. Trace right pleural effusion. Review of the MIP images confirms the above findings CTA HEAD FINDINGS Anterior circulation: The internal carotid arteries are patent from skull base to carotid termini with relatively diffuse calcified plaque resulting in moderate right and mild left proximal supraclinoid stenoses. ACAs and MCAs are patent without evidence of proximal branch occlusion or significant proximal stenosis. Posterior circulation: The intracranial left vertebral artery is widely patent and supplies the basilar. Opacification of a small right V4 segment is likely retrograde. Patent left PICA,  right AICA, and bilateral SCA origins are identified. The basilar artery is widely patent. There is a fetal origin of the left PCA. Moderate proximal right P2, severe distal left P2, and severe left greater than right P3 stenoses are present. No aneurysm is identified. Venous sinuses: Patent. Anatomic variants: Fetal left PCA. Review of the MIP images confirms the above findings CT Brain Perfusion  Findings: ASPECTS: 10 CBF (<30%) Volume: 69mL Perfusion (Tmax>6.0s) volume: 67mL Mismatch Volume: 88mL Infarction Location: None evident by CTP IMPRESSION: 1. Occluded right vertebral artery. 2. Patent left vertebral artery with moderate to severe stenosis of its origin. 3. 80% distal right common carotid artery stenosis and 60% proximal right ICA stenosis. 4. 80% proximal left ICA stenosis. 5. Moderate right and mild left intracranial ICA stenoses. 6. Negative CTP. 7.  Aortic Atherosclerosis (ICD10-I70.0). These results were called by telephone at the time of interpretation on 06/10/2019 at 2:47 pm to Dr. Veryl Speak , who verbally acknowledged these results. Electronically Signed   By: Logan Bores M.D.   On: 06/10/2019 15:18   Mr Brain Wo Contrast  Result Date: 06/11/2019 CLINICAL DATA:  Acute onset garbled speech EXAM: MRI HEAD WITHOUT CONTRAST TECHNIQUE: Multiplanar, multiecho pulse sequences of the brain and surrounding structures were obtained without intravenous contrast. COMPARISON:  Head CT, CTA, and CT perfusion from yesterday FINDINGS: Brain: Small areas of restricted diffusion along the medial right thalamus and right corona radiata. Remote lacunar infarct with mineralization at the right basal ganglia. Small remote left occipital cortex infarct. Moderate chronic small vessel ischemic type gliosis in the cerebral white matter. Mild-to-moderate generalized volume loss. No acute hemorrhage, hydrocephalus, or masslike finding. Vascular: Absent right vertebral flow void which correlates with CTA. Other major flow voids are preserved. Skull and upper cervical spine: Negative for marrow lesion Sinuses/Orbits: No emergent finding. There is minimal left mastoid opacification with negative nasopharynx. Bilateral cataract resection. IMPRESSION: 1. Small acute infarcts at the medial right thalamus and right internal capsule. 2. Chronic ischemic injury including remote right basal ganglia infarct and left  occipital cortex infarct. Electronically Signed   By: Monte Fantasia M.D.   On: 06/11/2019 04:35   Ct Cerebral Perfusion W Contrast  Result Date: 06/10/2019 CLINICAL DATA:  Acute altered mental status/confusion. Speech disturbance. EXAM: CT ANGIOGRAPHY HEAD AND NECK CT PERFUSION BRAIN TECHNIQUE: Multidetector CT imaging of the head and neck was performed using the standard protocol during bolus administration of intravenous contrast. Multiplanar CT image reconstructions and MIPs were obtained to evaluate the vascular anatomy. Carotid stenosis measurements (when applicable) are obtained utilizing NASCET criteria, using the distal internal carotid diameter as the denominator. Multiphase CT imaging of the brain was performed following IV bolus contrast injection. Subsequent parametric perfusion maps were calculated using RAPID software. CONTRAST:  116mL OMNIPAQUE IOHEXOL 350 MG/ML SOLN COMPARISON:  None. FINDINGS: CTA NECK FINDINGS Aortic arch: Standard 3 vessel aortic arch with moderate atherosclerotic plaque. Calcified plaque at both subclavian artery origins does not result in significant stenosis. Right carotid system: Patent with predominantly calcified plaque about the carotid bifurcation resulting in approximately 80% distal common carotid artery stenosis an approximately 60% proximal ICA stenosis. Retropharyngeal course of the proximal ICA. Moderate ECA origin stenosis. Left carotid system: Patent with predominantly calcified plaque about the carotid bifurcation resulting in 80% proximal ICA stenosis. Partially retropharyngeal course of the proximal ICA. Vertebral arteries: The left vertebral artery is patent with a moderate to severe origin stenosis due to calcified plaque. The right vertebral artery is occluded  at its origin with reconstitution of only a small portion of the V2 segment. Skeleton: No fracture or focal osseous lesion. Moderate to severe multilevel cervical disc and facet degeneration.  Interbody and left-sided facet ankylosis at C4-5. Other neck: No evidence of cervical lymphadenopathy or mass. Upper chest: Mild apical lung scarring. Trace right pleural effusion. Review of the MIP images confirms the above findings CTA HEAD FINDINGS Anterior circulation: The internal carotid arteries are patent from skull base to carotid termini with relatively diffuse calcified plaque resulting in moderate right and mild left proximal supraclinoid stenoses. ACAs and MCAs are patent without evidence of proximal branch occlusion or significant proximal stenosis. Posterior circulation: The intracranial left vertebral artery is widely patent and supplies the basilar. Opacification of a small right V4 segment is likely retrograde. Patent left PICA, right AICA, and bilateral SCA origins are identified. The basilar artery is widely patent. There is a fetal origin of the left PCA. Moderate proximal right P2, severe distal left P2, and severe left greater than right P3 stenoses are present. No aneurysm is identified. Venous sinuses: Patent. Anatomic variants: Fetal left PCA. Review of the MIP images confirms the above findings CT Brain Perfusion Findings: ASPECTS: 10 CBF (<30%) Volume: 49mL Perfusion (Tmax>6.0s) volume: 39mL Mismatch Volume: 58mL Infarction Location: None evident by CTP IMPRESSION: 1. Occluded right vertebral artery. 2. Patent left vertebral artery with moderate to severe stenosis of its origin. 3. 80% distal right common carotid artery stenosis and 60% proximal right ICA stenosis. 4. 80% proximal left ICA stenosis. 5. Moderate right and mild left intracranial ICA stenoses. 6. Negative CTP. 7.  Aortic Atherosclerosis (ICD10-I70.0). These results were called by telephone at the time of interpretation on 06/10/2019 at 2:47 pm to Dr. Veryl Speak , who verbally acknowledged these results. Electronically Signed   By: Logan Bores M.D.   On: 06/10/2019 15:18    Medications:    allopurinol  300 mg Oral QHS    atorvastatin  20 mg Oral QHS   enoxaparin (LOVENOX) injection  40 mg Subcutaneous Q24H   famotidine  20 mg Oral Daily   glipiZIDE  5 mg Oral q morning - 10a   insulin aspart  0-5 Units Subcutaneous QHS   insulin aspart  0-9 Units Subcutaneous TID WC   loratadine  10 mg Oral Daily   Continuous Infusions:  sodium chloride 100 mL/hr at 06/11/19 0054     LOS: 0 days   Geradine Girt  [facility]   How to contact the [facility] Attending or Consulting provider [place] or covering provider during after hours [place], for this patient?  1. Check the care team in [facility] and look for a) attending/consulting [facility] provider listed and b) the [facility] team listed 2. Log into [email or web address] and use [place]'s universal password to access. If you do not have the password, please contact the hospital operator. 3. Locate the [facility] provider you are looking for under [facility] and page to a number that you can be directly reached. 4. If you still have difficulty reaching the provider, please page the [facility] (Director on Call) for the Hospitalists listed on amion for assistance.  06/11/2019, 10:37 AM

## 2019-06-11 NOTE — Progress Notes (Signed)
CODE STROKE CT TIMES 1321 Call time-EMS 10 minutes out [id number] Exam started 3462 Exam finished 1354 Images sent to [facility] 1358 Exam completed in [place] [facility] called

## 2019-06-11 NOTE — Progress Notes (Signed)
Carotid duplex       has been completed. Preliminary results can be found under CV proc through chart review. Chesnie Capell, BS, RDMS, RVT   

## 2019-06-11 NOTE — Consult Note (Addendum)
Stroke Neurology Consultation Note  Consult Requested by: [facility], seen by teleneurology at [facility]   Reason for Consult: stroke  Consult Date:  06/11/19  The history was obtained from the pt, wife and chart.  During history and examination, all items were able to be obtained unless otherwise noted.  History of Present Illness:  [name] is an 83 y.o. Caucasian male with PMH of HTN, DB, CAD, asthma  .  History is obtained from the patient's wife and patient is unable to provide history.  I have also reviewed electronic medical records and imaging films personally.  Patient apparently fell at home 10 days ago but wife stated he did not lose consciousness or have significant head injury.  But following that he was slightly off balance and did not feel well he was nauseous and threw up for a couple of days he was seen in the ER at any pain on 06/04/2019 and diagnosed with esophagitis.  His admission labs showed creatinine of 1.68.  Patient was discharged home and yesterday had sudden onset of confusion and slurred speech and went back to [facility] and was transferred to [facility] for further evaluation.  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  80% distal right common carotid artery and 60% proximal right ICA stenosis.  80% proximal left ICA stenosis with moderate right ICA and mild left intracranial ICA stenosis.  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  He is extremely hard of hearing and when he fell 10 days ago he broke 1 of his ear pieces and since then he has had difficulty time hearing and understanding.  Date last known well: Unable to determine Time last known well: Unable to determine tPA Given: No: unable to determine LKW MRS:   2 NIHSS:  3  Past Medical History:  Diagnosis Date  . Anxiety   . Arthritis   . Asthma   . Coronary artery disease   . Diabetes mellitus without complication ([place])   . Gout   . HOH (hard of hearing) bilateral  . Hypertension   . PONV (postoperative nausea and vomiting)      Past Surgical History:  Procedure Laterality Date  . CATARACT EXTRACTION W/PHACO  11/28/2011   Procedure: CATARACT EXTRACTION PHACO AND INTRAOCULAR LENS PLACEMENT (IOC);  Surgeon: Williams Che, MD;  Location: [facility];  Service: Ophthalmology;  Laterality: Right;  CDE 3.84  . CORONARY ARTERY BYPASS GRAFT  1997   6 vessels  . EYE SURGERY  1995   left eye cataract removal   . TOTAL KNEE  PROSTHESIS REMOVAL W/ SPACER INSERTION  8 yrs ago   right    Family History  Problem Relation Age of Onset  . Anesthesia problems Neg Hx   . Hypotension Neg Hx   . Malignant hyperthermia Neg Hx   . Pseudochol deficiency Neg Hx      Social History:  reports that he has never smoked. He has never used smokeless tobacco. He reports that he does not drink alcohol or use drugs.  Review of Systems: 14 system review of systems is positive for speech difficulty, difficulty understanding, difficulty hearing, confusion and rest  pertinent responses as per HPI.  Allergies:  Allergies  Allergen Reactions  . Nsaids Nausea And Vomiting  Medications: I have reviewed the patient's current medications.  Test Results: CBC:  Recent Labs  Lab 06/04/19 1625 06/10/19 1441  WBC 9.0 8.2  NEUTROABS  --  5.9  HGB 12.3* 10.2*  HCT 38.6* 32.9*  MCV 90.4 92.4  PLT 310 623   Basic Metabolic Panel:  Recent Labs  Lab 06/04/19 1625 06/10/19 1441  NA 138 132*  K 3.7 3.7  CL 103 100  CO2 25 22  GLUCOSE 201* 215*  BUN 31* 36*  CREATININE 1.68* 1.98*  CALCIUM 10.2 8.9   Liver Function Tests: Recent Labs  Lab 06/04/19 1625  AST 18  ALT 13  ALKPHOS 55  BILITOT 0.6  PROT 8.1  ALBUMIN 4.2   Recent Labs  Lab  06/04/19 1625  LIPASE 20   No results for input(s): AMMONIA in the last 168 hours. Coagulation Studies:  Recent Labs    06/10/19 1441  LABPROT 13.8  INR 1.1   Cardiac Enzymes:  Recent Labs  Lab 06/04/19 1919  CKTOTAL 67   BNP: Invalid input(s): POCBNP CBG:  Recent Labs  Lab 06/04/19 1611 06/10/19 1435 06/10/19 2212 06/11/19 0654 06/11/19 1159  GLUCAP 192* 209* 81 92 77   Urinalysis:  Recent Labs  Lab 06/04/19 2330 06/10/19 1353  COLORURINE YELLOW YELLOW  LABSPEC 1.030 1.015  PHURINE 5.0 5.0  GLUCOSEU NEGATIVE NEGATIVE  HGBUR NEGATIVE NEGATIVE  BILIRUBINUR NEGATIVE NEGATIVE  KETONESUR NEGATIVE NEGATIVE  PROTEINUR 30* NEGATIVE  NITRITE NEGATIVE NEGATIVE  LEUKOCYTESUR NEGATIVE NEGATIVE   Microbiology:  Results for orders placed or performed during the hospital encounter of 06/10/19  SARS Coronavirus 2 [facility] order, Performed in [facility] hospital lab) Nasopharyngeal Nasopharyngeal Swab     Status: None   Collection Time: 06/10/19  6:29 PM   Specimen: Nasopharyngeal Swab  Result Value Ref Range Status   SARS Coronavirus 2 NEGATIVE NEGATIVE Final    Comment: (NOTE) If result is NEGATIVE SARS-CoV-2 target nucleic acids are NOT DETECTED. The SARS-CoV-2 RNA is generally detectable in upper and lower  respiratory specimens during the acute phase of infection. The lowest  concentration of SARS-CoV-2 viral copies this assay can detect is 250  copies / mL. A negative result does not preclude SARS-CoV-2 infection  and should not be used as the sole basis for treatment or other  patient management decisions.  A negative result may occur with  improper specimen collection / handling, submission of specimen other  than nasopharyngeal swab, presence of viral mutation(s) within the  areas targeted by this assay, and inadequate number of viral copies  (<250 copies / mL). A negative result must be combined with clinical  observations, patient history, and  epidemiological information. If result is POSITIVE SARS-CoV-2 target nucleic acids are DETECTED. The SARS-CoV-2 RNA is generally detectable in upper and lower  respiratory specimens dur ing the acute phase of infection.  Positive  results are indicative of active infection with SARS-CoV-2.  Clinical  correlation with patient history and other diagnostic information is  necessary to determine patient infection status.  Positive results do  not rule out bacterial infection or co-infection with other viruses. If result is PRESUMPTIVE POSTIVE SARS-CoV-2 nucleic acids MAY BE PRESENT.   A presumptive positive result was obtained on the submitted specimen  and confirmed on repeat testing.  While 2019 novel coronavirus  (SARS-CoV-2) nucleic acids may be present in the submitted sample  additional confirmatory testing may be necessary for epidemiological  and / or clinical management purposes  to differentiate between  SARS-CoV-2 and other Sarbecovirus currently known to infect humans.  If clinically indicated additional testing with an alternate test  methodology [phone]) is advised. The SARS-CoV-2 RNA is generally  detectable in upper and lower respiratory sp ecimens during the acute  phase of infection. The expected result is Negative. Fact Sheet for Patients:  StrictlyIdeas.no Fact Sheet for Healthcare Providers: BankingDealers.co.za This test is not yet approved or cleared by the [place] FDA and has been authorized for detection and/or diagnosis of SARS-CoV-2 by FDA under an Emergency Use Authorization (EUA).  This EUA will remain in effect (meaning this test can be used) for the duration of the COVID-19 declaration under Section 564(b)(1) of the Act, 21 U.S.C. section 360bbb-3(b)(1), unless the authorization is terminated or revoked sooner. Performed at [facility], [address], [place], [place] [address]    Lipid Panel:      Component Value Date/Time   CHOL 123 06/11/2019 0851   TRIG 110 06/11/2019 0851   HDL 35 (L) 06/11/2019 0851   CHOLHDL 3.5 06/11/2019 0851   VLDL 22 06/11/2019 0851   LDLCALC 66 06/11/2019 0851   HgbA1c:  Lab Results  Component Value Date   HGBA1C 5.8 (H) 06/11/2019   Urine Drug Screen:     Component Value Date/Time   LABOPIA NONE DETECTED 06/10/2019 1353   COCAINSCRNUR NONE DETECTED 06/10/2019 1353   LABBENZ POSITIVE (A) 06/10/2019 1353   AMPHETMU NONE DETECTED 06/10/2019 1353   THCU NONE DETECTED 06/10/2019 1353   LABBARB NONE DETECTED 06/10/2019 1353    Alcohol Level:  Recent Labs  Lab 06/10/19 1441  ETH <10    Ct Angio Head W Or Wo Contrast  Result Date: 06/10/2019 CLINICAL DATA:  Acute altered mental status/confusion. Speech disturbance. EXAM: CT ANGIOGRAPHY HEAD AND NECK CT PERFUSION BRAIN TECHNIQUE: Multidetector CT imaging of the head and neck was performed using the standard protocol during bolus administration of intravenous contrast. Multiplanar CT image reconstructions and MIPs were obtained to evaluate the vascular anatomy. Carotid stenosis measurements (when applicable) are obtained utilizing NASCET criteria, using the distal internal carotid diameter as the denominator. Multiphase CT imaging of the brain was performed following IV bolus contrast injection. Subsequent parametric perfusion maps were calculated using RAPID software. CONTRAST:  135mL OMNIPAQUE IOHEXOL 350 MG/ML SOLN COMPARISON:  None. FINDINGS: CTA NECK FINDINGS Aortic arch: Standard 3 vessel aortic arch with moderate atherosclerotic plaque. Calcified plaque at both subclavian artery origins does not result in significant stenosis. Right carotid system: Patent with predominantly calcified plaque about the carotid bifurcation resulting in approximately 80% distal common carotid artery stenosis an approximately 60% proximal ICA stenosis. Retropharyngeal course of the proximal ICA. Moderate ECA origin  stenosis. Left carotid system: Patent with predominantly calcified plaque about the carotid bifurcation resulting in 80% proximal ICA stenosis. Partially retropharyngeal course of the proximal ICA. Vertebral arteries: The left vertebral artery is patent with a moderate to severe origin stenosis due to calcified plaque. The right vertebral artery is occluded at its origin with reconstitution of only a small portion of the V2 segment. Skeleton: No fracture or focal osseous lesion. Moderate to severe multilevel cervical disc and facet degeneration. Interbody and left-sided facet ankylosis at C4-5. Other neck: No evidence of cervical lymphadenopathy or mass. Upper chest: Mild apical lung scarring. Trace right pleural effusion. Review of the MIP images confirms the above findings CTA HEAD FINDINGS Anterior circulation: The internal carotid arteries are patent from skull base to carotid termini with relatively diffuse calcified plaque resulting in  moderate right and mild left proximal supraclinoid stenoses. ACAs and MCAs are patent without evidence of proximal branch occlusion or significant proximal stenosis. Posterior circulation: The intracranial left vertebral artery is widely patent and supplies the basilar. Opacification of a small right V4 segment is likely retrograde. Patent left PICA, right AICA, and bilateral SCA origins are identified. The basilar artery is widely patent. There is a fetal origin of the left PCA. Moderate proximal right P2, severe distal left P2, and severe left greater than right P3 stenoses are present. No aneurysm is identified. Venous sinuses: Patent. Anatomic variants: Fetal left PCA. Review of the MIP images confirms the above findings CT Brain Perfusion Findings: ASPECTS: 10 CBF (<30%) Volume: 32mL Perfusion (Tmax>6.0s) volume: 85mL Mismatch Volume: 13mL Infarction Location: None evident by CTP IMPRESSION: 1. Occluded right vertebral artery. 2. Patent left vertebral artery with moderate to  severe stenosis of its origin. 3. 80% distal right common carotid artery stenosis and 60% proximal right ICA stenosis. 4. 80% proximal left ICA stenosis. 5. Moderate right and mild left intracranial ICA stenoses. 6. Negative CTP. 7.  Aortic Atherosclerosis (ICD10-I70.0). These results were called by telephone at the time of interpretation on 06/10/2019 at 2:47 pm to Dr. Veryl Speak , who verbally acknowledged these results. Electronically Signed   By: Logan Bores M.D.   On: 06/10/2019 15:18   Dg Elbow Complete Left  Result Date: 06/04/2019 CLINICAL DATA:  83 year old male with fall. EXAM: LEFT ELBOW - COMPLETE 3+ VIEW COMPARISON:  None. FINDINGS: Evaluation is limited due to suboptimal patient positioning and absence of a true 90 degree lateral view. No definite acute fracture identified on provided images. The bones are osteopenic. There is no dislocation. No significant joint effusion. There is fatty atrophy of musculature. Vascular calcifications noted. The soft tissues are otherwise unremarkable. IMPRESSION: 1. No definite acute fracture or dislocation. No significant joint effusion. 2. Osteopenia. Electronically Signed   By: Anner Crete M.D.   On: 06/04/2019 21:34   Ct Head Wo Contrast  Result Date: 06/10/2019 CLINICAL DATA:  Acute altered mental status/confusion. Improving speech abnormality. EXAM: CT HEAD WITHOUT CONTRAST TECHNIQUE: Contiguous axial images were obtained from the base of the skull through the vertex without intravenous contrast. COMPARISON:  06/04/2019 FINDINGS: Brain: There is no evidence of acute infarct, intracranial hemorrhage, mass, midline shift, or extra-axial fluid collection. 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 Small chronic cortical/subcortical infarcts are also again seen in the right parietal and left occipital lobes. Patchy hypodensities elsewhere in the cerebral white matter bilaterally are unchanged and  nonspecific but compatible with mild-to-moderate chronic small vessel ischemic disease. There is mild cerebral atrophy. Vascular: Calcified atherosclerosis at the skull base. No hyperdense vessel. Skull: No fracture or focal osseous lesion. Sinuses/Orbits: Mild right sphenoid sinus mucosal thickening. Clear mastoid air cells. Bilateral cataract extraction. Other: None. IMPRESSION: 1. No evidence of acute intracranial abnormality.  ASPECTS of 10. 2. Chronic small vessel ischemia with small chronic infarcts as above. These results were called by telephone at the time of interpretation on 06/10/2019 at 2:16 pm to Dr. Veryl Speak , who verbally acknowledged these results. Electronically Signed   By: Logan Bores M.D.   On: 06/10/2019 14:18   Ct Head Wo Contrast  Result Date: 06/04/2019 CLINICAL DATA:  [place] today. EXAM: CT HEAD WITHOUT CONTRAST CT CERVICAL SPINE WITHOUT CONTRAST TECHNIQUE: Multidetector CT imaging of the head and cervical spine was performed following the standard protocol without  intravenous contrast. Multiplanar CT image reconstructions of the cervical spine were also generated. COMPARISON:  None. FINDINGS: CT HEAD FINDINGS Brain: Moderate atrophy. Chronic lacunar infarction right basal ganglia. Patchy hypodensity in the cerebral white matter bilaterally asymmetric in the right parietal lobe most consistent with chronic ischemia. Negative for acute infarct, hemorrhage, or mass. Vascular: Negative for hyperdense vessel Skull: Negative Sinuses/Orbits: Mild mucosal edema paranasal sinuses. Bilateral cataract surgery Other: None CT CERVICAL SPINE FINDINGS Alignment: Normal alignment.  Straightening of the cervical lordosis Skull base and vertebrae: Negative for fracture Soft tissues and spinal canal: Negative for soft tissue mass. Extensive atherosclerotic calcification in the carotid arteries bilaterally. Disc levels: Multilevel disc degeneration and spondylosis. Mild facet degeneration. Multilevel  spinal and foraminal stenosis. No areas of critical spinal stenosis. Upper chest: Lung apices clear bilaterally. Other: None IMPRESSION: Atrophy and chronic ischemic change. No acute intracranial abnormality Negative for cervical spine fracture.  Cervical spondylosis. Carotid artery disease. Electronically Signed   By: Franchot Gallo M.D.   On: 06/04/2019 19:41   Ct Angio Neck W And/or Wo Contrast  Result Date: 06/10/2019 CLINICAL DATA:  Acute altered mental status/confusion. Speech disturbance. EXAM: CT ANGIOGRAPHY HEAD AND NECK CT PERFUSION BRAIN TECHNIQUE: Multidetector CT imaging of the head and neck was performed using the standard protocol during bolus administration of intravenous contrast. Multiplanar CT image reconstructions and MIPs were obtained to evaluate the vascular anatomy. Carotid stenosis measurements (when applicable) are obtained utilizing NASCET criteria, using the distal internal carotid diameter as the denominator. Multiphase CT imaging of the brain was performed following IV bolus contrast injection. Subsequent parametric perfusion maps were calculated using RAPID software. CONTRAST:  156mL OMNIPAQUE IOHEXOL 350 MG/ML SOLN COMPARISON:  None. FINDINGS: CTA NECK FINDINGS Aortic arch: Standard 3 vessel aortic arch with moderate atherosclerotic plaque. Calcified plaque at both subclavian artery origins does not result in significant stenosis. Right carotid system: Patent with predominantly calcified plaque about the carotid bifurcation resulting in approximately 80% distal common carotid artery stenosis an approximately 60% proximal ICA stenosis. Retropharyngeal course of the proximal ICA. Moderate ECA origin stenosis. Left carotid system: Patent with predominantly calcified plaque about the carotid bifurcation resulting in 80% proximal ICA stenosis. Partially retropharyngeal course of the proximal ICA. Vertebral arteries: The left vertebral artery is patent with a moderate to severe origin  stenosis due to calcified plaque. The right vertebral artery is occluded at its origin with reconstitution of only a small portion of the V2 segment. Skeleton: No fracture or focal osseous lesion. Moderate to severe multilevel cervical disc and facet degeneration. Interbody and left-sided facet ankylosis at C4-5. Other neck: No evidence of cervical lymphadenopathy or mass. Upper chest: Mild apical lung scarring. Trace right pleural effusion. Review of the MIP images confirms the above findings CTA HEAD FINDINGS Anterior circulation: The internal carotid arteries are patent from skull base to carotid termini with relatively diffuse calcified plaque resulting in moderate right and mild left proximal supraclinoid stenoses. ACAs and MCAs are patent without evidence of proximal branch occlusion or significant proximal stenosis. Posterior circulation: The intracranial left vertebral artery is widely patent and supplies the basilar. Opacification of a small right V4 segment is likely retrograde. Patent left PICA, right AICA, and bilateral SCA origins are identified. The basilar artery is widely patent. There is a fetal origin of the left PCA. Moderate proximal right P2, severe distal left P2, and severe left greater than right P3 stenoses are present. No aneurysm is identified. Venous sinuses: Patent. Anatomic variants: Fetal left  PCA. Review of the MIP images confirms the above findings CT Brain Perfusion Findings: ASPECTS: 10 CBF (<30%) Volume: 3mL Perfusion (Tmax>6.0s) volume: 70mL Mismatch Volume: 35mL Infarction Location: None evident by CTP IMPRESSION: 1. Occluded right vertebral artery. 2. Patent left vertebral artery with moderate to severe stenosis of its origin. 3. 80% distal right common carotid artery stenosis and 60% proximal right ICA stenosis. 4. 80% proximal left ICA stenosis. 5. Moderate right and mild left intracranial ICA stenoses. 6. Negative CTP. 7.  Aortic Atherosclerosis (ICD10-I70.0). These results  were called by telephone at the time of interpretation on 06/10/2019 at 2:47 pm to Dr. Veryl Speak , who verbally acknowledged these results. Electronically Signed   By: Logan Bores M.D.   On: 06/10/2019 15:18   Ct Cervical Spine Wo Contrast  Result Date: 06/04/2019 CLINICAL DATA:  [place] today. EXAM: CT HEAD WITHOUT CONTRAST CT CERVICAL SPINE WITHOUT CONTRAST TECHNIQUE: Multidetector CT imaging of the head and cervical spine was performed following the standard protocol without intravenous contrast. Multiplanar CT image reconstructions of the cervical spine were also generated. COMPARISON:  None. FINDINGS: CT HEAD FINDINGS Brain: Moderate atrophy. Chronic lacunar infarction right basal ganglia. Patchy hypodensity in the cerebral white matter bilaterally asymmetric in the right parietal lobe most consistent with chronic ischemia. Negative for acute infarct, hemorrhage, or mass. Vascular: Negative for hyperdense vessel Skull: Negative Sinuses/Orbits: Mild mucosal edema paranasal sinuses. Bilateral cataract surgery Other: None CT CERVICAL SPINE FINDINGS Alignment: Normal alignment.  Straightening of the cervical lordosis Skull base and vertebrae: Negative for fracture Soft tissues and spinal canal: Negative for soft tissue mass. Extensive atherosclerotic calcification in the carotid arteries bilaterally. Disc levels: Multilevel disc degeneration and spondylosis. Mild facet degeneration. Multilevel spinal and foraminal stenosis. No areas of critical spinal stenosis. Upper chest: Lung apices clear bilaterally. Other: None IMPRESSION: Atrophy and chronic ischemic change. No acute intracranial abnormality Negative for cervical spine fracture.  Cervical spondylosis. Carotid artery disease. Electronically Signed   By: Franchot Gallo M.D.   On: 06/04/2019 19:41   Mr Brain Wo Contrast  Result Date: 06/11/2019 CLINICAL DATA:  Acute onset garbled speech EXAM: MRI HEAD WITHOUT CONTRAST TECHNIQUE: Multiplanar, multiecho  pulse sequences of the brain and surrounding structures were obtained without intravenous contrast. COMPARISON:  Head CT, CTA, and CT perfusion from yesterday FINDINGS: Brain: Small areas of restricted diffusion along the medial right thalamus and right corona radiata. Remote lacunar infarct with mineralization at the right basal ganglia. Small remote left occipital cortex infarct. Moderate chronic small vessel ischemic type gliosis in the cerebral white matter. Mild-to-moderate generalized volume loss. No acute hemorrhage, hydrocephalus, or masslike finding. Vascular: Absent right vertebral flow void which correlates with CTA. Other major flow voids are preserved. Skull and upper cervical spine: Negative for marrow lesion Sinuses/Orbits: No emergent finding. There is minimal left mastoid opacification with negative nasopharynx. Bilateral cataract resection. IMPRESSION: 1. Small acute infarcts at the medial right thalamus and right internal capsule. 2. Chronic ischemic injury including remote right basal ganglia infarct and left occipital cortex infarct. Electronically Signed   By: Monte Fantasia M.D.   On: 06/11/2019 04:35   Ct Abdomen Pelvis W Contrast  Result Date: 06/04/2019 CLINICAL DATA:  83 year old male with acute generalized abdominal pain. EXAM: CT ABDOMEN AND PELVIS WITH CONTRAST TECHNIQUE: Multidetector CT imaging of the abdomen and pelvis was performed using the standard protocol following bolus administration of intravenous contrast. CONTRAST:  6mL OMNIPAQUE IOHEXOL 300 MG/ML  SOLN COMPARISON:  Renal ultrasound dated 06/12/2015  FINDINGS: Lower chest: Partially visualized small right pleural effusion. Subsegmental bibasilar atelectasis versus infiltrate. Faint nodular density at the right lung base may represent developing infiltrate. Clinical correlation is recommended. There is multi vessel coronary vascular calcification and calcification of the visualized thoracic aorta. No intra-abdominal  free air or free fluid. Hepatobiliary: The liver is unremarkable. No intrahepatic biliary ductal dilatation. Multiple stones noted in the proximal gallbladder. No pericholecystic fluid or evidence of acute cholecystitis by CT. Pancreas: Unremarkable. No pancreatic ductal dilatation or surrounding inflammatory changes. Spleen: Normal in size without focal abnormality. Adrenals/Urinary Tract: Small calcific foci in the left adrenal gland, likely sequela of prior insult. There is a 3 cm fat containing right adrenal nodule most consistent with an adenoma versus a myelolipoma. Several small calcification within this nodule likely sequela of prior insult. There are multiple bilateral renal cysts measure up to 3.5 cm in the superior pole of the right kidney. Additional smaller hypodense lesions are not well characterized. There is no hydronephrosis on either side. There is symmetric enhancement and excretion of contrast by both kidneys. The visualized ureters and urinary bladder appear unremarkable. Stomach/Bowel: There is diffuse thickening of distal esophagus with inflammatory changes which may represent esophagitis. An infiltrative esophageal neoplasm is not entirely excluded but favored less likely. Clinical correlation and follow-up recommended. There is sigmoid diverticulosis without active inflammatory changes. Moderate stool noted throughout the colon. There is no bowel obstruction. The appendix is normal. Vascular/Lymphatic: Moderate aortoiliac atherosclerotic disease. The IVC is unremarkable. No portal venous gas. No adenopathy. Top-normal passive visual lymph node measures approximately 7 mm in short axis. Reproductive: The prostate and seminal vesicles are grossly unremarkable. Other: None Musculoskeletal: Degenerative changes of the spine. Multilevel disc desiccation and vacuum phenomena. No acute osseous pathology. IMPRESSION: 1. Findings most consistent with esophagitis. An esophageal neoplasm is favored  less likely but not excluded. Clinical correlation and follow-up recommended. 2. Cholelithiasis. 3. Sigmoid diverticulosis. No bowel obstruction or active inflammation. Normal appendix. Aortic Atherosclerosis (ICD10-I70.0). Electronically Signed   By: Anner Crete M.D.   On: 06/04/2019 19:50   Ct Cerebral Perfusion W Contrast  Result Date: 06/10/2019 CLINICAL DATA:  Acute altered mental status/confusion. Speech disturbance. EXAM: CT ANGIOGRAPHY HEAD AND NECK CT PERFUSION BRAIN TECHNIQUE: Multidetector CT imaging of the head and neck was performed using the standard protocol during bolus administration of intravenous contrast. Multiplanar CT image reconstructions and MIPs were obtained to evaluate the vascular anatomy. Carotid stenosis measurements (when applicable) are obtained utilizing NASCET criteria, using the distal internal carotid diameter as the denominator. Multiphase CT imaging of the brain was performed following IV bolus contrast injection. Subsequent parametric perfusion maps were calculated using RAPID software. CONTRAST:  132mL OMNIPAQUE IOHEXOL 350 MG/ML SOLN COMPARISON:  None. FINDINGS: CTA NECK FINDINGS Aortic arch: Standard 3 vessel aortic arch with moderate atherosclerotic plaque. Calcified plaque at both subclavian artery origins does not result in significant stenosis. Right carotid system: Patent with predominantly calcified plaque about the carotid bifurcation resulting in approximately 80% distal common carotid artery stenosis an approximately 60% proximal ICA stenosis. Retropharyngeal course of the proximal ICA. Moderate ECA origin stenosis. Left carotid system: Patent with predominantly calcified plaque about the carotid bifurcation resulting in 80% proximal ICA stenosis. Partially retropharyngeal course of the proximal ICA. Vertebral arteries: The left vertebral artery is patent with a moderate to severe origin stenosis due to calcified plaque. The right vertebral artery is  occluded at its origin with reconstitution of only a small portion of the V2 segment.  Skeleton: No fracture or focal osseous lesion. Moderate to severe multilevel cervical disc and facet degeneration. Interbody and left-sided facet ankylosis at C4-5. Other neck: No evidence of cervical lymphadenopathy or mass. Upper chest: Mild apical lung scarring. Trace right pleural effusion. Review of the MIP images confirms the above findings CTA HEAD FINDINGS Anterior circulation: The internal carotid arteries are patent from skull base to carotid termini with relatively diffuse calcified plaque resulting in moderate right and mild left proximal supraclinoid stenoses. ACAs and MCAs are patent without evidence of proximal branch occlusion or significant proximal stenosis. Posterior circulation: The intracranial left vertebral artery is widely patent and supplies the basilar. Opacification of a small right V4 segment is likely retrograde. Patent left PICA, right AICA, and bilateral SCA origins are identified. The basilar artery is widely patent. There is a fetal origin of the left PCA. Moderate proximal right P2, severe distal left P2, and severe left greater than right P3 stenoses are present. No aneurysm is identified. Venous sinuses: Patent. Anatomic variants: Fetal left PCA. Review of the MIP images confirms the above findings CT Brain Perfusion Findings: ASPECTS: 10 CBF (<30%) Volume: 52mL Perfusion (Tmax>6.0s) volume: 79mL Mismatch Volume: 72mL Infarction Location: None evident by CTP IMPRESSION: 1. Occluded right vertebral artery. 2. Patent left vertebral artery with moderate to severe stenosis of its origin. 3. 80% distal right common carotid artery stenosis and 60% proximal right ICA stenosis. 4. 80% proximal left ICA stenosis. 5. Moderate right and mild left intracranial ICA stenoses. 6. Negative CTP. 7.  Aortic Atherosclerosis (ICD10-I70.0). These results were called by telephone at the time of interpretation on  06/10/2019 at 2:47 pm to Dr. Veryl Speak , who verbally acknowledged these results. Electronically Signed   By: Logan Bores M.D.   On: 06/10/2019 15:18   Dg Chest Portable 1 View  Result Date: 06/04/2019 CLINICAL DATA:  83 year old male with fall. EXAM: PORTABLE CHEST 1 VIEW COMPARISON:  Chest CT dated 06/04/2019 FINDINGS: There is mild cardiomegaly with mild vascular congestion. Small bilateral pleural effusions may be present. No focal consolidation or pneumothorax. Median sternotomy wires. No acute osseous pathology. IMPRESSION: Mild cardiomegaly with mild vascular congestion and small pleural effusion. Electronically Signed   By: Anner Crete M.D.   On: 06/04/2019 21:31     EKG: normal EKG, normal sinus rhythm, unchanged from previous tracings, LBBB, wide QRS. No change since 06/10/2019.   Physical Examination: Temp:  [97.5 F (36.4 C)-98.7 F (37.1 C)] 97.6 F (36.4 C) (09/29 1203) Pulse Rate:  [57-84] 61 (09/29 1203) Resp:  [13-27] 15 (09/29 1203) BP: (119-173)/(52-81) 172/64 (09/29 1203) SpO2:  [96 %-99 %] 97 % (09/29 1203) Weight:  [84.6 kg] 84.6 kg (09/28 2109) Pleasant elderly Caucasian male not in distress.  He is extremely hard of hearing. . Afebrile. Head is nontraumatic. Neck is supple without bruit.    Cardiac exam no murmur or gallop. Lungs are clear to auscultation. Distal pulses are well felt.  He has phocomelic deformity in the left upper extremity which is smaller in length and there is flexion deformity at the left wrist and fingers with hypotonia. Neurological Exam :  Awake alert oriented to place and person.  He has significant decreased hearing and has trouble understanding and communicating but follows simple commands well.  Speech has slight dysarthria but appears to be easy to understand.  There is no aphasia.  Extraocular movements are full range without nystagmus.  Face is symmetric without weakness.  Tongue is midline.  Motor  system exam shows mild left upper  extremity drift and 3/5 weakness with distal greater than proximal weakness which is old from both deformity.  Lower extremity exam shows mild drift bilaterally though patient's cooperation is limited due to difficulty understanding the command.  Tone is and is equal in both lower extremities.  Sensation appears preserved bilaterally.  Both plantars are downgoing.  Gait not tested. Data Reviewed: Ct Angio Head W Or Wo Contrast  Result Date: 06/10/2019 CLINICAL DATA:  Acute altered mental status/confusion. Speech disturbance. EXAM: CT ANGIOGRAPHY HEAD AND NECK CT PERFUSION BRAIN TECHNIQUE: Multidetector CT imaging of the head and neck was performed using the standard protocol during bolus administration of intravenous contrast. Multiplanar CT image reconstructions and MIPs were obtained to evaluate the vascular anatomy. Carotid stenosis measurements (when applicable) are obtained utilizing NASCET criteria, using the distal internal carotid diameter as the denominator. Multiphase CT imaging of the brain was performed following IV bolus contrast injection. Subsequent parametric perfusion maps were calculated using RAPID software. CONTRAST:  12mL OMNIPAQUE IOHEXOL 350 MG/ML SOLN COMPARISON:  None. FINDINGS: CTA NECK FINDINGS Aortic arch: Standard 3 vessel aortic arch with moderate atherosclerotic plaque. Calcified plaque at both subclavian artery origins does not result in significant stenosis. Right carotid system: Patent with predominantly calcified plaque about the carotid bifurcation resulting in approximately 80% distal common carotid artery stenosis an approximately 60% proximal ICA stenosis. Retropharyngeal course of the proximal ICA. Moderate ECA origin stenosis. Left carotid system: Patent with predominantly calcified plaque about the carotid bifurcation resulting in 80% proximal ICA stenosis. Partially retropharyngeal course of the proximal ICA. Vertebral arteries: The left vertebral artery is patent with  a moderate to severe origin stenosis due to calcified plaque. The right vertebral artery is occluded at its origin with reconstitution of only a small portion of the V2 segment. Skeleton: No fracture or focal osseous lesion. Moderate to severe multilevel cervical disc and facet degeneration. Interbody and left-sided facet ankylosis at C4-5. Other neck: No evidence of cervical lymphadenopathy or mass. Upper chest: Mild apical lung scarring. Trace right pleural effusion. Review of the MIP images confirms the above findings CTA HEAD FINDINGS Anterior circulation: The internal carotid arteries are patent from skull base to carotid termini with relatively diffuse calcified plaque resulting in moderate right and mild left proximal supraclinoid stenoses. ACAs and MCAs are patent without evidence of proximal branch occlusion or significant proximal stenosis. Posterior circulation: The intracranial left vertebral artery is widely patent and supplies the basilar. Opacification of a small right V4 segment is likely retrograde. Patent left PICA, right AICA, and bilateral SCA origins are identified. The basilar artery is widely patent. There is a fetal origin of the left PCA. Moderate proximal right P2, severe distal left P2, and severe left greater than right P3 stenoses are present. No aneurysm is identified. Venous sinuses: Patent. Anatomic variants: Fetal left PCA. Review of the MIP images confirms the above findings CT Brain Perfusion Findings: ASPECTS: 10 CBF (<30%) Volume: 18mL Perfusion (Tmax>6.0s) volume: 44mL Mismatch Volume: 29mL Infarction Location: None evident by CTP IMPRESSION: 1. Occluded right vertebral artery. 2. Patent left vertebral artery with moderate to severe stenosis of its origin. 3. 80% distal right common carotid artery stenosis and 60% proximal right ICA stenosis. 4. 80% proximal left ICA stenosis. 5. Moderate right and mild left intracranial ICA stenoses. 6. Negative CTP. 7.  Aortic Atherosclerosis  (ICD10-I70.0). These results were called by telephone at the time of interpretation on 06/10/2019 at 2:47 pm to Dr. Nathaneil Canary  [place] , who verbally acknowledged these results. Electronically Signed   By: Logan Bores M.D.   On: 06/10/2019 15:18   Dg Elbow Complete Left  Result Date: 06/04/2019 CLINICAL DATA:  83 year old male with fall. EXAM: LEFT ELBOW - COMPLETE 3+ VIEW COMPARISON:  None. FINDINGS: Evaluation is limited due to suboptimal patient positioning and absence of a true 90 degree lateral view. No definite acute fracture identified on provided images. The bones are osteopenic. There is no dislocation. No significant joint effusion. There is fatty atrophy of musculature. Vascular calcifications noted. The soft tissues are otherwise unremarkable. IMPRESSION: 1. No definite acute fracture or dislocation. No significant joint effusion. 2. Osteopenia. Electronically Signed   By: Anner Crete M.D.   On: 06/04/2019 21:34   Ct Head Wo Contrast  Result Date: 06/10/2019 CLINICAL DATA:  Acute altered mental status/confusion. Improving speech abnormality. EXAM: CT HEAD WITHOUT CONTRAST TECHNIQUE: Contiguous axial images were obtained from the base of the skull through the vertex without intravenous contrast. COMPARISON:  06/04/2019 FINDINGS: Brain: There is no evidence of acute infarct, intracranial hemorrhage, mass, midline shift, or extra-axial fluid collection. 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 Small chronic cortical/subcortical infarcts are also again seen in the right parietal and left occipital lobes. Patchy hypodensities elsewhere in the cerebral white matter bilaterally are unchanged and nonspecific but compatible with mild-to-moderate chronic small vessel ischemic disease. There is mild cerebral atrophy. Vascular: Calcified atherosclerosis at the skull base. No hyperdense vessel. Skull: No fracture or focal osseous lesion. Sinuses/Orbits: Mild  right sphenoid sinus mucosal thickening. Clear mastoid air cells. Bilateral cataract extraction. Other: None. IMPRESSION: 1. No evidence of acute intracranial abnormality.  ASPECTS of 10. 2. Chronic small vessel ischemia with small chronic infarcts as above. These results were called by telephone at the time of interpretation on 06/10/2019 at 2:16 pm to Dr. Veryl Speak , who verbally acknowledged these results. Electronically Signed   By: Logan Bores M.D.   On: 06/10/2019 14:18   Ct Head Wo Contrast  Result Date: 06/04/2019 CLINICAL DATA:  [place] today. EXAM: CT HEAD WITHOUT CONTRAST CT CERVICAL SPINE WITHOUT CONTRAST TECHNIQUE: Multidetector CT imaging of the head and cervical spine was performed following the standard protocol without intravenous contrast. Multiplanar CT image reconstructions of the cervical spine were also generated. COMPARISON:  None. FINDINGS: CT HEAD FINDINGS Brain: Moderate atrophy. Chronic lacunar infarction right basal ganglia. Patchy hypodensity in the cerebral white matter bilaterally asymmetric in the right parietal lobe most consistent with chronic ischemia. Negative for acute infarct, hemorrhage, or mass. Vascular: Negative for hyperdense vessel Skull: Negative Sinuses/Orbits: Mild mucosal edema paranasal sinuses. Bilateral cataract surgery Other: None CT CERVICAL SPINE FINDINGS Alignment: Normal alignment.  Straightening of the cervical lordosis Skull base and vertebrae: Negative for fracture Soft tissues and spinal canal: Negative for soft tissue mass. Extensive atherosclerotic calcification in the carotid arteries bilaterally. Disc levels: Multilevel disc degeneration and spondylosis. Mild facet degeneration. Multilevel spinal and foraminal stenosis. No areas of critical spinal stenosis. Upper chest: Lung apices clear bilaterally. Other: None IMPRESSION: Atrophy and chronic ischemic change. No acute intracranial abnormality Negative for cervical spine fracture.  Cervical  spondylosis. Carotid artery disease. Electronically Signed   By: Franchot Gallo M.D.   On: 06/04/2019 19:41   Ct Angio Neck W And/or Wo Contrast  Result Date: 06/10/2019 CLINICAL DATA:  Acute altered mental status/confusion. Speech disturbance. EXAM: CT ANGIOGRAPHY HEAD AND NECK CT PERFUSION BRAIN TECHNIQUE: Multidetector CT imaging  of the head and neck was performed using the standard protocol during bolus administration of intravenous contrast. Multiplanar CT image reconstructions and MIPs were obtained to evaluate the vascular anatomy. Carotid stenosis measurements (when applicable) are obtained utilizing NASCET criteria, using the distal internal carotid diameter as the denominator. Multiphase CT imaging of the brain was performed following IV bolus contrast injection. Subsequent parametric perfusion maps were calculated using RAPID software. CONTRAST:  154mL OMNIPAQUE IOHEXOL 350 MG/ML SOLN COMPARISON:  None. FINDINGS: CTA NECK FINDINGS Aortic arch: Standard 3 vessel aortic arch with moderate atherosclerotic plaque. Calcified plaque at both subclavian artery origins does not result in significant stenosis. Right carotid system: Patent with predominantly calcified plaque about the carotid bifurcation resulting in approximately 80% distal common carotid artery stenosis an approximately 60% proximal ICA stenosis. Retropharyngeal course of the proximal ICA. Moderate ECA origin stenosis. Left carotid system: Patent with predominantly calcified plaque about the carotid bifurcation resulting in 80% proximal ICA stenosis. Partially retropharyngeal course of the proximal ICA. Vertebral arteries: The left vertebral artery is patent with a moderate to severe origin stenosis due to calcified plaque. The right vertebral artery is occluded at its origin with reconstitution of only a small portion of the V2 segment. Skeleton: No fracture or focal osseous lesion. Moderate to severe multilevel cervical disc and facet  degeneration. Interbody and left-sided facet ankylosis at C4-5. Other neck: No evidence of cervical lymphadenopathy or mass. Upper chest: Mild apical lung scarring. Trace right pleural effusion. Review of the MIP images confirms the above findings CTA HEAD FINDINGS Anterior circulation: The internal carotid arteries are patent from skull base to carotid termini with relatively diffuse calcified plaque resulting in moderate right and mild left proximal supraclinoid stenoses. ACAs and MCAs are patent without evidence of proximal branch occlusion or significant proximal stenosis. Posterior circulation: The intracranial left vertebral artery is widely patent and supplies the basilar. Opacification of a small right V4 segment is likely retrograde. Patent left PICA, right AICA, and bilateral SCA origins are identified. The basilar artery is widely patent. There is a fetal origin of the left PCA. Moderate proximal right P2, severe distal left P2, and severe left greater than right P3 stenoses are present. No aneurysm is identified. Venous sinuses: Patent. Anatomic variants: Fetal left PCA. Review of the MIP images confirms the above findings CT Brain Perfusion Findings: ASPECTS: 10 CBF (<30%) Volume: 22mL Perfusion (Tmax>6.0s) volume: 89mL Mismatch Volume: 73mL Infarction Location: None evident by CTP IMPRESSION: 1. Occluded right vertebral artery. 2. Patent left vertebral artery with moderate to severe stenosis of its origin. 3. 80% distal right common carotid artery stenosis and 60% proximal right ICA stenosis. 4. 80% proximal left ICA stenosis. 5. Moderate right and mild left intracranial ICA stenoses. 6. Negative CTP. 7.  Aortic Atherosclerosis (ICD10-I70.0). These results were called by telephone at the time of interpretation on 06/10/2019 at 2:47 pm to Dr. Veryl Speak , who verbally acknowledged these results. Electronically Signed   By: Logan Bores M.D.   On: 06/10/2019 15:18   Ct Cervical Spine Wo  Contrast  Result Date: 06/04/2019 CLINICAL DATA:  [place] today. EXAM: CT HEAD WITHOUT CONTRAST CT CERVICAL SPINE WITHOUT CONTRAST TECHNIQUE: Multidetector CT imaging of the head and cervical spine was performed following the standard protocol without intravenous contrast. Multiplanar CT image reconstructions of the cervical spine were also generated. COMPARISON:  None. FINDINGS: CT HEAD FINDINGS Brain: Moderate atrophy. Chronic lacunar infarction right basal ganglia. Patchy hypodensity in the cerebral white matter bilaterally asymmetric  in the right parietal lobe most consistent with chronic ischemia. Negative for acute infarct, hemorrhage, or mass. Vascular: Negative for hyperdense vessel Skull: Negative Sinuses/Orbits: Mild mucosal edema paranasal sinuses. Bilateral cataract surgery Other: None CT CERVICAL SPINE FINDINGS Alignment: Normal alignment.  Straightening of the cervical lordosis Skull base and vertebrae: Negative for fracture Soft tissues and spinal canal: Negative for soft tissue mass. Extensive atherosclerotic calcification in the carotid arteries bilaterally. Disc levels: Multilevel disc degeneration and spondylosis. Mild facet degeneration. Multilevel spinal and foraminal stenosis. No areas of critical spinal stenosis. Upper chest: Lung apices clear bilaterally. Other: None IMPRESSION: Atrophy and chronic ischemic change. No acute intracranial abnormality Negative for cervical spine fracture.  Cervical spondylosis. Carotid artery disease. Electronically Signed   By: Franchot Gallo M.D.   On: 06/04/2019 19:41   Mr Brain Wo Contrast  Result Date: 06/11/2019 CLINICAL DATA:  Acute onset garbled speech EXAM: MRI HEAD WITHOUT CONTRAST TECHNIQUE: Multiplanar, multiecho pulse sequences of the brain and surrounding structures were obtained without intravenous contrast. COMPARISON:  Head CT, CTA, and CT perfusion from yesterday FINDINGS: Brain: Small areas of restricted diffusion along the medial right  thalamus and right corona radiata. Remote lacunar infarct with mineralization at the right basal ganglia. Small remote left occipital cortex infarct. Moderate chronic small vessel ischemic type gliosis in the cerebral white matter. Mild-to-moderate generalized volume loss. No acute hemorrhage, hydrocephalus, or masslike finding. Vascular: Absent right vertebral flow void which correlates with CTA. Other major flow voids are preserved. Skull and upper cervical spine: Negative for marrow lesion Sinuses/Orbits: No emergent finding. There is minimal left mastoid opacification with negative nasopharynx. Bilateral cataract resection. IMPRESSION: 1. Small acute infarcts at the medial right thalamus and right internal capsule. 2. Chronic ischemic injury including remote right basal ganglia infarct and left occipital cortex infarct. Electronically Signed   By: Monte Fantasia M.D.   On: 06/11/2019 04:35   Ct Abdomen Pelvis W Contrast  Result Date: 06/04/2019 CLINICAL DATA:  83 year old male with acute generalized abdominal pain. EXAM: CT ABDOMEN AND PELVIS WITH CONTRAST TECHNIQUE: Multidetector CT imaging of the abdomen and pelvis was performed using the standard protocol following bolus administration of intravenous contrast. CONTRAST:  60mL OMNIPAQUE IOHEXOL 300 MG/ML  SOLN COMPARISON:  Renal ultrasound dated 06/12/2015 FINDINGS: Lower chest: Partially visualized small right pleural effusion. Subsegmental bibasilar atelectasis versus infiltrate. Faint nodular density at the right lung base may represent developing infiltrate. Clinical correlation is recommended. There is multi vessel coronary vascular calcification and calcification of the visualized thoracic aorta. No intra-abdominal free air or free fluid. Hepatobiliary: The liver is unremarkable. No intrahepatic biliary ductal dilatation. Multiple stones noted in the proximal gallbladder. No pericholecystic fluid or evidence of acute cholecystitis by CT. Pancreas:  Unremarkable. No pancreatic ductal dilatation or surrounding inflammatory changes. Spleen: Normal in size without focal abnormality. Adrenals/Urinary Tract: Small calcific foci in the left adrenal gland, likely sequela of prior insult. There is a 3 cm fat containing right adrenal nodule most consistent with an adenoma versus a myelolipoma. Several small calcification within this nodule likely sequela of prior insult. There are multiple bilateral renal cysts measure up to 3.5 cm in the superior pole of the right kidney. Additional smaller hypodense lesions are not well characterized. There is no hydronephrosis on either side. There is symmetric enhancement and excretion of contrast by both kidneys. The visualized ureters and urinary bladder appear unremarkable. Stomach/Bowel: There is diffuse thickening of distal esophagus with inflammatory changes which may represent esophagitis. An infiltrative esophageal  neoplasm is not entirely excluded but favored less likely. Clinical correlation and follow-up recommended. There is sigmoid diverticulosis without active inflammatory changes. Moderate stool noted throughout the colon. There is no bowel obstruction. The appendix is normal. Vascular/Lymphatic: Moderate aortoiliac atherosclerotic disease. The IVC is unremarkable. No portal venous gas. No adenopathy. Top-normal passive visual lymph node measures approximately 7 mm in short axis. Reproductive: The prostate and seminal vesicles are grossly unremarkable. Other: None Musculoskeletal: Degenerative changes of the spine. Multilevel disc desiccation and vacuum phenomena. No acute osseous pathology. IMPRESSION: 1. Findings most consistent with esophagitis. An esophageal neoplasm is favored less likely but not excluded. Clinical correlation and follow-up recommended. 2. Cholelithiasis. 3. Sigmoid diverticulosis. No bowel obstruction or active inflammation. Normal appendix. Aortic Atherosclerosis (ICD10-I70.0). Electronically  Signed   By: Anner Crete M.D.   On: 06/04/2019 19:50   Ct Cerebral Perfusion W Contrast  Result Date: 06/10/2019 CLINICAL DATA:  Acute altered mental status/confusion. Speech disturbance. EXAM: CT ANGIOGRAPHY HEAD AND NECK CT PERFUSION BRAIN TECHNIQUE: Multidetector CT imaging of the head and neck was performed using the standard protocol during bolus administration of intravenous contrast. Multiplanar CT image reconstructions and MIPs were obtained to evaluate the vascular anatomy. Carotid stenosis measurements (when applicable) are obtained utilizing NASCET criteria, using the distal internal carotid diameter as the denominator. Multiphase CT imaging of the brain was performed following IV bolus contrast injection. Subsequent parametric perfusion maps were calculated using RAPID software. CONTRAST:  136mL OMNIPAQUE IOHEXOL 350 MG/ML SOLN COMPARISON:  None. FINDINGS: CTA NECK FINDINGS Aortic arch: Standard 3 vessel aortic arch with moderate atherosclerotic plaque. Calcified plaque at both subclavian artery origins does not result in significant stenosis. Right carotid system: Patent with predominantly calcified plaque about the carotid bifurcation resulting in approximately 80% distal common carotid artery stenosis an approximately 60% proximal ICA stenosis. Retropharyngeal course of the proximal ICA. Moderate ECA origin stenosis. Left carotid system: Patent with predominantly calcified plaque about the carotid bifurcation resulting in 80% proximal ICA stenosis. Partially retropharyngeal course of the proximal ICA. Vertebral arteries: The left vertebral artery is patent with a moderate to severe origin stenosis due to calcified plaque. The right vertebral artery is occluded at its origin with reconstitution of only a small portion of the V2 segment. Skeleton: No fracture or focal osseous lesion. Moderate to severe multilevel cervical disc and facet degeneration. Interbody and left-sided facet ankylosis at  C4-5. Other neck: No evidence of cervical lymphadenopathy or mass. Upper chest: Mild apical lung scarring. Trace right pleural effusion. Review of the MIP images confirms the above findings CTA HEAD FINDINGS Anterior circulation: The internal carotid arteries are patent from skull base to carotid termini with relatively diffuse calcified plaque resulting in moderate right and mild left proximal supraclinoid stenoses. ACAs and MCAs are patent without evidence of proximal branch occlusion or significant proximal stenosis. Posterior circulation: The intracranial left vertebral artery is widely patent and supplies the basilar. Opacification of a small right V4 segment is likely retrograde. Patent left PICA, right AICA, and bilateral SCA origins are identified. The basilar artery is widely patent. There is a fetal origin of the left PCA. Moderate proximal right P2, severe distal left P2, and severe left greater than right P3 stenoses are present. No aneurysm is identified. Venous sinuses: Patent. Anatomic variants: Fetal left PCA. Review of the MIP images confirms the above findings CT Brain Perfusion Findings: ASPECTS: 10 CBF (<30%) Volume: 58mL Perfusion (Tmax>6.0s) volume: 44mL Mismatch Volume: 37mL Infarction Location: None evident by CTP IMPRESSION:  1. Occluded right vertebral artery. 2. Patent left vertebral artery with moderate to severe stenosis of its origin. 3. 80% distal right common carotid artery stenosis and 60% proximal right ICA stenosis. 4. 80% proximal left ICA stenosis. 5. Moderate right and mild left intracranial ICA stenoses. 6. Negative CTP. 7.  Aortic Atherosclerosis (ICD10-I70.0). These results were called by telephone at the time of interpretation on 06/10/2019 at 2:47 pm to Dr. Veryl Speak , who verbally acknowledged these results. Electronically Signed   By: Logan Bores M.D.   On: 06/10/2019 15:18   Dg Chest Portable 1 View  Result Date: 06/04/2019 CLINICAL DATA:  83 year old male with fall.  EXAM: PORTABLE CHEST 1 VIEW COMPARISON:  Chest CT dated 06/04/2019 FINDINGS: There is mild cardiomegaly with mild vascular congestion. Small bilateral pleural effusions may be present. No focal consolidation or pneumothorax. Median sternotomy wires. No acute osseous pathology. IMPRESSION: Mild cardiomegaly with mild vascular congestion and small pleural effusion. Electronically Signed   By: Anner Crete M.D.   On: 06/04/2019 21:31    2D Echocardiogram  1. Left ventricular ejection fraction, by visual estimation, is 60 to 65%. The left ventricle has normal function. Normal left ventricular size. There is no left ventricular hypertrophy.  2. Left ventricular diastolic Doppler parameters are consistent with impaired relaxation pattern of LV diastolic filling.  3. Global right ventricle has normal systolic function.The right ventricular size is normal. No increase in right ventricular wall thickness.  4. Left atrial size was normal.  5. Right atrial size was normal.  6. Mild mitral annular calcification.  7. The mitral valve is normal in structure. Trace mitral valve regurgitation. No evidence of mitral stenosis.  8. The tricuspid valve is not well visualized. Tricuspid valve regurgitation is trivial.  9. The aortic valve was not well visualized Aortic valve regurgitation was not visualized by color flow Doppler. 10. The pulmonic valve was not well visualized. Pulmonic valve regurgitation is not visualized by color flow Doppler. 11. Technically difficult; definity used; normal LV systolic function; grade 1 diastolic dysfunction.  Assessment:  [name] is a 83 y.o. male with history of DB, CAD, HTN who was seen at [facility] 06/04/2019 following a fall  presenting again to [facility] with difficulty speaking and understanding speech day of admission superimposed over of ongoing lethargy and gait instability. He did not receive IV t-PA due to presentation outside of the tPA  window.   Stroke:   R thalamic/internal capsule infarct in setting of intracranial atherosclerosis , small vessel disease and dehydration following recent episode of abdominal pain.  CT head No acute stroke. Small vessel disease. Chronic infarcts. ASPECTS 10.     CTA head & neck occluded R VA. L VA moderate to severe stenosis at origin. Distal R CCA 80%, prox R ICA 60% stenosis. prox L ICA 80% stenosis. Moderate R ICA and mild L ICA intracranial atherosclerosis. Aortic atherosclerosis.   CT perfusion negative  MRI  Small medial R thalamic and R internal capsule infarct. Old R basal ganglia and L occipital cortex infarcts.  Carotid Doppler pending  2D Echo EF 60-65%. No source of embolus   LDL 66  HgbA1c 5.8   Lovenox 40 mg sq daily for VTE prophylaxis   aspirin 325 mg daily prior to admission, now on aspirin 325 mg daily. Given intracranial atherosclerosis, will add plavix 75 to aspirin daily x 3 mos then PLAVIX alone  Therapy recommendations:  HH PT  Disposition:  pending  Hypertension  Stable . Permissive hypertension (OK if < 220/120) but gradually normalize in 5-7 days . Long-term BP goal 130-150 given severe intracranial atherosclerosis   Hyperlipidemia  Home meds:  lipitor 20, resumed in hospital  LDL 66, goal < 70  Continue statin at discharge  Diabetes type II, Controlled  HgbA1c 5.8, goal < 7.0  Other Stroke Risk Factors  Advanced age  Coronary artery disease  Other Active Problems  Metabolic encephalopathy   Anemia   Asthma  CKD III - 1.6->1.9 - added ongoing IVF per Dr. Leonie Man - repeat labs in am  Hospital day # 0   Thank you for this consultation and allowing [place] to participate in the care of this patient. He presented with confusion and speech difficulties due to right thalamic and internal capsule infarct likely from small vessel disease in the setting of dehydration and baseline extracranial intracranial atherosclerosis following an acute  abdominal illness which led to nausea vomiting and decreased intake and dehydration.  CT angiogram suggest bilateral extracranial carotid stenosis recommend we confirm this by checking carotid ultrasound and consider possible elective carotid revascularization.  Aspirin for now and aggressive risk factor modification.  IV fluids for hydration and repeat labs tomorrow morning.  Discussed with Dr. Eliseo Squires.  And with patient's wife and answered questions Greater than 50% time during this 55-minute consultation visit was spent in counseling and coordination of care about his lacunar strokes and discussion about carotid stenosis and possible revascularization options. Antony Contras, MD  [facility] [address] [place], [place] [address]  Phone [phone] Fax [phone] To contact Stroke Continuity provider, please refer to [email or web address] After hours, contact General Neurology

## 2019-06-12 DIAGNOSIS — R471 Dysarthria and anarthria: Secondary | ICD-10-CM

## 2019-06-12 DIAGNOSIS — N183 Chronic kidney disease, stage 3 unspecified: Secondary | ICD-10-CM

## 2019-06-12 DIAGNOSIS — I10 Essential (primary) hypertension: Secondary | ICD-10-CM

## 2019-06-12 DIAGNOSIS — R4781 Slurred speech: Secondary | ICD-10-CM

## 2019-06-12 LAB — GLUCOSE, CAPILLARY
Glucose-Capillary: 88 mg/dL (ref 70–99)
Glucose-Capillary: 117 mg/dL — ABNORMAL HIGH (ref 70–99)
Glucose-Capillary: 109 mg/dL — ABNORMAL HIGH (ref 70–99)
Glucose-Capillary: 107 mg/dL — ABNORMAL HIGH (ref 70–99)

## 2019-06-12 LAB — CBC
HCT: 30.4 % — ABNORMAL LOW (ref 39.0–52.0)
Hemoglobin: 10.3 g/dL — ABNORMAL LOW (ref 13.0–17.0)
MCH: 29.9 pg (ref 26.0–34.0)
MCHC: 33.9 g/dL (ref 30.0–36.0)
MCV: 88.4 fL (ref 80.0–100.0)
Platelets: 276 10*3/uL (ref 150–400)
RBC: 3.44 MIL/uL — ABNORMAL LOW (ref 4.22–5.81)
RDW: 14.7 % (ref 11.5–15.5)
WBC: 7.5 10*3/uL (ref 4.0–10.5)
nRBC: 0 % (ref 0.0–0.2)

## 2019-06-12 LAB — BASIC METABOLIC PANEL
Anion gap: 8 (ref 5–15)
BUN: 22 mg/dL (ref 8–23)
CO2: 23 mmol/L (ref 22–32)
Calcium: 8.9 mg/dL (ref 8.9–10.3)
Chloride: 106 mmol/L (ref 98–111)
Creatinine, Ser: 1.76 mg/dL — ABNORMAL HIGH (ref 0.61–1.24)
GFR calc Af Amer: 41 mL/min — ABNORMAL LOW (ref >60)
GFR calc non Af Amer: 36 mL/min — ABNORMAL LOW (ref >60)
Glucose, Bld: 85 mg/dL (ref 70–99)
Potassium: 3.8 mmol/L (ref 3.5–5.1)
Sodium: 137 mmol/L (ref 135–145)

## 2019-06-12 MED ORDER — LISINOPRIL 5 MG PO TABS: 5.0000 mg | ORAL_TABLET | Freq: Every morning | ORAL | Status: DC

## 2019-06-12 MED ORDER — CLOPIDOGREL BISULFATE 75 MG PO TABS: 75.0000 mg | ORAL_TABLET | Freq: Every day | ORAL | 0 refills | Status: DC

## 2019-06-12 MED ORDER — ASPIRIN 81 MG PO TBEC: 81.0000 mg | DELAYED_RELEASE_TABLET | Freq: Every day | ORAL | 0 refills | Status: AC

## 2019-06-12 MED ORDER — ATENOLOL 100 MG PO TABS: 100.0000 mg | ORAL_TABLET | Freq: Every morning | ORAL | Status: DC

## 2019-06-12 NOTE — Evaluation (Addendum)
Speech Language Pathology Evaluation Patient Details Name: [name] MRN: [id number] DOB: February 21, 1936 Today's Date: 06/12/2019 Time: 1020-1038 SLP Time Calculation (min) (ACUTE ONLY): 18 min  Problem List:  Patient Active Problem List   Diagnosis Date Noted  . CVA (cerebral vascular accident) ([place]) 06/11/2019  . Speech abnormality 06/10/2019   Past Medical History:  Past Medical History:  Diagnosis Date  . Anxiety   . Arthritis   . Asthma   . Coronary artery disease   . Diabetes mellitus without complication ([place])   . Gout   . HOH (hard of hearing) bilateral  . Hypertension   . PONV (postoperative nausea and vomiting)    Past Surgical History:  Past Surgical History:  Procedure Laterality Date  . CATARACT EXTRACTION W/PHACO  11/28/2011   Procedure: CATARACT EXTRACTION PHACO AND INTRAOCULAR LENS PLACEMENT (IOC);  Surgeon: Williams Che, MD;  Location: [facility];  Service: Ophthalmology;  Laterality: Right;  CDE 3.84  . CORONARY ARTERY BYPASS GRAFT  1997   6 vessels  . EYE SURGERY  1995   left eye cataract removal   . TOTAL KNEE  PROSTHESIS REMOVAL W/ SPACER INSERTION  8 yrs ago   right   HPI:  Pt is an 83 y.o. male with medical history significant for hypertension, diabetes mellitus, asthma, coronary artery disease, who was brought to the ED by EMS secondary to reports of confusion and garbled speech. MRI of the brain revealed small acute infarcts at the medial right thalamus and right internal capsule.    Assessment / Plan / Recommendation Clinical Impression  Evaluation of this patient was challenging due to pt's significant hearing impairment. Attempts were made to contact the pt's wife to determine the pt's baseline but these attempts were unsuccessful. He produced fluent speech without evidence of impairments in syntax but did not complete any structured naming tasks. Pt's sentences were occasionally slightly related to the SLP's questions which could be indicative  of auditory comprehension difficulty but could also be due to the hearing impairment. Stimuli were also presented orthographically; however, this did not improve accuracy. Concerning cognition, he exhibited difficulty with attention and orientation but further assessment of his cognition could not be completed due to his reduced attention and hearing. Mild dysarthria was noted characterized by reduced articulatory precision which inconsistently reduced speech intelligibility during conversation. SLP will continue to follow pt to perform further assessment and initiate intervention as clinically indicated.     SLP Assessment  SLP Recommendation/Assessment: Patient needs continued Speech Lanaguage Pathology Services SLP Visit Diagnosis: Cognitive communication deficit (R41.841);Dysarthria and anarthria (R47.1)    Follow Up Recommendations  Home health SLP;24 hour supervision/assistance    Frequency and Duration min 2x/week  2 weeks      SLP Evaluation Cognition  Overall Cognitive Status: Impaired/Different from baseline Arousal/Alertness: Awake/alert Orientation Level: Oriented to person;Disoriented to place;Disoriented to time;Disoriented to situation Attention: Focused;Sustained Focused Attention: Impaired Focused Attention Impairment: Verbal basic Sustained Attention: Impaired Sustained Attention Impairment: Verbal complex       Comprehension  Auditory Comprehension Overall Auditory Comprehension: Impaired Yes/No Questions: Impaired Basic Biographical Questions: (0/4) Commands: Impaired One Step Basic Commands: (0/4) Visual Recognition/Discrimination Discrimination: Not tested Reading Comprehension Reading Status: Not tested    Expression Expression Primary Mode of Expression: Verbal Verbal Expression Overall Verbal Expression: Impaired Initiation: No impairment Automatic Speech: (Pt did not complete any automatic sequences) Level of Generative/Spontaneous Verbalization:  Conversation(With noted confusion) Naming: Impairment Confrontation: (0/5; Pt stated color of one item but did not  name any) Convergent: Not tested Divergent: Not tested   Oral / Motor  Oral Motor/Sensory Function Overall Oral Motor/Sensory Function: Within functional limits Motor Speech Overall Motor Speech: Impaired Respiration: Within functional limits Phonation: Normal Resonance: Within functional limits Articulation: Impaired Level of Impairment: Sentence Intelligibility: Intelligibility reduced Word: 75-100% accurate Phrase: 75-100% accurate Sentence: 75-100% accurate Conversation: 75-100% accurate Motor Planning: Witnin functional limits Motor Speech Errors: Consistent   Biana Haggar I. Hardin Negus, [place], [place] Office number [phone] Pager [place] 06/12/2019, 11:02 AM

## 2019-06-12 NOTE — Progress Notes (Signed)
PROGRESS NOTE    [name]  IZT:[id number] DOB: [birth year] DOA: 06/10/2019 PCP: [name], MD   Brief Narrative: [name] is a 83 y.o. male with medical history significant forhypertension, diabetes mellitus, asthma, coronary artery disease. Patient presented secondary to confusion and garbled speech and found to have an acute CVA.   Assessment & Plan:   Principal Problem:   Speech abnormality Active Problems:   CVA (cerebral vascular accident) ([place])   Acute stroke MRI significant for R thalamic/internal capsule infarct CTA significant for 80% left proximal ICA stenosis and 80%/60% right distal/proximal ICA stenosi respectively; carotid dopplers with 60-79% stenosis bilaterally with non-visualized bilateral vertebral arteries. LDL of 66. Hemoglobin A1C of 5.8%. Neurology on board and recommending aspirin for 3 months in addition to Plavix indefinitely. Transthoracic Echocardiogram significant for no thrombus. -Continue aspirin, Plavix, Lipitor -PT/OT recommending CIR  Metabolic encephalopathy Home Xanax held. Seems to be resolved.  Essential hypertension Holding home regimen currently secondary to acute stroke  Diabetes mellitus, type 2 -Continue SSI  Abnormal CT Thickened esophagus seen. Likely gastritis. Will need GI follow-up.  Asthma Stable.  CAD -Continue aspirin and lipitor  CKD stage III Stable.   DVT prophylaxis: Lovenox Code Status:   Code Status: Full Code Family Communication: Wife and daughter on telephone Disposition Plan: Discharge to CIR. Medically stable   Consultants:   Neurology  CIR  Procedures:   Transthoracic Echocardiogram (06/12/2019) IMPRESSIONS    1. Left ventricular ejection fraction, by visual estimation, is 60 to 65%. The left ventricle has normal function. Normal left ventricular size. There is no left ventricular hypertrophy.  2. Left ventricular diastolic Doppler parameters are consistent with  impaired relaxation pattern of LV diastolic filling.  3. Global right ventricle has normal systolic function.The right ventricular size is normal. No increase in right ventricular wall thickness.  4. Left atrial size was normal.  5. Right atrial size was normal.  6. Mild mitral annular calcification.  7. The mitral valve is normal in structure. Trace mitral valve regurgitation. No evidence of mitral stenosis.  8. The tricuspid valve is not well visualized. Tricuspid valve regurgitation is trivial.  9. The aortic valve was not well visualized Aortic valve regurgitation was not visualized by color flow Doppler. 10. The pulmonic valve was not well visualized. Pulmonic valve regurgitation is not visualized by color flow Doppler. 11. Technically difficult; definity used; normal LV systolic function; grade 1 diastolic dysfunction.  FINDINGS  Left Ventricle: Left ventricular ejection fraction, by visual estimation, is 60 to 65%. The left ventricle has normal function. There is no left ventricular hypertrophy. Normal left ventricular size. Spectral Doppler shows Left ventricular diastolic  Doppler parameters are consistent with impaired relaxation pattern of LV diastolic filling.  Right Ventricle: The right ventricular size is normal. Global RV systolic function is has normal systolic function.  Left Atrium: Left atrial size was normal in size.  Right Atrium: Right atrial size was normal in size  Pericardium: There is no evidence of pericardial effusion.  Mitral Valve: The mitral valve is normal in structure. Mild mitral annular calcification. No evidence of mitral valve stenosis by observation. Trace mitral valve regurgitation.  Tricuspid Valve: The tricuspid valve is not well visualized. Tricuspid valve regurgitation is trivial by color flow Doppler.  Aortic Valve: The aortic valve was not well visualized. Aortic valve regurgitation was not visualized by color flow Doppler.  Pulmonic  Valve: The pulmonic valve was not well visualized. Pulmonic valve regurgitation is not visualized by color  flow Doppler.  Aorta: The aortic root, ascending aorta and aortic arch are all structurally normal, with no evidence of dilitation or obstruction.  Venous: The inferior vena cava was not well visualized.  IAS/Shunts: No atrial level shunt detected by color flow Doppler. No ventricular septal defect is seen or detected. There is no evidence of an atrial septal defect.  Antimicrobials:  None    Subjective: No issues  Objective: Vitals:   06/12/19 0340 06/12/19 0700 06/12/19 1100 06/12/19 1627  BP: (!) 190/76 (!) 157/69 (!) 187/65 (!) 182/58  Pulse: 72 72 72 70  Resp: 16   16  Temp: 98 F (36.7 C) 98 F (36.7 C) 98.2 F (36.8 C) 99.3 F (37.4 C)  TempSrc: Oral Oral Oral Oral  SpO2: 99% 100% 100% 98%  Weight:      Height:        Intake/Output Summary (Last 24 hours) at 06/12/2019 1701 Last data filed at 06/12/2019 0343 Gross per 24 hour  Intake 796.14 ml  Output 1050 ml  Net -253.86 ml   Filed Weights   06/10/19 1356 06/10/19 2109  Weight: 80 kg 84.6 kg    Examination:  General exam: Appears calm and comfortable Respiratory system: Clear to auscultation. Respiratory effort normal. Cardiovascular system: S1 & S2 heard, RRR. No murmurs, rubs, gallops or clicks. Gastrointestinal system: Abdomen is nondistended, soft and nontender. No organomegaly or masses felt. Normal bowel sounds heard. Central nervous system: Alert. Hearing deficit. Not answering questions appropriately. Extremities: No edema. No calf tenderness. Left hand with some contracture Skin: No cyanosis. No rashes    Data Reviewed: I have personally reviewed following labs and imaging studies  CBC: Recent Labs  Lab 06/10/19 1441 06/12/19 0411  WBC 8.2 7.5  NEUTROABS 5.9  --   HGB 10.2* 10.3*  HCT 32.9* 30.4*  MCV 92.4 88.4  PLT 319 017   Basic Metabolic Panel: Recent Labs  Lab  06/10/19 1441 06/12/19 0411  NA 132* 137  K 3.7 3.8  CL 100 106  CO2 22 23  GLUCOSE 215* 85  BUN 36* 22  CREATININE 1.98* 1.76*  CALCIUM 8.9 8.9   GFR: Estimated Creatinine Clearance: 34.6 mL/min (A) (by C-G formula based on SCr of 1.76 mg/dL (H)). Liver Function Tests: No results for input(s): AST, ALT, ALKPHOS, BILITOT, PROT, ALBUMIN in the last 168 hours. No results for input(s): LIPASE, AMYLASE in the last 168 hours. No results for input(s): AMMONIA in the last 168 hours. Coagulation Profile: Recent Labs  Lab 06/10/19 1441  INR 1.1   Cardiac Enzymes: No results for input(s): CKTOTAL, CKMB, CKMBINDEX, TROPONINI in the last 168 hours. BNP (last 3 results) No results for input(s): PROBNP in the last 8760 hours. HbA1C: Recent Labs    06/11/19 0851  HGBA1C 5.8*   CBG: Recent Labs  Lab 06/11/19 1623 06/11/19 2115 06/12/19 0559 06/12/19 1206 06/12/19 1629  GLUCAP 104* 87 88 117* 109*   Lipid Profile: Recent Labs    06/11/19 0851  CHOL 123  HDL 35*  LDLCALC 66  TRIG 110  CHOLHDL 3.5   Thyroid Function Tests: No results for input(s): TSH, T4TOTAL, FREET4, T3FREE, THYROIDAB in the last 72 hours. Anemia Panel: No results for input(s): VITAMINB12, FOLATE, FERRITIN, TIBC, IRON, RETICCTPCT in the last 72 hours. Sepsis Labs: Recent Labs  Lab 06/10/19 1441 06/10/19 1632  LATICACIDVEN 1.5 0.9    Recent Results (from the past 240 hour(s))  SARS Coronavirus 2 [facility] order, Performed in [facility] hospital  lab) Nasopharyngeal Nasopharyngeal Swab     Status: None   Collection Time: 06/10/19  6:29 PM   Specimen: Nasopharyngeal Swab  Result Value Ref Range Status   SARS Coronavirus 2 NEGATIVE NEGATIVE Final    Comment: (NOTE) If result is NEGATIVE SARS-CoV-2 target nucleic acids are NOT DETECTED. The SARS-CoV-2 RNA is generally detectable in upper and lower  respiratory specimens during the acute phase of infection. The lowest  concentration of SARS-CoV-2  viral copies this assay can detect is 250  copies / mL. A negative result does not preclude SARS-CoV-2 infection  and should not be used as the sole basis for treatment or other  patient management decisions.  A negative result may occur with  improper specimen collection / handling, submission of specimen other  than nasopharyngeal swab, presence of viral mutation(s) within the  areas targeted by this assay, and inadequate number of viral copies  (<250 copies / mL). A negative result must be combined with clinical  observations, patient history, and epidemiological information. If result is POSITIVE SARS-CoV-2 target nucleic acids are DETECTED. The SARS-CoV-2 RNA is generally detectable in upper and lower  respiratory specimens dur ing the acute phase of infection.  Positive  results are indicative of active infection with SARS-CoV-2.  Clinical  correlation with patient history and other diagnostic information is  necessary to determine patient infection status.  Positive results do  not rule out bacterial infection or co-infection with other viruses. If result is PRESUMPTIVE POSTIVE SARS-CoV-2 nucleic acids MAY BE PRESENT.   A presumptive positive result was obtained on the submitted specimen  and confirmed on repeat testing.  While 2019 novel coronavirus  (SARS-CoV-2) nucleic acids may be present in the submitted sample  additional confirmatory testing may be necessary for epidemiological  and / or clinical management purposes  to differentiate between  SARS-CoV-2 and other Sarbecovirus currently known to infect humans.  If clinically indicated additional testing with an alternate test  methodology [phone]) is advised. The SARS-CoV-2 RNA is generally  detectable in upper and lower respiratory sp ecimens during the acute  phase of infection. The expected result is Negative. Fact Sheet for Patients:  StrictlyIdeas.no Fact Sheet for Healthcare  Providers: BankingDealers.co.za This test is not yet approved or cleared by the [place] FDA and has been authorized for detection and/or diagnosis of SARS-CoV-2 by FDA under an Emergency Use Authorization (EUA).  This EUA will remain in effect (meaning this test can be used) for the duration of the COVID-19 declaration under Section 564(b)(1) of the Act, 21 U.S.C. section 360bbb-3(b)(1), unless the authorization is terminated or revoked sooner. Performed at [facility], [address] [place], [place] [address]          Radiology Studies: Mr Brain Wo Contrast  Result Date: 06/11/2019 CLINICAL DATA:  Acute onset garbled speech EXAM: MRI HEAD WITHOUT CONTRAST TECHNIQUE: Multiplanar, multiecho pulse sequences of the brain and surrounding structures were obtained without intravenous contrast. COMPARISON:  Head CT, CTA, and CT perfusion from yesterday FINDINGS: Brain: Small areas of restricted diffusion along the medial right thalamus and right corona radiata. Remote lacunar infarct with mineralization at the right basal ganglia. Small remote left occipital cortex infarct. Moderate chronic small vessel ischemic type gliosis in the cerebral white matter. Mild-to-moderate generalized volume loss. No acute hemorrhage, hydrocephalus, or masslike finding. Vascular: Absent right vertebral flow void which correlates with CTA. Other major flow voids are preserved. Skull and upper cervical spine: Negative for marrow lesion Sinuses/Orbits: No emergent  finding. There is minimal left mastoid opacification with negative nasopharynx. Bilateral cataract resection. IMPRESSION: 1. Small acute infarcts at the medial right thalamus and right internal capsule. 2. Chronic ischemic injury including remote right basal ganglia infarct and left occipital cortex infarct. Electronically Signed   By: Monte Fantasia M.D.   On: 06/11/2019 04:35   Vas US Carotid  Result Date: 06/12/2019 Carotid  Arterial Duplex Study Indications:   CVA and Carotid artery disease. Risk Factors:  Hypertension, Diabetes. Other Factors: CTA neck right ICA 60% left ICA 80% stenosis. Limitations    Today's exam was limited due to the patient's inability or                unwillingness to cooperate. Performing Technologist: June Leap RDMS, RVT  Examination Guidelines: A complete evaluation includes B-mode imaging, spectral Doppler, color Doppler, and power Doppler as needed of all accessible portions of each vessel. Bilateral testing is considered an integral part of a complete examination. Limited examinations for reoccurring indications may be performed as noted.  Right Carotid Findings: +----------+-------+-------+--------+---------------------------------+--------+             PSV     EDV     Stenosis Plaque Description                Comments              cm/s    cm/s                                                         +----------+-------+-------+--------+---------------------------------+--------+  CCA Prox   76      12                                                           +----------+-------+-------+--------+---------------------------------+--------+  CCA Distal 84      6                heterogenous                                +----------+-------+-------+--------+---------------------------------+--------+  ICA Prox   406     70      60-79%   heterogenous, calcific and                                                       irregular                                   +----------+-------+-------+--------+---------------------------------+--------+  ICA Distal 75      11                                                           +----------+-------+-------+--------+---------------------------------+--------+  ECA        423     26      >50%     heterogenous, calcific and                                                       irregular                                    +----------+-------+-------+--------+---------------------------------+--------+ +---------+--------+--------+------+  Vertebral PSV cm/s EDV cm/s Absent  +---------+--------+--------+------+  Left Carotid Findings: +----------+-------+-------+--------+---------------------------------+--------+             PSV     EDV     Stenosis Plaque Description                Comments              cm/s    cm/s                                                         +----------+-------+-------+--------+---------------------------------+--------+  CCA Prox   90      16                                                           +----------+-------+-------+--------+---------------------------------+--------+  CCA Distal 103     18                                                           +----------+-------+-------+--------+---------------------------------+--------+  ICA Prox   271     60      60-79%   heterogenous, calcific and                                                       irregular                                   +----------+-------+-------+--------+---------------------------------+--------+  ICA Distal 136     19                                                           +----------+-------+-------+--------+---------------------------------+--------+  ECA        170     5                                                            +----------+-------+-------+--------+---------------------------------+--------+ +---------+--------+--------+------+  Vertebral PSV cm/s EDV cm/s Absent  +---------+--------+--------+------+  Summary: Right Carotid: Velocities in the right ICA are consistent with a 60-79%                stenosis. The ECA appears >50% stenosed. Left Carotid: Velocities in the left ICA are consistent with a 60-79% stenosis. Vertebrals: Bilateral vertebral arteries were not visualized. *See table(s) above for measurements and observations.  Electronically signed by Antony Contras MD on 06/12/2019 at 12:39:16 PM.     Final         Scheduled Meds:  allopurinol  300 mg Oral QHS   aspirin EC  81 mg Oral Daily   atorvastatin  20 mg Oral QHS   clopidogrel  75 mg Oral Daily   enoxaparin (LOVENOX) injection  40 mg Subcutaneous Q24H   famotidine  20 mg Oral Daily   glipiZIDE  5 mg Oral q morning - 10a   insulin aspart  0-5 Units Subcutaneous QHS   insulin aspart  0-9 Units Subcutaneous TID WC   loratadine  10 mg Oral Daily   Continuous Infusions:  sodium chloride 70 mL/hr at 06/12/19 0748     LOS: 1 day     Cordelia Poche, MD [facility] 06/12/2019, 5:01 PM  If 7PM-7AM, please contact night-coverage [email or web address]

## 2019-06-12 NOTE — Evaluation (Addendum)
Occupational Therapy Evaluation Patient Details Name: [name] MRN: [id number] DOB: 07/16/1936 Today's Date: 06/12/2019    History of Present Illness 83yo male brought to the ED due to garbled speech and confusion/AMS. 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 Not a tpa candidate per neuro. PMH CAD, DM, gout, HOH, HTN, anxiety, CABG, hx R TKR   Clinical Impression   PT admitted with R thalamus and R internal capsule. Pt currently with functional limitiations due to the deficits listed below (see OT problem list). Pt currently with decrease activation of L UE and garbled speech at times. Pt benefits from watching lips and hearing aid in L ear. Pt initially could not name wife but when asked "where is [name]?" pt states "right there and turns to locate her on his Right".  Pt will benefit from skilled OT to increase their independence and safety with adls and balance to allow discharge CIR.     Follow Up Recommendations  CIR    Equipment Recommendations  3 in 1 bedside commode;Hospital bed    Recommendations for Other Services Rehab consult     Precautions / Restrictions Precautions Precautions: Fall;Other (comment) Precaution Comments: HOH, seems to also have a mix of expressive difficult and confusion Restrictions Weight Bearing Restrictions: No      Mobility Bed Mobility Overal bed mobility: Needs Assistance Bed Mobility: Supine to Sit     Supine to sit: Mod assist;Max assist     General bed mobility comments: pt progressing to L side of bed with max (A) to swing feet to eob to initiate the transfer. pt initially startled by the initiation but then engaged in task  Transfers Overall transfer level: Needs assistance Equipment used: 2 person hand held assist Transfers: Sit to/from Stand Sit to Stand: +2 physical assistance;Max assist         General transfer comment: pt requires x2  attempts to power up into standing . Pt states "wait a minute"     Balance Overall balance assessment: Needs assistance Sitting-balance support: No upper extremity supported;Feet supported Sitting balance-Leahy Scale: Good     Standing balance support: Single extremity supported;During functional activity Standing balance-Leahy Scale: Poor Standing balance comment: reliant on staff for balance                           ADL either performed or assessed with clinical judgement   ADL Overall ADL's : Needs assistance/impaired Eating/Feeding: Maximal assistance   Grooming: Maximal assistance Grooming Details (indicate cue type and reason): pt was able to remove dentures. pt was able to brush teeth mod (A). pt was able to wash face min (A). pt was more automatic with adls.  Upper Body Bathing: Maximal assistance   Lower Body Bathing: Total assistance   Upper Body Dressing : Maximal assistance   Lower Body Dressing: Total assistance   Toilet Transfer: +2 for physical assistance;Maximal assistance           Functional mobility during ADLs: +2 for physical assistance;Maximal assistance General ADL Comments: pt progresed from bed to sink (5 steps and then required sitting) pt with L knee sleeve for compression present int he room. Wife present throughout session     Vision Baseline Vision/History: Wears glasses Wears Glasses: At all times Additional Comments: central gaze preference. difficult to fully assess     Perception     Praxis  Pertinent Vitals/Pain Pain Assessment: No/denies pain     Hand Dominance Right   Extremity/Trunk Assessment Upper Extremity Assessment Upper Extremity Assessment: LUE deficits/detail LUE Deficits / Details: birth deficit - elbow flexion , shorter extermity and wrist contracture present. pt demonstrates decrease awareness to L UE this session. pt with decrease activation of L UE   Lower Extremity Assessment Lower Extremity  Assessment: Defer to PT evaluation   Cervical / Trunk Assessment Cervical / Trunk Assessment: Kyphotic   Communication Communication Communication: Expressive difficulties;HOH   Cognition Arousal/Alertness: Awake/alert Behavior During Therapy: Impulsive Overall Cognitive Status: Impaired/Different from baseline Area of Impairment: Attention;Following commands;Awareness                 Orientation Level: Disoriented to;Time;Situation Current Attention Level: Sustained Memory: Decreased short-term memory Following Commands: Follows one step commands with increased time Safety/Judgement: Decreased awareness of deficits;Decreased awareness of safety Awareness: Intellectual Problem Solving: Slow processing;Difficulty sequencing General Comments: pt benefits from lip reading and slow minimal word use such as "wash your face" pt with hearing aide fixed this session which helped with understanding. pt talking about hospital but demonstrates word salad at times.    General Comments  baseline L knee pain    Exercises     Shoulder Instructions      Home Living Family/patient expects to be discharged to:: Skilled nursing facility                                        Prior Functioning/Environment Level of Independence: Independent        Comments: wife reports he did everything for himself        OT Problem List: Decreased strength;Decreased activity tolerance;Impaired balance (sitting and/or standing);Decreased safety awareness;Decreased knowledge of use of DME or AE;Decreased knowledge of precautions;Obesity;Impaired UE functional use;Pain;Decreased cognition;Impaired vision/perception      OT Treatment/Interventions: Self-care/ADL training;Therapeutic exercise;Neuromuscular education;Energy conservation;DME and/or AE instruction;Manual therapy;Modalities;Therapeutic activities;Visual/perceptual remediation/compensation;Patient/family education;Balance  training;Cognitive remediation/compensation    OT Goals(Current goals can be found in the care plan section) Acute Rehab OT Goals Patient Stated Goal: talking about how peopel go to the hospital too soon and too often OT Goal Formulation: With patient/family Time For Goal Achievement: 06/26/19 Potential to Achieve Goals: Good ADL Goals Pt Will Perform Grooming: with set-up;sitting Pt Will Perform Upper Body Bathing: with set-up;sitting Pt Will Perform Lower Body Bathing: with min assist;sit to/from stand Pt Will Transfer to Toilet: with min assist;ambulating;bedside commode Additional ADL Goal #1: pt will complete bed mobility supervision as precursor toa dls.  OT Frequency: Min 2X/week   Barriers to D/C:            Co-evaluation PT/OT/SLP Co-Evaluation/Treatment: Yes Reason for Co-Treatment: For patient/therapist safety;To address functional/ADL transfers PT goals addressed during session: Mobility/safety with mobility OT goals addressed during session: ADL's and self-care;Strengthening/ROM;Proper use of Adaptive equipment and DME      AM-PAC OT "6 Clicks" Daily Activity     Outcome Measure Help from another person eating meals?: A Lot Help from another person taking care of personal grooming?: A Little Help from another person toileting, which includes using toliet, bedpan, or urinal?: A Lot Help from another person bathing (including washing, rinsing, drying)?: A Lot Help from another person to put on and taking off regular upper body clothing?: A Lot Help from another person to put on and taking off regular lower body clothing?: Total 6 Click  Score: 12   End of Session Equipment Utilized During Treatment: Gait belt Nurse Communication: Mobility status;Precautions  Activity Tolerance: Patient tolerated treatment well Patient left: in chair;with call bell/phone within reach;with chair alarm set  OT Visit Diagnosis: Unsteadiness on feet (R26.81);Muscle weakness  (generalized) (M62.81)                Time: 5339-1792 OT Time Calculation (min): 37 min Charges:  OT General Charges $OT Visit: 1 Visit OT Evaluation $OT Eval Moderate Complexity: 1 Mod   Jeri Modena, OTR/L  Acute Rehabilitation Services Pager: [phone] Office: [phone] .   Jeri Modena 06/12/2019, 4:58 PM

## 2019-06-12 NOTE — Discharge Instructions (Signed)
[name],  You were in the hospital because of a stroke. You will need continued therapy on discharge. Your medications have been adjusted. You will be discharged on aspirin 81 mg for which you need to take for 3 months. You will also be discharged on Plavix 75 mg for which you will take indefinitely. Please follow-up with your PCP and neurologist.

## 2019-06-12 NOTE — Progress Notes (Signed)
STROKE TEAM PROGRESS NOTE   Subjective :  Patient is lying comfortably in bed.  He does not have his hearing aid.  His wife is not present.  Is difficult to communicate with him because of his hearing problem.  He appears to speak clearly but has impaired comprehension but unclear to know how much of this is due to hearing impairment.  Patient has passed swallow eval.  Today's Vitals   06/12/19 0340 06/12/19 0700 06/12/19 0800 06/12/19 1100  BP: (!) 190/76 (!) 157/69  (!) 187/65  Pulse: 72 72  72  Resp: 16     Temp: 98 F (36.7 C) 98 F (36.7 C)  98.2 F (36.8 C)  TempSrc: Oral Oral  Oral  SpO2: 99% 100%  100%  Weight:      Height:      PainSc:   0-No pain    Body mass index is 26.76 kg/m. Pleasant elderly caucasian male not in distress. . Afebrile. Head is nontraumatic. Neck is supple without bruit.    Cardiac exam no murmur or gallop. Lungs are clear to auscultation. Distal pulses are well felt.He has phocomelic deformity in the left upper extremity which is smaller in length and there is flexion deformity at the left wrist and fingers with hypotonia Neurological Exam :  Awake alert oriented to place and person.  He has significant decreased hearing and has trouble understanding and communicating but follows simple commands well.  Speech has slight dysarthria but appears to be easy to understand.  There is no aphasia.  Extraocular movements are full range without nystagmus.  Face is symmetric without weakness.  Tongue is midline.  Motor system exam shows mild left upper extremity drift and 3/5 weakness with distal greater than proximal weakness which is old from both deformity.  Lower extremity exam shows mild drift bilaterally though patient's cooperation is limited due to difficulty understanding the command.  Tone is and is equal in both lower extremities.  Sensation appears preserved bilaterally.  Both plantars are downgoing.  Gait not tested.  IMPRESSION : [name] is a 83  y.o. male with history of DB, CAD, HTN who was seen at [facility] 06/04/2019 following a fall  presenting again to [facility] with difficulty speaking and understanding speech day of admission superimposed over of ongoing lethargy and gait instability.   He has shown on MRI R thalamic/internal capsule infarct in setting of intracranial atherosclerosis , small vessel disease and dehydration following recent episode of abdominal pain  CT head No acute stroke. Small vessel disease. Chronic infarcts. ASPECTS 10.     CTA head & neck occluded R VA. L VA moderate to severe stenosis at origin. Distal R CCA 80%, prox R ICA 60% stenosis. prox L ICA 80% stenosis. Moderate R ICA and mild L ICA intracranial atherosclerosis. Aortic atherosclerosis.   CT perfusion negative  MRI  Small medial R thalamic and R internal capsule infarct. Old R basal ganglia and L occipital cortex infarcts.  Carotid Doppler  67 9% right ICA and 67 9% left ICA stenosis.  Both the vertebral arteries were not visualized  2D Echo EF 60-65%. No source of embolus   LDL 66  HgbA1c 5.8   Lovenox 40 mg sq daily for VTE prophylaxis   aspirin 325 mg daily prior to admission, now on aspirin 325 mg daily. Given intracranial atherosclerosis, will add plavix 75 to aspirin daily x 3 mos then PLAVIX alone  Therapy recommendations Home Health  PT  PLAN : continue aspirin and Plavix for 3 weeks followed by Plavix alone for stroke prevention and aggressive risk factor modification.  Maintain IV hydration.  No need for carotid revascularization at the present time given his poor general medical status and present infarcts are likely due to small vessel disease may consider elective referral to vascular surgery later as an outpatient.  Discussed with patient and Dr. Eliseo Squires.  Stroke team will sign off.  Follow-up as an outpatient stroke clinic in 6 weeks.  Greater than 50% time during this 25-minute visit was spent on counseling and  coordination of care about his stroke discussion about carotid stenosis and stroke prevention and answering questions Antony Contras, MD Medical Director [facility] Pager: [phone]  06/12/2019 1:24 PM

## 2019-06-12 NOTE — Progress Notes (Signed)
06/12/19 1650  PT Visit Information  Last PT Received On 06/12/19  Assistance Needed +2  PT/OT/SLP Co-Evaluation/Treatment Yes  Reason for Co-Treatment For patient/therapist safety;To address functional/ADL transfers  PT goals addressed during session Mobility/safety with mobility  History of Present Illness 83yo male brought to the ED due to garbled speech and confusion/AMS. 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 Not a tpa candidate per neuro. PMH CAD, DM, gout, HOH, HTN, anxiety, CABG, hx R TKR  Subjective Data  Patient Stated Goal talking about how peopel go to the hospital too soon and too often  Precautions  Precautions Fall;Other (comment)  Precaution Comments HOH, seems to also have a mix of expressive difficult and confusion  Restrictions  Weight Bearing Restrictions No  Pain Assessment  Pain Assessment No/denies pain  Cognition  Arousal/Alertness Awake/alert  Behavior During Therapy Impulsive  Overall Cognitive Status Impaired/Different from baseline  Area of Impairment Attention;Following commands;Awareness  Orientation Level Disoriented to;Time;Situation  Current Attention Level Sustained  Memory Decreased short-term memory  Following Commands Follows one step commands with increased time  Safety/Judgement Decreased awareness of deficits;Decreased awareness of safety  Awareness Intellectual  Problem Solving Slow processing;Difficulty sequencing  General Comments pt benefits from lip reading and slow minimal word use such as "wash your face" pt with hearing aide fixed this session which helped with understanding. pt talking about hospital but demonstrates word salad at times.   Bed Mobility  Overal bed mobility Needs Assistance  Bed Mobility Supine to Sit  Supine to sit Mod assist;Max assist  General bed mobility comments pt progressing to L side of bed with max (A) to swing feet to eob to  initiate the transfer. pt initially startled by the initiation but then engaged in task  Transfers  Overall transfer level Needs assistance  Equipment used 2 person hand held assist  Transfers Sit to/from Stand  Sit to Stand +2 physical assistance;Max assist  General transfer comment pt requires x2 attempts to power up into standing . Pt states "wait a minute"   Ambulation/Gait  Ambulation/Gait assistance Max assist;Mod assist;+2 physical assistance  Gait Distance (Feet) 5 Feet  Assistive device 2 person hand held assist  Gait Pattern/deviations Step-through pattern;Decreased stride length;Staggering left;Narrow base of support  General Gait Details Slow, very unsteady gait. Pt with L lateral lean during gait and requiring mod to max A +2 for steadying. Very narrow BOS and had to get chair as pt attempting to prematurely sit down.   Gait velocity Decreased  Balance  Overall balance assessment Needs assistance  Sitting-balance support No upper extremity supported;Feet supported  Sitting balance-Leahy Scale Good  Standing balance support Single extremity supported;During functional activity  Standing balance-Leahy Scale Poor  Standing balance comment reliant on staff for balance  General Comments  General comments (skin integrity, edema, etc.) baseline L knee pain  PT - End of Session  Equipment Utilized During Treatment Gait belt  Activity Tolerance Patient tolerated treatment well  Patient left in chair;with call bell/phone within reach;with chair alarm set;with family/visitor present  Nurse Communication Mobility status   PT - Assessment/Plan  PT Plan Discharge plan needs to be updated;Frequency needs to be updated  PT Visit Diagnosis Unsteadiness on feet (R26.81);Muscle weakness (generalized) (M62.81);Difficulty in walking, not elsewhere classified (R26.2);Other symptoms and signs involving the nervous system (R29.898)  PT Frequency (ACUTE ONLY) Min 4X/week  Recommendations for  Other Services Rehab consult  Follow Up  Recommendations CIR  PT equipment Rolling walker with 5" wheels;3in1 (PT)  AM-PAC PT "6 Clicks" Mobility Outcome Measure (Version 2)  Help needed turning from your back to your side while in a flat bed without using bedrails? 3  Help needed moving from lying on your back to sitting on the side of a flat bed without using bedrails? 2  Help needed moving to and from a bed to a chair (including a wheelchair)? 2  Help needed standing up from a chair using your arms (e.g., wheelchair or bedside chair)? 2  Help needed to walk in hospital room? 2  Help needed climbing 3-5 steps with a railing?  1  6 Click Score 12  Consider Recommendation of Discharge To: CIR/SNF/LTACH  PT Goal Progression  Progress towards PT goals Progressing toward goals  Acute Rehab PT Goals  PT Goal Formulation With patient/family  PT Time Calculation  PT Start Time (ACUTE ONLY) 1305  PT Stop Time (ACUTE ONLY) 1335  PT Time Calculation (min) (ACUTE ONLY) 30 min  PT General Charges  $$ ACUTE PT VISIT 1 Visit  PT Treatments  $Therapeutic Activity 8-22 mins   Pt able to perform ambulation tasks this session, however, requiring increased assist this session. Required mod to max +2 to ambulate a few feet. Poor balance noted with  L lateral lean. Pt extremely HOH and has expressive deficits. Feel pt would benefit from CIR level therapies given current mobility deficits, so updated recommendations. Pt with good support from his wife and was previously very independent. Will continue to follow acutely to maximize functional mobility independence and safety.   Leighton Ruff, PT, DPT  Acute Rehabilitation Services  Pager: [phone] Office: [phone]

## 2019-06-12 NOTE — Progress Notes (Signed)
SLP Cancellation Note  Patient Details Name: [name] MRN: [id number] DOB: 09/08/1936   Cancelled treatment:          Pt passed [facility] swallow screen; therefore, no formalized SLP swallow eval is needed per protocol.     Keajah Killough I. Hardin Negus, [place], [place] Office number [phone] Pager [place] 06/12/2019, 11:10 AM

## 2019-06-13 LAB — GLUCOSE, CAPILLARY
Glucose-Capillary: 93 mg/dL (ref 70–99)
Glucose-Capillary: 54 mg/dL — ABNORMAL LOW (ref 70–99)
Glucose-Capillary: 74 mg/dL (ref 70–99)
Glucose-Capillary: 110 mg/dL — ABNORMAL HIGH (ref 70–99)
Glucose-Capillary: 149 mg/dL — ABNORMAL HIGH (ref 70–99)

## 2019-06-13 MED ORDER — NYSTATIN 100000 UNIT/GM EX CREA
1.0000 "application " | TOPICAL_CREAM | Freq: Every day | CUTANEOUS | Status: DC | PRN
  Filled 2019-06-13: qty 15

## 2019-06-13 MED ORDER — ALBUTEROL SULFATE (2.5 MG/3ML) 0.083% IN NEBU: 2.5000 mg | INHALATION_SOLUTION | Freq: Four times a day (QID) | RESPIRATORY_TRACT | Status: DC | PRN

## 2019-06-13 NOTE — Significant Event (Signed)
Rapid Response Event Note  Overview: Cardiac  Initial Focused Assessment: RN called with concerns of patient having VT. I asked the nurse to get an EKG and full set of VS. I was with another patient on a call. Upon arrival, [name] was in is usual state, not in acute distress, skin warm and dry. EKG was done, wide QRS with LBBB, it appeared like it could have been AF. I reviewed the telemetry as well, there might have a brief period of AF and perhaps some AFlutter but currently, it seems to NSR. HR 70s, stable BP.   Interventions: -- None --   Plan of Care: -- RN to notify MD.  -- Currently rhythm is NSR.   Event Summary:  Call Time 1336 Arrival Time 1345 End Time 1405  [name] R

## 2019-06-13 NOTE — H&P (Signed)
Physical Medicine and Rehabilitation Admission H&P    Chief Complaint  Patient presents with  . Code Stroke  : HPI: [name] is an 83 year old right-handed male with history of CAD with CABG 1997 maintained on aspirin, hard of hearing, CKD stage III, diabetes mellitus, hypertension, asthma.  History taken from chart review and wife due to cognition. Presented 06/10/2019 with recent fall 10 days ago without loss of consciousness.  Family notes decrease in balance as well as bouts of nausea.  Urine drug screen positive benzos, sodium 132, creatinine 1.98, lactic acid within normal limits alcohol level negative, COVID negative.  Cranial CT scan negative for any acute intracranial abnormalities.  Chronic small vessel ischemic with small chronic infarcts.  CT angiogram head and neck occluded right vertebral artery.  80% proximal left ICA stenosis.  MRI showing small medial right thalamic and internal capsule infarcts. Carotid Dopplers did show 67.9% right ICA and 67.9% left ICA stenosis.  Echocardiogram with EF of 65%. Neurology consulted placed on aspirin 81 mg daily plus Plavix 75 mg daily x3 months then Plavix alone.  Subcutaneous Lovenox for DVT prophylaxis.  Plan is to follow up as outpatient with vascular surgery for carotid stenosis.  Tolerating a regular consistency diet.  Therapy evaluation completed and patient was admitted for a comprehensive rehab program  Review of Systems  Unable to perform ROS: Mental acuity   Past Medical History:  Diagnosis Date  . Anxiety   . Arthritis   . Asthma   . Coronary artery disease   . Diabetes mellitus without complication ([place])   . Gout   . HOH (hard of hearing) bilateral  . Hypertension   . PONV (postoperative nausea and vomiting)   . Right thalamic infarction ([place]) 06/17/2019   Past Surgical History:  Procedure Laterality Date  . CATARACT EXTRACTION W/PHACO  11/28/2011   Procedure: CATARACT EXTRACTION PHACO AND INTRAOCULAR LENS  PLACEMENT (IOC);  Surgeon: Williams Che, MD;  Location: [facility];  Service: Ophthalmology;  Laterality: Right;  CDE 3.84  . CORONARY ARTERY BYPASS GRAFT  1997   6 vessels  . EYE SURGERY  1995   left eye cataract removal   . TOTAL KNEE  PROSTHESIS REMOVAL W/ SPACER INSERTION  8 yrs ago   right   Family History  Problem Relation Age of Onset  . Anesthesia problems Neg Hx   . Hypotension Neg Hx   . Malignant hyperthermia Neg Hx   . Pseudochol deficiency Neg Hx    Social History:  reports that he has never smoked. He has never used smokeless tobacco. He reports that he does not drink alcohol or use drugs. Allergies:  Allergies  Allergen Reactions  . Nsaids Nausea And Vomiting   Medications Prior to Admission  Medication Sig Dispense Refill  . albuterol (VENTOLIN HFA) 108 (90 Base) MCG/ACT inhaler Inhale 1-2 puffs into the lungs every 6 (six) hours as needed for wheezing or shortness of breath.     . allopurinol (ZYLOPRIM) 300 MG tablet Take 300 mg by mouth at bedtime.     . ALPRAZolam (XANAX) 0.5 MG tablet Take 0.25-0.5 mg by mouth 3 (three) times daily as needed for anxiety.     [name] amLODipine (NORVASC) 5 MG tablet Take 5 mg by mouth every morning.     [name] aspirin EC 81 MG EC tablet Take 1 tablet (81 mg total) by mouth daily. Take for three months. 30 tablet 0  . atenolol (TENORMIN) 100 MG tablet Take  1 tablet (100 mg total) by mouth every morning.    [name] atorvastatin (LIPITOR) 40 MG tablet Take 20 mg by mouth at bedtime.     . cholecalciferol (VITAMIN D3) 25 MCG (1000 UT) tablet Take 1,000 Units by mouth every evening.    . clopidogrel (PLAVIX) 75 MG tablet Take 1 tablet (75 mg total) by mouth daily. 30 tablet 0  . co-enzyme Q-10 30 MG capsule Take 30 mg by mouth at bedtime.    . famotidine (PEPCID) 20 MG tablet Take 1 tablet (20 mg total) by mouth daily. 30 tablet 0  . furosemide (LASIX) 20 MG tablet Take 20 mg by mouth every morning. *May take as needed for fluid retention    .  glipiZIDE (GLUCOTROL) 5 MG tablet Take 5 mg by mouth every morning.     [name] lisinopril (ZESTRIL) 5 MG tablet Take 1 tablet (5 mg total) by mouth every morning.    . loratadine (CLARITIN) 10 MG tablet Take 10 mg by mouth daily.    . Lutein 20 MG CAPS Take 1 capsule by mouth every morning.     . nystatin cream (MYCOSTATIN) Apply 1 application topically daily as needed for dry skin ((genital areas)).    [name] ondansetron (ZOFRAN ODT) 4 MG disintegrating tablet Take 1 tablet (4 mg total) by mouth every 8 (eight) hours as needed for nausea or vomiting. 10 tablet 0  . theophylline (THEODUR) 300 MG 12 hr tablet Take 300 mg by mouth 2 (two) times daily.    [name] triamcinolone cream (KENALOG) 0.1 % Apply 1 application topically daily as needed (for irritation (face)).     . vitamin B-12 (CYANOCOBALAMIN) 250 MCG tablet Take 250 mcg by mouth every evening.    . vitamin C (ASCORBIC ACID) 500 MG tablet Take 500 mg by mouth every evening.    [name] VITAMIN E COMPLEX PO Take 1 capsule by mouth every evening.      Drug Regimen Review Drug regimen was reviewed and remains appropriate with no significant issues identified  Home: Home Living Family/patient expects to be discharged to:: Skilled nursing facility Available Help at Discharge: Family Type of Home: (Lives at home- unsure of type) Additional Comments: patient not able to provide information, family not present to clarify  Lives With: Spouse   Functional History: Prior Function Level of Independence: Independent Comments: wife reports he did everything for himself  Functional Status:  Mobility: Bed Mobility Overal bed mobility: Needs Assistance Bed Mobility: Supine to Sit Supine to sit: Mod assist Sit to supine: Mod assist General bed mobility comments: Mod assistance to advance LEs to edge of bed and to elevate trunk into a seated position. Transfers Overall transfer level: Needs assistance Equipment used: Rolling walker (2 wheeled) Transfers: Sit  to/from Stand Sit to Stand: Mod assist Stand pivot transfers: Max assist General transfer comment: Pt performed face to face transfer with posterior bias.  He was able to perform steps away from bed toward his chair before sitting. Ambulation/Gait Ambulation/Gait assistance: Max assist Gait Distance (Feet): 4 Feet Assistive device: None Gait Pattern/deviations: Shuffle, Leaning posteriorly General Gait Details: Side steps to the R from bed to recliner chair. Gait velocity: Decreased    ADL: ADL Overall ADL's : Needs assistance/impaired Eating/Feeding: Maximal assistance Grooming: Maximal assistance Grooming Details (indicate cue type and reason): pt was able to remove dentures. pt was able to brush teeth mod (A). pt was able to wash face min (A). pt was more automatic with adls.  Upper  Body Bathing: Maximal assistance Lower Body Bathing: Total assistance Upper Body Dressing : Maximal assistance Lower Body Dressing: Total assistance Toilet Transfer: +2 for physical assistance, Maximal assistance Functional mobility during ADLs: +2 for physical assistance, Maximal assistance General ADL Comments: pt progresed from bed to sink (5 steps and then required sitting) pt with L knee sleeve for compression present int he room. Wife present throughout session  Cognition: Cognition Overall Cognitive Status: Impaired/Different from baseline Arousal/Alertness: Awake/alert Orientation Level: Oriented to person, Oriented to place, Disoriented to time, Disoriented to situation Attention: Focused, Sustained Focused Attention: Impaired Focused Attention Impairment: Verbal basic Sustained Attention: Impaired Sustained Attention Impairment: Verbal complex Cognition Arousal/Alertness: Awake/alert Behavior During Therapy: Impulsive Overall Cognitive Status: Impaired/Different from baseline Area of Impairment: Problem solving, Safety/judgement, Following commands Orientation Level: Disoriented to,  Place, Time, Situation Current Attention Level: Sustained Memory: Decreased short-term memory Following Commands: Follows one step commands inconsistently, Follows one step commands with increased time Safety/Judgement: Decreased awareness of safety, Decreased awareness of deficits Awareness: Intellectual Problem Solving: Slow processing, Difficulty sequencing, Decreased initiation, Requires verbal cues, Requires tactile cues General Comments: very slow processing this afternoon, almost seems like he was sundowning  Physical Exam: Blood pressure (!) 175/72, pulse 80, temperature 99.6 F (37.6 C), temperature source Oral, resp. rate 17, height 5\' 10"  (1.778 m), weight 84.6 kg, SpO2 99 %. Physical Exam  Vitals reviewed. Constitutional: He appears well-developed and well-nourished.  HENT:  Head: Normocephalic and atraumatic.  Eyes: EOM are normal. Right eye exhibits no discharge. Left eye exhibits no discharge.  Neck: No tracheal deviation present. No thyromegaly present.  Respiratory: Effort normal. No respiratory distress.  GI: He exhibits no distension.  Musculoskeletal:        General: Deformity (LUE congenital) present. No tenderness or edema.  Neurological: He is alert.  Oriented to person only. HOH Motor: Limited due to ability to follow commands Moving LUE freely RUE limited due to congenital deficit Limited spontaneous movements b/l LE  Skin: Skin is warm and dry.  Psychiatric:  Limited due to cognition    Results for orders placed or performed during the hospital encounter of 06/10/19 (from the past 48 hour(s))  Glucose, capillary     Status: Abnormal   Collection Time: 06/15/19  9:25 PM  Result Value Ref Range   Glucose-Capillary 166 (H) 70 - 99 mg/dL  Glucose, capillary     Status: None   Collection Time: 06/16/19  6:16 AM  Result Value Ref Range   Glucose-Capillary 95 70 - 99 mg/dL  Glucose, capillary     Status: Abnormal   Collection Time: 06/16/19 11:24 AM   Result Value Ref Range   Glucose-Capillary 149 (H) 70 - 99 mg/dL  Glucose, capillary     Status: Abnormal   Collection Time: 06/16/19  4:13 PM  Result Value Ref Range   Glucose-Capillary 161 (H) 70 - 99 mg/dL  Glucose, capillary     Status: Abnormal   Collection Time: 06/16/19  9:14 PM  Result Value Ref Range   Glucose-Capillary 119 (H) 70 - 99 mg/dL  Glucose, capillary     Status: Abnormal   Collection Time: 06/17/19  6:37 AM  Result Value Ref Range   Glucose-Capillary 127 (H) 70 - 99 mg/dL   Comment 1 Notify RN    Comment 2 Document in Chart   Glucose, capillary     Status: Abnormal   Collection Time: 06/17/19 12:03 PM  Result Value Ref Range   Glucose-Capillary 137 (H) 70 -  99 mg/dL   Comment 1 Notify RN    Comment 2 Document in Chart   Glucose, capillary     Status: Abnormal   Collection Time: 06/17/19  3:42 PM  Result Value Ref Range   Glucose-Capillary 142 (H) 70 - 99 mg/dL   Comment 1 Notify RN    Comment 2 Document in Chart    No results found.     Medical Problem List and Plan: 1.  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  Old right basal ganglia and left occipital cortex infarction  Admit to CIR. 2.  Antithrombotics: -DVT/anticoagulation: Lovenox  -antiplatelet therapy: Aspirin 81 mg daily, Plavix 75 mg daily x3 months then Plavix alone 3. Pain Management: Tylenol as needed 4. Mood: Provide emotional support  -antipsychotic agents: N/A 5. Neuropsych: This patient is not capable of making decisions on his own behalf. 6. Skin/Wound Care: Routine skin checks 7. Fluids/Electrolytes/Nutrition: Routine in and outs  CMP ordered for tomorrow 8.  Bilateral carotid stenosis.  Follow-up vascular surgery as outpatient. 9.  Diabetes mellitus.  Hemoglobin A1c 5.8.  Glucotrol 5 mg daily.  Monitor with increased mobility.  10.  Permissive hypertension.  Presently on Norvasc 5mg   daily.  Patient on Tenormin 100 mg daily and lisinopril 5 mg daily prior to admission.  Resume as needed  Monitor with increased mobility.  11.  CKD stage III.  Labs ordered for tomorrow AM.  12.  Hyperlipidemia.  Lipitor 13.  CAD with CABG 1997.  No chest pain or shortness of breath.  Continue aspirin and Plavix 14.  History of gout.  Zyloprim 300 mg daily.  Monitor for gout flareups 15.  Hard of hearing. 16.  History of asthma.  Monitor for increased shortness of breath.  Patient on albuterol inhaler as needed as well as theophylline 300 mg twice daily prior to admission.  Lavon Paganini Angiulli, PA-C 06/17/2019   I have personally performed a face to face diagnostic evaluation, including, but not limited to relevant history and physical exam findings, of this patient and developed relevant assessment and plan.  Additionally, I have reviewed and concur with the physician assistant's documentation above.  Delice Lesch, MD, ABPMR

## 2019-06-13 NOTE — PMR Pre-admission (Signed)
PMR Admission Coordinator Pre-Admission Assessment  Patient: [name] is an 83 y.o., male MRN: [id number] DOB: October 14, 1935 Height: '5\' 10"'$  (177.8 cm) Weight: 84.6 kg  Insurance Information HMO: yes    PPO:      PCP:      IPA:      80/20:      OTHER:  PRIMARY: [employer] Medicare      Policy#: [id number]      Subscriber: Patient CM Name: [name]      Phone#: [phone]     Fax#: (original updated clinicals were faxed to: [phone]) Pre-Cert#: [id number]      Employer:  Auth provided by Denyse Amass for admit to CIR. Pt is approved for 7 days, starting 10/5. Clinical updates are due on 10/11 to (f): [phone] (no follow up CM assigned at this time.) Benefits:  Phone #: online     Name: [email or web address] Eff. Date: 09/12/2018 - 09/12/2019     Deduct: does not have ($0)      Out of Pocket Max: $3,600 ($15.00 met)      Life Max:  CIR: $295/day co-pay for days 1-5, $0/day co-pay for days 6+      SNF: $0/day co-pay for days 1-20, $160/day co-pay for days 21-43, $0/day co-pay for days 44-100; limited to 100 days/cal yr Outpatient: limited by medical necessity     Co-Pay: $30/visit Home Health: 100% coverage; limited by medical necessity      Co-Pay: 0% co-insurance DME: 80% coverage     Co-Pay: 20% co-insurance  Providers:  SECONDARY: None      Policy#:       Subscriber:  CM Name:       Phone#:      Fax#:  Pre-Cert#:       Employer:  Benefits:  Phone #:      Name:  Eff. Date:     Deduct:       Out of Pocket Max:       Life Max:  CIR:       SNF:  Outpatient     Co-Pay:  Home Health:       Co-Pay:  DME:      Co-Pay:   Medicaid Application Date:       Case Manager:  Disability Application Date:       Case Worker:   The "Data Collection Information Summary" for patients in Inpatient Rehabilitation Facilities with attached "Privacy Act [place] Records" was provided and verbally reviewed with: Family  Emergency Contact Information Contact Information    Name Relation Home Work [name] Spouse [phone]   [name] Daughter   [phone]      Current Medical History  Patient Admitting Diagnosis: Right thalamic/internal capsule infarct  History of Present Illness: [name] is an 83 year old male with history of CAD with CABG 1997 maintained on aspirin, hard of hearing, CKD stage III, diabetes mellitus, hypertension, asthma.  Presented 06/10/2019 with recent fall 10 days ago without loss of consciousness.  Family notes decrease in balance as well as bouts of nausea.  Urine drug screen positive benzos, sodium 132, creatinine 1.98, lactic acid within normal limits alcohol level negative, COVID negative.  Cranial CT scan negative for any acute intracranial abnormalities.  Chronic small vessel ischemic with small chronic infarcts.  CT angiogram head and neck occluded right vertebral artery.  80% proximal left ICA stenosis.  MRI showed small acute infarcts medial right thalamus and right internal capsule.  Carotid  Dopplers did show 67.9% right ICA and 67.9% left ICA stenosis.  Echocardiogram with ejection fraction 65% without emboli.  Neurology consulted placed on aspirin 81 mg daily plus Plavix 75 mg daily x3 months then Plavix alone.  Subcutaneous Lovenox for DVT prophylaxis.  There was a plan follow-up outpatient vascular surgery to address carotid stenosis.  Tolerating a regular consistency diet.  Therapy evaluation completed and patient is to be admitted for a comprehensive rehab program on 06/17/2019   Complete NIHSS TOTAL: 10  Patient's medical record from [facility] has been reviewed by the rehabilitation admission coordinator and physician.  Past Medical History  Past Medical History:  Diagnosis Date  . Anxiety   . Arthritis   . Asthma   . Coronary artery disease   . Diabetes mellitus without complication ([place])   . Gout   . HOH (hard of hearing) bilateral  . Hypertension   . PONV (postoperative  nausea and vomiting)     Family History   family history is not on file.  Prior Rehab/Hospitalizations Has the patient had prior rehab or hospitalizations prior to admission? Yes  Has the patient had major surgery during 100 days prior to admission? No   Current Medications  Current Facility-Administered Medications:  .  acetaminophen (TYLENOL) tablet 650 mg, 650 mg, Oral, Q4H PRN **OR** acetaminophen (TYLENOL) solution 650 mg, 650 mg, Per Tube, Q4H PRN **OR** acetaminophen (TYLENOL) suppository 650 mg, 650 mg, Rectal, Q4H PRN, Emokpae, Ejiroghene E, MD .  albuterol (PROVENTIL) (2.5 MG/3ML) 0.083% nebulizer solution 2.5 mg, 2.5 mg, Nebulization, Q6H PRN, Mariel Aloe, MD .  allopurinol (ZYLOPRIM) tablet 300 mg, 300 mg, Oral, QHS, Emokpae, Ejiroghene E, MD, 300 mg at 06/16/19 2154 .  amLODipine (NORVASC) tablet 5 mg, 5 mg, Oral, q morning - 10a, Mariel Aloe, MD, 5 mg at 06/17/19 1017 .  aspirin EC tablet 81 mg, 81 mg, Oral, Daily, Biby, Sharon L, NP, 81 mg at 06/17/19 1011 .  atorvastatin (LIPITOR) tablet 20 mg, 20 mg, Oral, QHS, Emokpae, Ejiroghene E, MD, 20 mg at 06/16/19 2154 .  clopidogrel (PLAVIX) tablet 75 mg, 75 mg, Oral, Daily, Biby, Sharon L, NP, 75 mg at 06/17/19 1011 .  enoxaparin (LOVENOX) injection 40 mg, 40 mg, Subcutaneous, Q24H, Emokpae, Ejiroghene E, MD, 40 mg at 06/16/19 2154 .  famotidine (PEPCID) tablet 20 mg, 20 mg, Oral, Daily, Emokpae, Ejiroghene E, MD, 20 mg at 06/17/19 1011 .  glipiZIDE (GLUCOTROL) tablet 5 mg, 5 mg, Oral, q morning - 10a, Emokpae, Ejiroghene E, MD, 5 mg at 06/17/19 1011 .  insulin aspart (novoLOG) injection 0-5 Units, 0-5 Units, Subcutaneous, QHS, Emokpae, Ejiroghene E, MD .  insulin aspart (novoLOG) injection 0-9 Units, 0-9 Units, Subcutaneous, TID WC, Emokpae, Ejiroghene E, MD, 1 Units at 06/17/19 1229 .  loratadine (CLARITIN) tablet 10 mg, 10 mg, Oral, Daily, Emokpae, Ejiroghene E, MD, 10 mg at 06/17/19 1011 .  nystatin cream (MYCOSTATIN)  1 application, 1 application, Topical, Daily PRN, Mariel Aloe, MD .  senna-docusate (Senokot-S) tablet 1 tablet, 1 tablet, Oral, QHS PRN, Emokpae, Ejiroghene E, MD  Facility-Administered Medications Ordered in Other Encounters:  .  fentaNYL (SUBLIMAZE) injection 25-50 mcg, 25-50 mcg, Intravenous, Q5 min PRN, Lerry Liner, MD  Patients Current Diet:  Diet Order            Diet heart healthy/carb modified Room service appropriate? No; Fluid consistency: Thin  Diet effective now        Diet - low  sodium heart healthy              Precautions / Restrictions Precautions Precautions: Fall, Other (comment) Precaution Comments: HOH, seems to also have a mix of expressive difficult and confusion Restrictions Weight Bearing Restrictions: No   Has the patient had 2 or more falls or a fall with injury in the past year? Yes  Prior Activity Level Community (5-7x/wk): very Independent PTA, mowed yards, drove; was doing well until fall in yard Sept 19th  Prior Functional Level Self Care: Did the patient need help bathing, dressing, using the toilet or eating? Independent  Indoor Mobility: Did the patient need assistance with walking from room to room (with or without device)? Independent  Stairs: Did the patient need assistance with internal or external stairs (with or without device)? Independent  Functional Cognition: Did the patient need help planning regular tasks such as shopping or remembering to take medications? Independent  Home Assistive Devices / Equipment    Prior Device Use: Indicate devices/aids used by the patient prior to current illness, exacerbation or injury? NA until fall on Sept 19th then quad cane use  Current Functional Level Cognition  Arousal/Alertness: Awake/alert Overall Cognitive Status: Impaired/Different from baseline Current Attention Level: Sustained Orientation Level: Oriented to person, Oriented to place, Disoriented to time, Disoriented to  situation Following Commands: Follows one step commands inconsistently, Follows one step commands with increased time Safety/Judgement: Decreased awareness of safety, Decreased awareness of deficits General Comments: very slow processing this afternoon, almost seems like he was sundowning Attention: Focused, Sustained Focused Attention: Impaired Focused Attention Impairment: Verbal basic Sustained Attention: Impaired Sustained Attention Impairment: Verbal complex    Extremity Assessment (includes Sensation/Coordination)  Upper Extremity Assessment: LUE deficits/detail LUE Deficits / Details: birth deficit - elbow flexion , shorter extermity and wrist contracture present. pt demonstrates decrease awareness to L UE this session. pt with decrease activation of L UE  Lower Extremity Assessment: Defer to PT evaluation    ADLs  Overall ADL's : Needs assistance/impaired Eating/Feeding: Maximal assistance Grooming: Maximal assistance Grooming Details (indicate cue type and reason): pt was able to remove dentures. pt was able to brush teeth mod (A). pt was able to wash face min (A). pt was more automatic with adls.  Upper Body Bathing: Maximal assistance Lower Body Bathing: Total assistance Upper Body Dressing : Maximal assistance Lower Body Dressing: Total assistance Toilet Transfer: +2 for physical assistance, Maximal assistance Functional mobility during ADLs: +2 for physical assistance, Maximal assistance General ADL Comments: pt progresed from bed to sink (5 steps and then required sitting) pt with L knee sleeve for compression present int he room. Wife present throughout session    Mobility  Overal bed mobility: Needs Assistance Bed Mobility: Supine to Sit Supine to sit: Mod assist Sit to supine: Mod assist General bed mobility comments: Mod assistance to advance LEs to edge of bed and to elevate trunk into a seated position.    Transfers  Overall transfer level: Needs  assistance Equipment used: Rolling walker (2 wheeled) Transfers: Sit to/from Stand Sit to Stand: Mod assist Stand pivot transfers: Max assist General transfer comment: Pt performed face to face transfer with posterior bias.  He was able to perform steps away from bed toward his chair before sitting.    Ambulation / Gait / Stairs / Wheelchair Mobility  Ambulation/Gait Ambulation/Gait assistance: Max Web designer (Feet): 4 Feet Assistive device: None Gait Pattern/deviations: Shuffle, Leaning posteriorly General Gait Details: Side steps to the R from bed  to recliner chair. Gait velocity: Decreased    Posture / Balance Balance Overall balance assessment: Needs assistance Sitting-balance support: Bilateral upper extremity supported, Feet supported Sitting balance-Leahy Scale: Good Standing balance support: Bilateral upper extremity supported, During functional activity Standing balance-Leahy Scale: Poor Standing balance comment: heavy posterior lean today    Special needs/care consideration BiPAP/CPAP : no CPM :no Continuous Drip IV : no Dialysis : no        Days : no Life Vest : no Oxygen : no Special Bed : no Trach Size : no Wound Vac (area) : no      Location : no Skin: ecchymosis to BUEs                       Bowel mgmt: incontinent, last BM 10/3 Bladder mgmt: incontinent Diabetic mgmt: yes Behavioral consideration : confused Chemo/radiation : no   Previous Home Environment (from acute therapy documentation)  Lives With: Spouse Available Help at Discharge: Family Type of Home: (Lives at home- unsure of type) Additional Comments: patient not able to provide information, family not present to clarify  Discharge Living Setting Plans for Discharge Living Setting: Patient's home, Lives with (comment)(wife) Type of Home at Discharge: House Discharge Home Layout: One level(with basement) Discharge Home Access: Stairs to enter(can put in ramp if needed-per  wife) Entrance Stairs-Rails: Can reach both Entrance Stairs-Number of Steps: 2 Discharge Bathroom Shower/Tub: Walk-in shower Discharge Bathroom Toilet: Handicapped height Discharge Bathroom Accessibility: Yes How Accessible: Accessible via walker Does the patient have any problems obtaining your medications?: No  Social/Family/Support Systems Patient Roles: Spouse Contact Information: wife: [name] (home: [phone]; cell: [phone]); daugther Abigail Butts (work cell: [phone], cell: [phone]; home: [phone]) Anticipated Caregiver: wife (daugther and son-in-law) can assist at times as well Anticipated Caregiver's Contact Information: see above Ability/Limitations of Caregiver: Min A Caregiver Availability: 24/7 Discharge Plan Discussed with Primary Caregiver: Yes Is Caregiver In Agreement with Plan?: Yes Does Caregiver/Family have Issues with Lodging/Transportation while Pt is in Rehab?: No  Goals/Additional Needs Patient/Family Goal for Rehab: PT/OT: Supervision/Min A; SLP: Supervision Expected length of stay: 12-16 days Cultural Considerations: NA Dietary Needs: heart healthy, carb modified; thin liquids; room service not appropriate.  Equipment Needs: TBD Pt/Family Agrees to Admission and willing to participate: Yes Program Orientation Provided & Reviewed with Pt/Caregiver Including Roles  & Responsibilities: Yes(reviewed with pt and wife and daughter)  Barriers to Discharge: Home environment access/layout, Behavior  Barriers to Discharge Comments: steps to enter; pt impulsive in current state  Decrease burden of Care through IP rehab admission: NA  Possible need for SNF placement upon discharge: Not anticipated; wife is prepared for light physical assistance at home and does not plan on pt going to SNF. Wife states she can provide 24/7 A and can manage confusion at DC if needed.   Patient Condition: I have reviewed medical records from [facility],  spoken with MD and RN, and patient, spouse and daughter. I met with patient at the bedside for inpatient rehabilitation assessment.  Patient will benefit from ongoing PT, OT and SLP, can actively participate in 3 hours of therapy a day 5 days of the week, and can make measurable gains during the admission.  Patient will also benefit from the coordinated team approach during an Inpatient Acute Rehabilitation admission.  The patient will receive intensive therapy as well as Rehabilitation physician, nursing, social worker, and care management interventions.  Due to bladder management, bowel management, safety, skin/wound care, disease  management, medication administration, pain management and patient education the patient requires 24 hour a day rehabilitation nursing.  The patient is currently Max A with mobility and Max A to total A for basic ADLs.  Discharge setting and therapy post discharge at home with home health is anticipated.  Patient has agreed to participate in the Acute Inpatient Rehabilitation Program and will admit 06/17/2019.  Preadmission Screen Completed By:  Raechel Ache, 06/17/2019 4:43 PM ______________________________________________________________________   Discussed status with Dr. Posey Pronto on 06/17/2019 at 4:40PM and received approval for admission today.  Admission Coordinator:  Raechel Ache, OT, time 4:40PM/Date 06/17/2019   Assessment/Plan: Diagnosis: Right thalamic/internal capsule infarct  1. Does the need for close, 24 hr/day Medical supervision in concert with the patient's rehab needs make it unreasonable for this patient to be served in a less intensive setting? Yes 2. Co-Morbidities requiring supervision/potential complications: CAD with CABG 1997 maintained on aspirin, hard of hearing, CKD stage III, diabetes mellitus, hypertension, asthma. 3. Due to bladder management, safety, disease management and patient education, does the patient require 24 hr/day rehab nursing?  Yes 4. Does the patient require coordinated care of a physician, rehab nurse, PT (1-2 hrs/day, 5 days/week), OT (1-2 hrs/day, 5 days/week) and SLP (1-2 hrs/day, 5 days/week) to address physical and functional deficits in the context of the above medical diagnosis(es)? Yes Addressing deficits in the following areas: balance, endurance, locomotion, strength, transferring, bathing, dressing, toileting, cognition and psychosocial support 5. Can the patient actively participate in an intensive therapy program of at least 3 hrs of therapy 5 days a week? Yes 6. The potential for patient to make measurable gains while on inpatient rehab is excellent 7. Anticipated functional outcomes upon discharge from inpatients are: min assist PT, min assist OT, min assist SLP 8. Estimated rehab length of stay to reach the above functional goals is: 14-17 days. 9. Anticipated D/C setting: Home 10. Anticipated post D/C treatments: HH therapy and Home excercise program 11. Overall Rehab/Functional Prognosis: good  MD Signature: Delice Lesch, MD, ABPMR

## 2019-06-13 NOTE — Progress Notes (Signed)
Inpatient Rehab Admissions:  Inpatient Rehab Consult received.  I met with pt at the bedside for rehabilitation assessment. Pt very confused and does not understand why he is here in the hospital. I spoke with his wife, [name], via phone to gather information regarding PLOF and discuss rehab options. I reviewed CIR program details, expected length of stay, and anticipated level of assistance at DC. His wife would like to pursue CIR at this time and has stated she will have the recommended assist at home as needed. I will pursue CIR for this patient and will begin insurance authorization process for possible admit.   [name], OTR/L  Rehab Admissions Coordinator  [phone] 06/13/2019 12:25 PM

## 2019-06-13 NOTE — Progress Notes (Signed)
  Speech Language Pathology Treatment: Cognitive-Linquistic  Patient Details Name: [name] MRN: [id number] DOB: 12-02-1935 Today's Date: 06/13/2019 Time: 4970-2637 SLP Time Calculation (min) (ACUTE ONLY): 35 min  Assessment / Plan / Recommendation Clinical Impression  Pt was seen for cognitive-linguistic treatment and was cooperative throughout the session. His right hearing aid has been brought to the hospital and his left hearing aid has been fixed. Both aids were in place during the session and pt's performance was notably improved compared to that which was noted yesterday. His motor speech skills are now within normal limits without evidence of dysarthria. He required cues for orientation to place, time, and situation. He exhibited some difficulty with recall of this information following re-orientation. He achieved 100% accuracy with confrontational naming and he provided 5 items per concrete category during divergent naming. Pt participated in conversation with this SLP and his wife. Some occasional tangential tendencies were noted but pt was easily redirected. He answered simple yes/no questions with 100% accuracy and achieved 60% accuracy with complex yes/no questions increasing to 80% with re-phrasing. He required mod cues for focused and sustained attention. Mod-max cues were required for verbal sequencing. Pt's wife was educated regarding his cognitive-linguistic impairments and the plan of care. She verbalized understanding as well as agreement. SLP will continue to follow pt to target his cognitive-linguistic skills.    HPI HPI: Pt is an 83 y.o. male with medical history significant for hypertension, diabetes mellitus, asthma, coronary artery disease, who was brought to the ED by EMS secondary to reports of confusion and garbled speech. MRI of the brain revealed small acute infarcts at the medial right thalamus and right internal capsule.       SLP Plan  Goals updated        Recommendations                   Follow up Recommendations: 24 hour supervision/assistance;Inpatient Rehab SLP Visit Diagnosis: Cognitive communication deficit [phone]) Plan: Goals updated       Vivia Rosenburg I. Hardin Negus, [place], [place] Office number [phone] Pager [place] 06/13/2019, 4:37 PM

## 2019-06-13 NOTE — Progress Notes (Signed)
PROGRESS NOTE    [name]  ACZ:[id number] DOB: [birth year] DOA: 06/10/2019 PCP: [name], MD   Brief Narrative: [name] is a 83 y.o. male with medical history significant forhypertension, diabetes mellitus, asthma, coronary artery disease. Patient presented secondary to confusion and garbled speech and found to have an acute CVA.   Assessment & Plan:   Principal Problem:   Speech abnormality Active Problems:   CVA (cerebral vascular accident) ([place])   Essential hypertension   CKD (chronic kidney disease), stage III   Acute stroke MRI significant for R thalamic/internal capsule infarct CTA significant for 80% left proximal ICA stenosis and 80%/60% right distal/proximal ICA stenosi respectively; carotid dopplers with 60-79% stenosis bilaterally with non-visualized bilateral vertebral arteries. LDL of 66. Hemoglobin A1C of 5.8%. Neurology on board and recommending aspirin for 3 months in addition to Plavix indefinitely. Transthoracic Echocardiogram significant for no thrombus. -Continue aspirin, Plavix, Lipitor -PT/OT recommending CIR  Metabolic encephalopathy Home Xanax held. Seems to be resolved.  Essential hypertension Holding home regimen currently secondary to acute stroke  Diabetes mellitus, type 2 -Continue SSI  Abnormal CT Thickened esophagus seen. Likely gastritis. Will need GI follow-up.  Asthma Stable.  CAD -Continue aspirin and lipitor  CKD stage III Stable.   DVT prophylaxis: Lovenox Code Status:   Code Status: Full Code Family Communication: None at bedside. Tried calling wife. Disposition Plan: Discharge to CIR. Medically stable   Consultants:   Neurology  CIR  Procedures:   Transthoracic Echocardiogram (06/12/2019) IMPRESSIONS    1. Left ventricular ejection fraction, by visual estimation, is 60 to 65%. The left ventricle has normal function. Normal left ventricular size. There is no left ventricular  hypertrophy.  2. Left ventricular diastolic Doppler parameters are consistent with impaired relaxation pattern of LV diastolic filling.  3. Global right ventricle has normal systolic function.The right ventricular size is normal. No increase in right ventricular wall thickness.  4. Left atrial size was normal.  5. Right atrial size was normal.  6. Mild mitral annular calcification.  7. The mitral valve is normal in structure. Trace mitral valve regurgitation. No evidence of mitral stenosis.  8. The tricuspid valve is not well visualized. Tricuspid valve regurgitation is trivial.  9. The aortic valve was not well visualized Aortic valve regurgitation was not visualized by color flow Doppler. 10. The pulmonic valve was not well visualized. Pulmonic valve regurgitation is not visualized by color flow Doppler. 11. Technically difficult; definity used; normal LV systolic function; grade 1 diastolic dysfunction.  FINDINGS  Left Ventricle: Left ventricular ejection fraction, by visual estimation, is 60 to 65%. The left ventricle has normal function. There is no left ventricular hypertrophy. Normal left ventricular size. Spectral Doppler shows Left ventricular diastolic  Doppler parameters are consistent with impaired relaxation pattern of LV diastolic filling.  Right Ventricle: The right ventricular size is normal. Global RV systolic function is has normal systolic function.  Left Atrium: Left atrial size was normal in size.  Right Atrium: Right atrial size was normal in size  Pericardium: There is no evidence of pericardial effusion.  Mitral Valve: The mitral valve is normal in structure. Mild mitral annular calcification. No evidence of mitral valve stenosis by observation. Trace mitral valve regurgitation.  Tricuspid Valve: The tricuspid valve is not well visualized. Tricuspid valve regurgitation is trivial by color flow Doppler.  Aortic Valve: The aortic valve was not well  visualized. Aortic valve regurgitation was not visualized by color flow Doppler.  Pulmonic Valve: The pulmonic  valve was not well visualized. Pulmonic valve regurgitation is not visualized by color flow Doppler.  Aorta: The aortic root, ascending aorta and aortic arch are all structurally normal, with no evidence of dilitation or obstruction.  Venous: The inferior vena cava was not well visualized.  IAS/Shunts: No atrial level shunt detected by color flow Doppler. No ventricular septal defect is seen or detected. There is no evidence of an atrial septal defect.  Antimicrobials:  None    Subjective: Patient stating he needs to go home to mow his lawn  Objective: Vitals:   06/13/19 0330 06/13/19 0834 06/13/19 0850 06/13/19 1205  BP: (!) 188/64 (!) 205/68 (!) 201/72 (!) 192/68  Pulse: 76 74  68  Resp: 18 20 15 20   Temp: 98.3 F (36.8 C) 98.3 F (36.8 C)  98.5 F (36.9 C)  TempSrc: Oral Oral  Oral  SpO2: 99% 97%  97%  Weight:      Height:        Intake/Output Summary (Last 24 hours) at 06/13/2019 1344 Last data filed at 06/13/2019 0851 Gross per 24 hour  Intake --  Output 1150 ml  Net -1150 ml   Filed Weights   06/10/19 1356 06/10/19 2109  Weight: 80 kg 84.6 kg    Examination:  General exam: Appears calm and comfortable Respiratory system: Clear to auscultation. Respiratory effort normal. Cardiovascular system: S1 & S2 heard, RRR. No murmurs, rubs, gallops or clicks. Gastrointestinal system: Abdomen is nondistended, soft and nontender. No organomegaly or masses felt. Normal bowel sounds heard. Central nervous system: Alert. Patient disoriented Extremities: No edema. No calf tenderness Skin: No cyanosis. No rashes Psychiatry: Judgement and insight appear impaired    Data Reviewed: I have personally reviewed following labs and imaging studies  CBC: Recent Labs  Lab 06/10/19 1441 06/12/19 0411  WBC 8.2 7.5  NEUTROABS 5.9  --   HGB 10.2* 10.3*  HCT 32.9*  30.4*  MCV 92.4 88.4  PLT 319 562   Basic Metabolic Panel: Recent Labs  Lab 06/10/19 1441 06/12/19 0411  NA 132* 137  K 3.7 3.8  CL 100 106  CO2 22 23  GLUCOSE 215* 85  BUN 36* 22  CREATININE 1.98* 1.76*  CALCIUM 8.9 8.9   GFR: Estimated Creatinine Clearance: 34.6 mL/min (A) (by C-G formula based on SCr of 1.76 mg/dL (H)). Liver Function Tests: No results for input(s): AST, ALT, ALKPHOS, BILITOT, PROT, ALBUMIN in the last 168 hours. No results for input(s): LIPASE, AMYLASE in the last 168 hours. No results for input(s): AMMONIA in the last 168 hours. Coagulation Profile: Recent Labs  Lab 06/10/19 1441  INR 1.1   Cardiac Enzymes: No results for input(s): CKTOTAL, CKMB, CKMBINDEX, TROPONINI in the last 168 hours. BNP (last 3 results) No results for input(s): PROBNP in the last 8760 hours. HbA1C: Recent Labs    06/11/19 0851  HGBA1C 5.8*   CBG: Recent Labs  Lab 06/12/19 1629 06/12/19 2107 06/13/19 0600 06/13/19 1207 06/13/19 1300  GLUCAP 109* 107* 93 54* 74   Lipid Profile: Recent Labs    06/11/19 0851  CHOL 123  HDL 35*  LDLCALC 66  TRIG 110  CHOLHDL 3.5   Thyroid Function Tests: No results for input(s): TSH, T4TOTAL, FREET4, T3FREE, THYROIDAB in the last 72 hours. Anemia Panel: No results for input(s): VITAMINB12, FOLATE, FERRITIN, TIBC, IRON, RETICCTPCT in the last 72 hours. Sepsis Labs: Recent Labs  Lab 06/10/19 1441 06/10/19 1632  LATICACIDVEN 1.5 0.9    Recent Results (from  the past 240 hour(s))  SARS Coronavirus 2 [facility] order, Performed in [facility] hospital lab) Nasopharyngeal Nasopharyngeal Swab     Status: None   Collection Time: 06/10/19  6:29 PM   Specimen: Nasopharyngeal Swab  Result Value Ref Range Status   SARS Coronavirus 2 NEGATIVE NEGATIVE Final    Comment: (NOTE) If result is NEGATIVE SARS-CoV-2 target nucleic acids are NOT DETECTED. The SARS-CoV-2 RNA is generally detectable in upper and lower  respiratory  specimens during the acute phase of infection. The lowest  concentration of SARS-CoV-2 viral copies this assay can detect is 250  copies / mL. A negative result does not preclude SARS-CoV-2 infection  and should not be used as the sole basis for treatment or other  patient management decisions.  A negative result may occur with  improper specimen collection / handling, submission of specimen other  than nasopharyngeal swab, presence of viral mutation(s) within the  areas targeted by this assay, and inadequate number of viral copies  (<250 copies / mL). A negative result must be combined with clinical  observations, patient history, and epidemiological information. If result is POSITIVE SARS-CoV-2 target nucleic acids are DETECTED. The SARS-CoV-2 RNA is generally detectable in upper and lower  respiratory specimens dur ing the acute phase of infection.  Positive  results are indicative of active infection with SARS-CoV-2.  Clinical  correlation with patient history and other diagnostic information is  necessary to determine patient infection status.  Positive results do  not rule out bacterial infection or co-infection with other viruses. If result is PRESUMPTIVE POSTIVE SARS-CoV-2 nucleic acids MAY BE PRESENT.   A presumptive positive result was obtained on the submitted specimen  and confirmed on repeat testing.  While 2019 novel coronavirus  (SARS-CoV-2) nucleic acids may be present in the submitted sample  additional confirmatory testing may be necessary for epidemiological  and / or clinical management purposes  to differentiate between  SARS-CoV-2 and other Sarbecovirus currently known to infect humans.  If clinically indicated additional testing with an alternate test  methodology [phone]) is advised. The SARS-CoV-2 RNA is generally  detectable in upper and lower respiratory sp ecimens during the acute  phase of infection. The expected result is Negative. Fact Sheet for  Patients:  StrictlyIdeas.no Fact Sheet for Healthcare Providers: BankingDealers.co.za This test is not yet approved or cleared by the [place] FDA and has been authorized for detection and/or diagnosis of SARS-CoV-2 by FDA under an Emergency Use Authorization (EUA).  This EUA will remain in effect (meaning this test can be used) for the duration of the COVID-19 declaration under Section 564(b)(1) of the Act, 21 U.S.C. section 360bbb-3(b)(1), unless the authorization is terminated or revoked sooner. Performed at [facility], [address] [place], [place] [address]          Radiology Studies: Vas US Carotid  Result Date: 06/12/2019 Carotid Arterial Duplex Study Indications:   CVA and Carotid artery disease. Risk Factors:  Hypertension, Diabetes. Other Factors: CTA neck right ICA 60% left ICA 80% stenosis. Limitations    Today's exam was limited due to the patient's inability or                unwillingness to cooperate. Performing Technologist: June Leap RDMS, RVT  Examination Guidelines: A complete evaluation includes B-mode imaging, spectral Doppler, color Doppler, and power Doppler as needed of all accessible portions of each vessel. Bilateral testing is considered an integral part of a complete examination. Limited examinations for reoccurring indications  may be performed as noted.  Right Carotid Findings: +----------+-------+-------+--------+---------------------------------+--------+             PSV     EDV     Stenosis Plaque Description                Comments              cm/s    cm/s                                                         +----------+-------+-------+--------+---------------------------------+--------+  CCA Prox   76      12                                                           +----------+-------+-------+--------+---------------------------------+--------+  CCA Distal 84      6                heterogenous                                 +----------+-------+-------+--------+---------------------------------+--------+  ICA Prox   406     70      60-79%   heterogenous, calcific and                                                       irregular                                   +----------+-------+-------+--------+---------------------------------+--------+  ICA Distal 75      11                                                           +----------+-------+-------+--------+---------------------------------+--------+  ECA        423     26      >50%     heterogenous, calcific and                                                       irregular                                   +----------+-------+-------+--------+---------------------------------+--------+ +---------+--------+--------+------+  Vertebral PSV cm/s EDV cm/s Absent  +---------+--------+--------+------+  Left Carotid Findings: +----------+-------+-------+--------+---------------------------------+--------+             PSV     EDV     Stenosis Plaque Description  Comments              cm/s    cm/s                                                         +----------+-------+-------+--------+---------------------------------+--------+  CCA Prox   90      16                                                           +----------+-------+-------+--------+---------------------------------+--------+  CCA Distal 103     18                                                           +----------+-------+-------+--------+---------------------------------+--------+  ICA Prox   271     60      60-79%   heterogenous, calcific and                                                       irregular                                   +----------+-------+-------+--------+---------------------------------+--------+  ICA Distal 136     19                                                           +----------+-------+-------+--------+---------------------------------+--------+  ECA         170     5                                                            +----------+-------+-------+--------+---------------------------------+--------+ +---------+--------+--------+------+  Vertebral PSV cm/s EDV cm/s Absent  +---------+--------+--------+------+  Summary: Right Carotid: Velocities in the right ICA are consistent with a 60-79%                stenosis. The ECA appears >50% stenosed. Left Carotid: Velocities in the left ICA are consistent with a 60-79% stenosis. Vertebrals: Bilateral vertebral arteries were not visualized. *See table(s) above for measurements and observations.  Electronically signed by Antony Contras MD on 06/12/2019 at 12:39:16 PM.    Final         Scheduled Meds:  allopurinol  300 mg Oral QHS   aspirin EC  81 mg Oral Daily   atorvastatin  20 mg Oral QHS   clopidogrel  75 mg Oral  Daily   enoxaparin (LOVENOX) injection  40 mg Subcutaneous Q24H   famotidine  20 mg Oral Daily   glipiZIDE  5 mg Oral q morning - 10a   insulin aspart  0-5 Units Subcutaneous QHS   insulin aspart  0-9 Units Subcutaneous TID WC   loratadine  10 mg Oral Daily   Continuous Infusions:    LOS: 2 days     Cordelia Poche, MD [facility] 06/13/2019, 1:44 PM  If 7PM-7AM, please contact night-coverage [email or web address]

## 2019-06-13 NOTE — Progress Notes (Signed)
Went to telemetry to review strip. Irregularities appear to be artifact. Does not appear to be consistent with atrial flutter. Did not see evidence of V-tach on telemetry upon my review. Discussed with nurse.  Cordelia Poche, MD [facility] 06/13/2019, 5:24 PM

## 2019-06-13 NOTE — Progress Notes (Signed)
Physical Therapy Treatment Patient Details Name: [name] MRN: [id number] DOB: March 10, 1936 Today's Date: 06/13/2019    History of Present Illness 83yo male brought to the ED due to garbled speech and confusion/AMS. 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 Not a tpa candidate per neuro. PMH CAD, DM, gout, HOH, HTN, anxiety, CABG, hx R TKR    PT Comments    Patient received in bed, now with hearing aide so able to communicate with therapist better, but remains confused, A&Ox1 and often responding to questions with off topic answers or not at all. Seems to require a bit of extra time for processing, benefits from hand gestures during mobility. Mobility improved today, able to perform bed mobility with min guard and VC/TC, however did require ModA for sit to stand with RW and able to pivot to chair with RW/MinA for balance. Progressed gait training and able to ambulate approximately 22f with RW and initially MinA for balance, increased to [place] fatigue and presumably L knee pain increased given increasingly antalgic gait pattern. He was left up in the chair with all needs met, chair alarm active this afternoon. Continue to recommend intensive therapies in the CIR setting due to significant loss of mobility and independence following acute event.     Follow Up Recommendations  CIR     Equipment Recommendations  Rolling walker with 5" wheels;3in1 (PT)    Recommendations for Other Services Rehab consult     Precautions / Restrictions Precautions Precautions: Fall;Other (comment) Precaution Comments: HOH, seems to also have a mix of expressive difficult and confusion Restrictions Weight Bearing Restrictions: No    Mobility  Bed Mobility Overal bed mobility: Needs Assistance Bed Mobility: Supine to Sit     Supine to sit: Min guard     General bed mobility comments: min guard, extended time, VC and  TC to scoot hips around to EOB fully so feet are on floor  Transfers Overall transfer level: Needs assistance Equipment used: Rolling walker (2 wheeled) Transfers: Sit to/from SOmnicareSit to Stand: Mod assist Stand pivot transfers: Min assist       General transfer comment: ModAx1 and standby of 1 for safety and line management with sit to stand ; able to pivot to chair with very short steps and MinA for RW management and balance  Ambulation/Gait Ambulation/Gait assistance: Min assist;Mod assist Gait Distance (Feet): 10 Feet Assistive device: Rolling walker (2 wheeled) Gait Pattern/deviations: Decreased step length - right;Decreased step length - left;Decreased stride length;Antalgic;Narrow base of support;Trunk flexed Gait velocity: Decreased   General Gait Details: antalgic gait pattern with narrow BOS, initially MInA for RW management and balance, increased to [place] to left lean as fatigue and pain increased. Knee brace on L and patient rubbling L knee after gait.   Stairs             Wheelchair Mobility    Modified Rankin (Stroke Patients Only)       Balance Overall balance assessment: Needs assistance Sitting-balance support: Bilateral upper extremity supported;Feet supported Sitting balance-Leahy Scale: Good     Standing balance support: Bilateral upper extremity supported;During functional activity Standing balance-Leahy Scale: Poor Standing balance comment: L lean as fatigue increass, RW and MinA-ModA for balance                            Cognition Arousal/Alertness: Awake/alert  Behavior During Therapy: Impulsive Overall Cognitive Status: Impaired/Different from baseline Area of Impairment: Attention;Following commands;Awareness;Orientation;Memory;Safety/judgement;Problem solving                 Orientation Level: Disoriented to;Place;Time;Situation Current Attention Level: Sustained Memory: Decreased  short-term memory Following Commands: Follows one step commands with increased time;Follows multi-step commands inconsistently Safety/Judgement: Decreased awareness of deficits;Decreased awareness of safety Awareness: Intellectual Problem Solving: Slow processing;Difficulty sequencing;Decreased initiation;Requires verbal cues;Requires tactile cues        Exercises      General Comments General comments (skin integrity, edema, etc.): baseline L knee pain      Pertinent Vitals/Pain Pain Assessment: Faces Faces Pain Scale: Hurts little more Pain Location: L knee with gait Pain Descriptors / Indicators: Aching;Discomfort Pain Intervention(s): Limited activity within patient's tolerance;Monitored during session;Repositioned    Home Living                      Prior Function            PT Goals (current goals can now be found in the care plan section) Acute Rehab PT Goals Patient Stated Goal: talking about how peopel go to the hospital too soon and too often PT Goal Formulation: With patient/family Progress towards PT goals: Progressing toward goals    Frequency    Min 4X/week      PT Plan Current plan remains appropriate    Co-evaluation              AM-PAC PT "6 Clicks" Mobility   Outcome Measure  Help needed turning from your back to your side while in a flat bed without using bedrails?: A Little Help needed moving from lying on your back to sitting on the side of a flat bed without using bedrails?: A Little Help needed moving to and from a bed to a chair (including a wheelchair)?: A Little Help needed standing up from a chair using your arms (e.g., wheelchair or bedside chair)?: A Lot Help needed to walk in hospital room?: A Lot Help needed climbing 3-5 steps with a railing? : Total 6 Click Score: 14    End of Session Equipment Utilized During Treatment: Gait belt Activity Tolerance: Patient tolerated treatment well Patient left: in chair;with  call bell/phone within reach;with chair alarm set   PT Visit Diagnosis: Unsteadiness on feet (R26.81);Muscle weakness (generalized) (M62.81);Difficulty in walking, not elsewhere classified (R26.2);Other symptoms and signs involving the nervous system (R29.898)     Time: 3578-9784 PT Time Calculation (min) (ACUTE ONLY): 25 min  Charges:  $Gait Training: 8-22 mins $Therapeutic Activity: 8-22 mins                     Deniece Ree PT, DPT, CBIS  Supplemental Physical Therapist [place]    Pager [phone] Acute Rehab Office [phone]

## 2019-06-14 ENCOUNTER — Ambulatory Visit: Payer: Medicare Other | Admitting: Urology

## 2019-06-14 LAB — GLUCOSE, CAPILLARY
Glucose-Capillary: 107 mg/dL — ABNORMAL HIGH (ref 70–99)
Glucose-Capillary: 120 mg/dL — ABNORMAL HIGH (ref 70–99)
Glucose-Capillary: 190 mg/dL — ABNORMAL HIGH (ref 70–99)
Glucose-Capillary: 142 mg/dL — ABNORMAL HIGH (ref 70–99)
Glucose-Capillary: 176 mg/dL — ABNORMAL HIGH (ref 70–99)

## 2019-06-14 NOTE — Progress Notes (Signed)
PROGRESS NOTE    Lary Eckardt Hauth  TZG:[id number] DOB: 1936-08-11 DOA: 06/10/2019 PCP: [name], MD   Brief Narrative: Mervil Wacker Chamorro is a 83 y.o. male with medical history significant forhypertension, diabetes mellitus, asthma, coronary artery disease. Patient presented secondary to confusion and garbled speech and found to have an acute CVA.   Assessment & Plan:   Principal Problem:   Speech abnormality Active Problems:   CVA (cerebral vascular accident) ([place])   Essential hypertension   CKD (chronic kidney disease), stage III   Acute stroke MRI significant for R thalamic/internal capsule infarct CTA significant for 80% left proximal ICA stenosis and 80%/60% right distal/proximal ICA stenosi respectively; carotid dopplers with 60-79% stenosis bilaterally with non-visualized bilateral vertebral arteries. LDL of 66. Hemoglobin A1C of 5.8%. Neurology on board and recommending aspirin for 3 months in addition to Plavix indefinitely. Transthoracic Echocardiogram significant for no thrombus. -Continue aspirin, Plavix, Lipitor -PT/OT recommending CIR  Metabolic encephalopathy Home Xanax held. Patient significantly improved today. Some of this is likely secondary to patient's very poor hearing; hearing aid is now available to the patient.  Essential hypertension Holding home regimen currently secondary to acute stroke  Diabetes mellitus, type 2 -Continue SSI  Abnormal CT Thickened esophagus seen. Likely gastritis. Will need GI follow-up.  Asthma Stable.  CAD -Continue aspirin and lipitor  CKD stage III Stable.   DVT prophylaxis: Lovenox Code Status:   Code Status: Full Code Family Communication: None at bedside Disposition Plan: Discharge to CIR. Medically stable   Consultants:   Neurology  CIR  Procedures:   Transthoracic Echocardiogram (06/12/2019) IMPRESSIONS    1. Left ventricular ejection fraction, by visual estimation, is 60 to 65%. The  left ventricle has normal function. Normal left ventricular size. There is no left ventricular hypertrophy.  2. Left ventricular diastolic Doppler parameters are consistent with impaired relaxation pattern of LV diastolic filling.  3. Global right ventricle has normal systolic function.The right ventricular size is normal. No increase in right ventricular wall thickness.  4. Left atrial size was normal.  5. Right atrial size was normal.  6. Mild mitral annular calcification.  7. The mitral valve is normal in structure. Trace mitral valve regurgitation. No evidence of mitral stenosis.  8. The tricuspid valve is not well visualized. Tricuspid valve regurgitation is trivial.  9. The aortic valve was not well visualized Aortic valve regurgitation was not visualized by color flow Doppler. 10. The pulmonic valve was not well visualized. Pulmonic valve regurgitation is not visualized by color flow Doppler. 11. Technically difficult; definity used; normal LV systolic function; grade 1 diastolic dysfunction.  FINDINGS  Left Ventricle: Left ventricular ejection fraction, by visual estimation, is 60 to 65%. The left ventricle has normal function. There is no left ventricular hypertrophy. Normal left ventricular size. Spectral Doppler shows Left ventricular diastolic  Doppler parameters are consistent with impaired relaxation pattern of LV diastolic filling.  Right Ventricle: The right ventricular size is normal. Global RV systolic function is has normal systolic function.  Left Atrium: Left atrial size was normal in size.  Right Atrium: Right atrial size was normal in size  Pericardium: There is no evidence of pericardial effusion.  Mitral Valve: The mitral valve is normal in structure. Mild mitral annular calcification. No evidence of mitral valve stenosis by observation. Trace mitral valve regurgitation.  Tricuspid Valve: The tricuspid valve is not well visualized. Tricuspid valve  regurgitation is trivial by color flow Doppler.  Aortic Valve: The aortic valve was not well  visualized. Aortic valve regurgitation was not visualized by color flow Doppler.  Pulmonic Valve: The pulmonic valve was not well visualized. Pulmonic valve regurgitation is not visualized by color flow Doppler.  Aorta: The aortic root, ascending aorta and aortic arch are all structurally normal, with no evidence of dilitation or obstruction.  Venous: The inferior vena cava was not well visualized.  IAS/Shunts: No atrial level shunt detected by color flow Doppler. No ventricular septal defect is seen or detected. There is no evidence of an atrial septal defect.  Antimicrobials:  None    Subjective: Patient reports no issues. Wants to take a shower.  Objective: Vitals:   06/14/19 0442 06/14/19 0500 06/14/19 0600 06/14/19 0743  BP: (!) 183/72   (!) 177/87  Pulse: 75 (!) 58 69 75  Resp: 17 15 18 15   Temp: 97.8 F (36.6 C)   98.1 F (36.7 C)  TempSrc: Oral   Oral  SpO2: 95% 97% 97% 96%  Weight:      Height:        Intake/Output Summary (Last 24 hours) at 06/14/2019 1032 Last data filed at 06/13/2019 2040 Gross per 24 hour  Intake -  Output 400 ml  Net -400 ml   Filed Weights   06/10/19 1356 06/10/19 2109  Weight: 80 kg 84.6 kg    Examination:  General exam: Appears calm and comfortable Respiratory system: Clear to auscultation. Respiratory effort normal. Cardiovascular system: S1 & S2 heard, RRR. No murmurs, rubs, gallops or clicks. Gastrointestinal system: Abdomen is nondistended, soft and nontender. No organomegaly or masses felt. Normal bowel sounds heard. Central nervous system: Alert and oriented to person, place. Extremities: No LE edema. No calf tenderness. Left hand contracture with some edema Skin: No cyanosis. No rashes    Data Reviewed: I have personally reviewed following labs and imaging studies  CBC: Recent Labs  Lab 06/10/19 1441 06/12/19 0411   WBC 8.2 7.5  NEUTROABS 5.9  --   HGB 10.2* 10.3*  HCT 32.9* 30.4*  MCV 92.4 88.4  PLT 319 449   Basic Metabolic Panel: Recent Labs  Lab 06/10/19 1441 06/12/19 0411  NA 132* 137  K 3.7 3.8  CL 100 106  CO2 22 23  GLUCOSE 215* 85  BUN 36* 22  CREATININE 1.98* 1.76*  CALCIUM 8.9 8.9   GFR: Estimated Creatinine Clearance: 34.6 mL/min (A) (by C-G formula based on SCr of 1.76 mg/dL (H)). Liver Function Tests: No results for input(s): AST, ALT, ALKPHOS, BILITOT, PROT, ALBUMIN in the last 168 hours. No results for input(s): LIPASE, AMYLASE in the last 168 hours. No results for input(s): AMMONIA in the last 168 hours. Coagulation Profile: Recent Labs  Lab 06/10/19 1441  INR 1.1   Cardiac Enzymes: No results for input(s): CKTOTAL, CKMB, CKMBINDEX, TROPONINI in the last 168 hours. BNP (last 3 results) No results for input(s): PROBNP in the last 8760 hours. HbA1C: No results for input(s): HGBA1C in the last 72 hours. CBG: Recent Labs  Lab 06/13/19 1207 06/13/19 1300 06/13/19 1507 06/13/19 2123 06/14/19 0740  GLUCAP 54* 74 110* 149* 120*   Lipid Profile: No results for input(s): CHOL, HDL, LDLCALC, TRIG, CHOLHDL, LDLDIRECT in the last 72 hours. Thyroid Function Tests: No results for input(s): TSH, T4TOTAL, FREET4, T3FREE, THYROIDAB in the last 72 hours. Anemia Panel: No results for input(s): VITAMINB12, FOLATE, FERRITIN, TIBC, IRON, RETICCTPCT in the last 72 hours. Sepsis Labs: Recent Labs  Lab 06/10/19 1441 06/10/19 1632  LATICACIDVEN 1.5 0.9  Recent Results (from the past 240 hour(s))  SARS Coronavirus 2 [facility] order, Performed in [facility] hospital lab) Nasopharyngeal Nasopharyngeal Swab     Status: None   Collection Time: 06/10/19  6:29 PM   Specimen: Nasopharyngeal Swab  Result Value Ref Range Status   SARS Coronavirus 2 NEGATIVE NEGATIVE Final    Comment: (NOTE) If result is NEGATIVE SARS-CoV-2 target nucleic acids are NOT DETECTED. The  SARS-CoV-2 RNA is generally detectable in upper and lower  respiratory specimens during the acute phase of infection. The lowest  concentration of SARS-CoV-2 viral copies this assay can detect is 250  copies / mL. A negative result does not preclude SARS-CoV-2 infection  and should not be used as the sole basis for treatment or other  patient management decisions.  A negative result may occur with  improper specimen collection / handling, submission of specimen other  than nasopharyngeal swab, presence of viral mutation(s) within the  areas targeted by this assay, and inadequate number of viral copies  (<250 copies / mL). A negative result must be combined with clinical  observations, patient history, and epidemiological information. If result is POSITIVE SARS-CoV-2 target nucleic acids are DETECTED. The SARS-CoV-2 RNA is generally detectable in upper and lower  respiratory specimens dur ing the acute phase of infection.  Positive  results are indicative of active infection with SARS-CoV-2.  Clinical  correlation with patient history and other diagnostic information is  necessary to determine patient infection status.  Positive results do  not rule out bacterial infection or co-infection with other viruses. If result is PRESUMPTIVE POSTIVE SARS-CoV-2 nucleic acids MAY BE PRESENT.   A presumptive positive result was obtained on the submitted specimen  and confirmed on repeat testing.  While 2019 novel coronavirus  (SARS-CoV-2) nucleic acids may be present in the submitted sample  additional confirmatory testing may be necessary for epidemiological  and / or clinical management purposes  to differentiate between  SARS-CoV-2 and other Sarbecovirus currently known to infect humans.  If clinically indicated additional testing with an alternate test  methodology [phone]) is advised. The SARS-CoV-2 RNA is generally  detectable in upper and lower respiratory sp ecimens during the acute   phase of infection. The expected result is Negative. Fact Sheet for Patients:  StrictlyIdeas.no Fact Sheet for Healthcare Providers: BankingDealers.co.za This test is not yet approved or cleared by the [place] FDA and has been authorized for detection and/or diagnosis of SARS-CoV-2 by FDA under an Emergency Use Authorization (EUA).  This EUA will remain in effect (meaning this test can be used) for the duration of the COVID-19 declaration under Section 564(b)(1) of the Act, 21 U.S.C. section 360bbb-3(b)(1), unless the authorization is terminated or revoked sooner. Performed at [facility], [address], [place], [place] [address]          Radiology Studies: No results found.      Scheduled Meds: . allopurinol  300 mg Oral QHS  . aspirin EC  81 mg Oral Daily  . atorvastatin  20 mg Oral QHS  . clopidogrel  75 mg Oral Daily  . enoxaparin (LOVENOX) injection  40 mg Subcutaneous Q24H  . famotidine  20 mg Oral Daily  . glipiZIDE  5 mg Oral q morning - 10a  . insulin aspart  0-5 Units Subcutaneous QHS  . insulin aspart  0-9 Units Subcutaneous TID WC  . loratadine  10 mg Oral Daily   Continuous Infusions:    LOS: 3 days  Cordelia Poche, MD [facility] 06/14/2019, 10:32 AM  If 7PM-7AM, please contact night-coverage [email or web address]

## 2019-06-14 NOTE — TOC Progression Note (Signed)
Transition of Care [facility]) - Progression Note    Patient Details  Name: [name] MRN: [id number] Date of Birth: June 10, 1936  Transition of Care [facility]) CM/SW Contact  Pollie Friar, RN Phone Number: 06/14/2019, 3:53 PM  Clinical Narrative:    D/c disposition has changed to CIR. Currently awaiting authorization through [facility].  TOC following.   Expected Discharge Plan: [place] Barriers to Discharge: Continued Medical Work up  Expected Discharge Plan and Services Expected Discharge Plan: [place]   Discharge Planning Services: CM Consult Post Acute Care Choice: Home Health   Expected Discharge Date: 06/12/19                           [facility] Agency: [place] ([facility])         Social Determinants of Health (SDOH) Interventions    Readmission Risk Interventions No flowsheet data found.

## 2019-06-14 NOTE — TOC Initial Note (Signed)
Transition of Care [facility]) - Initial/Assessment Note    Patient Details  Name: [name] MRN: [id number] Date of Birth: [birth year]  Transition of Care [facility]) CM/SW Contact:    Pollie Friar, RN Phone Number: 06/14/2019, 3:51 PM  Clinical Narrative:                 CM spoke to patients wife and they have used [facility] in the past and they would like to use them again.  Pts address: [phone] in [place]. CM following for further d/c needs.   Expected Discharge Plan: [place] Barriers to Discharge: Continued Medical Work up   Patient Goals and CMS Choice   CMS Medicare.gov Compare Post Acute Care list provided to:: Patient Represenative (must comment) Choice offered to / list presented to : Spouse  Expected Discharge Plan and Services Expected Discharge Plan: [place]   Discharge Planning Services: CM Consult Post Acute Care Choice: Home Health   Expected Discharge Date: 06/12/19                           [facility] Agency: [place] ([place])        Prior Living Arrangements/Services   Lives with:: Spouse Patient language and need for interpreter reviewed:: Yes(no needs)        Need for Family Participation in Patient Care: Yes (Comment) Care giver support system in place?: Yes (comment)(family able to provide 24 hour supervision)   Criminal Activity/Legal Involvement Pertinent to Current Situation/Hospitalization: No - Comment as needed  Activities of Daily Living      Permission Sought/Granted                  Emotional Assessment Appearance:: Appears stated age            Admission diagnosis:  Slurred speech [R47.81] Speech abnormality [R47.9] Patient Active Problem List   Diagnosis Date Noted  . Essential hypertension 06/12/2019  . CKD (chronic kidney disease), stage III 06/12/2019  . CVA (cerebral vascular accident) ([place]) 06/11/2019  . Speech abnormality 06/10/2019   PCP:  [name], MD Pharmacy:   [facility], [place] [address] Phone: [phone] Fax: [phone]     Social Determinants of Health (SDOH) Interventions    Readmission Risk Interventions No flowsheet data found.

## 2019-06-14 NOTE — Progress Notes (Signed)
Inpatient Rehabilitation-Admissions Coordinator   Notified of option for peer to peer for CIR determination. Will set this up on Monday, 10/5.   Please call if questions.   [name], OTR/L  Rehab Admissions Coordinator  [phone] 06/14/2019 4:07 PM

## 2019-06-14 NOTE — Progress Notes (Signed)
PT Cancellation Note  Patient Details Name: [name] MRN: [id number] DOB: 13-Jun-1936   Cancelled Treatment:    Reason Eval/Treat Not Completed: Other (comment)attempted to work with patient, nursing staff working with patient and requesting PT come back later. Will attempt to return if time/schedule allow.    Deniece Ree PT, DPT, CBIS  Supplemental Physical Therapist [facility]    Pager [phone] Acute Rehab Office [phone]

## 2019-06-14 NOTE — Progress Notes (Signed)
Physical Therapy Treatment Patient Details Name: [name] MRN: [id number] DOB: 03/22/1936 Today's Date: 06/14/2019    History of Present Illness 83yo male brought to the ED due to garbled speech and confusion/AMS. 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 Not a tpa candidate per neuro. PMH CAD, DM, gout, HOH, HTN, anxiety, CABG, hx R TKR    PT Comments    Patient received in bed, wife present today and EXTREMELY talkative with very tangential speech herself, confirms that patient does have baseline knee pain. Patient with increased difficulty with mobility today, required ModA to initiate bed mobility which faded to Min guard as this continued. Required MaxAx2 for sit to stand with RW, opted to use stedy for pivot to [facility] due to posterior lean and poor safety when stepping today. Pivoted to [facility] with totalA in stedy, patient spent extensive and overly long amount of time (20-25 minutes) on toilet, refusing to get up and trying to have BM (mostly unsuccessful). Required ModAx3 (PT, tech, RN) to get up from toilet, then totalA pivot in stedy to recliner. He was left up in the recliner with all needs met, chair alarm active, RN aware of patient status and need for stedy +2 for return to bed later.     Follow Up Recommendations  CIR     Equipment Recommendations  Rolling walker with 5" wheels;3in1 (PT)    Recommendations for Other Services       Precautions / Restrictions Precautions Precautions: Fall;Other (comment) Precaution Comments: HOH, seems to also have a mix of expressive difficult and confusion Restrictions Weight Bearing Restrictions: No    Mobility  Bed Mobility Overal bed mobility: Needs Assistance Bed Mobility: Supine to Sit     Supine to sit: Mod assist     General bed mobility comments: ModA initially for bed mobility, faded to Min guard x2 with heavy VC/TC  Transfers Overall  transfer level: Needs assistance Equipment used: Rolling walker (2 wheeled) Transfers: Sit to/from Stand Sit to Stand: Max assist;+2 physical assistance Stand pivot transfers: Total assist       General transfer comment: MaxAx2 for sit to stand with RW; attempted pivot to [facility] but patient unable with unsteadiness with stepping and posterior LOB, poor command following. Switched to stedy and able to stand with ModAX2, totalA pivot to commode. ModAx3 for sit to stand up from commode in stedy.  Ambulation/Gait         Gait velocity: Decreased   General Gait Details: DNT today due to poor command following/posteiror LOB, safety concerns   Stairs             Wheelchair Mobility    Modified Rankin (Stroke Patients Only)       Balance Overall balance assessment: Needs assistance Sitting-balance support: Bilateral upper extremity supported;Feet supported Sitting balance-Leahy Scale: Good     Standing balance support: Bilateral upper extremity supported;During functional activity Standing balance-Leahy Scale: Poor Standing balance comment: heavy posterior lean today                            Cognition Arousal/Alertness: Awake/alert Behavior During Therapy: Impulsive Overall Cognitive Status: Impaired/Different from baseline Area of Impairment: Attention;Following commands;Awareness;Orientation;Memory;Safety/judgement;Problem solving                 Orientation Level: Disoriented to;Place;Time;Situation Current Attention Level: Sustained Memory: Decreased short-term memory Following Commands: Follows one step  commands inconsistently;Follows one step commands with increased time Safety/Judgement: Decreased awareness of safety;Decreased awareness of deficits Awareness: Intellectual Problem Solving: Slow processing;Difficulty sequencing;Decreased initiation;Requires verbal cues;Requires tactile cues General Comments: very slow processing this afternoon,  almost seems like he was sundowning      Exercises      General Comments        Pertinent Vitals/Pain Pain Assessment: Faces Pain Score: 0-No pain Faces Pain Scale: No hurt Pain Intervention(s): Limited activity within patient's tolerance;Monitored during session    Home Living                      Prior Function            PT Goals (current goals can now be found in the care plan section) Acute Rehab PT Goals Patient Stated Goal: talking about how peopel go to the hospital too soon and too often PT Goal Formulation: With patient/family Progress towards PT goals: Progressing toward goals    Frequency    Min 4X/week      PT Plan Current plan remains appropriate    Co-evaluation              AM-PAC PT "6 Clicks" Mobility   Outcome Measure  Help needed turning from your back to your side while in a flat bed without using bedrails?: A Little Help needed moving from lying on your back to sitting on the side of a flat bed without using bedrails?: A Lot Help needed moving to and from a bed to a chair (including a wheelchair)?: A Lot Help needed standing up from a chair using your arms (e.g., wheelchair or bedside chair)?: A Lot Help needed to walk in hospital room?: A Lot Help needed climbing 3-5 steps with a railing? : Total 6 Click Score: 12    End of Session Equipment Utilized During Treatment: Gait belt Activity Tolerance: Patient tolerated treatment well Patient left: in chair;with call bell/phone within reach;with chair alarm set Nurse Communication: Mobility status;Need for lift equipment PT Visit Diagnosis: Unsteadiness on feet (R26.81);Muscle weakness (generalized) (M62.81);Difficulty in walking, not elsewhere classified (R26.2);Other symptoms and signs involving the nervous system (R29.898)     Time: 1510-1610 PT Time Calculation (min) (ACUTE ONLY): 60 min  Charges:  $Therapeutic Activity: 53-67 mins                     Deniece Ree  PT, DPT, CBIS  Supplemental Physical Therapist [place]    Pager [phone] Acute Rehab Office [phone]

## 2019-06-14 NOTE — Progress Notes (Signed)
Results for FILIP, LUTEN (MRN [id number]) as of 06/14/2019 10:35  Ref. Range 06/13/2019 12:07 06/13/2019 13:00 06/13/2019 15:07 06/13/2019 21:23 06/14/2019 07:40  Glucose-Capillary Latest Ref Range: 70 - 99 mg/dL 54 (L) 74 110 (H) 149 (H) 120 (H)  Noted that patient had a blood sugar of 54 mg/dl yesterday at 1200 noon on 10/1.    Recommend discontinuing Glipizide while in the hospital and continue Novolog SENSITIVE correction scale as ordered.   Harvel Ricks RN BSN CDE Diabetes Coordinator Pager: [phone]  8am-5pm

## 2019-06-15 LAB — GLUCOSE, CAPILLARY
Glucose-Capillary: 114 mg/dL — ABNORMAL HIGH (ref 70–99)
Glucose-Capillary: 141 mg/dL — ABNORMAL HIGH (ref 70–99)
Glucose-Capillary: 150 mg/dL — ABNORMAL HIGH (ref 70–99)
Glucose-Capillary: 166 mg/dL — ABNORMAL HIGH (ref 70–99)

## 2019-06-15 MED ORDER — METOPROLOL TARTRATE 5 MG/5ML IV SOLN: 5.0000 mg | Freq: Four times a day (QID) | INTRAVENOUS | Status: DC | PRN

## 2019-06-15 MED ORDER — AMLODIPINE BESYLATE 5 MG PO TABS
5.0000 mg | ORAL_TABLET | Freq: Every morning | ORAL | Status: DC
  Administered 2019-06-16 – 2019-06-17 (×2): 5 mg via ORAL
  Filled 2019-06-15 (×2): qty 1

## 2019-06-15 MED ORDER — METOPROLOL TARTRATE 5 MG/5ML IV SOLN
5.0000 mg | Freq: Four times a day (QID) | INTRAVENOUS | Status: AC | PRN
  Administered 2019-06-15 – 2019-06-17 (×3): 5 mg via INTRAVENOUS
  Filled 2019-06-15 (×3): qty 5

## 2019-06-15 NOTE — Progress Notes (Signed)
PROGRESS NOTE    [name]  GQQ:[id number] DOB: 09-Jun-1936 DOA: 06/10/2019 PCP: [name], MD   Brief Narrative: [name] is a 83 y.o. male with medical history significant forhypertension, diabetes mellitus, asthma, coronary artery disease. Patient presented secondary to confusion and garbled speech and found to have an acute CVA.   Assessment & Plan:   Principal Problem:   Speech abnormality Active Problems:   CVA (cerebral vascular accident) ([place])   Essential hypertension   CKD (chronic kidney disease), stage III   Acute stroke MRI significant for R thalamic/internal capsule infarct CTA significant for 80% left proximal ICA stenosis and 80%/60% right distal/proximal ICA stenosi respectively; carotid dopplers with 60-79% stenosis bilaterally with non-visualized bilateral vertebral arteries. LDL of 66. Hemoglobin A1C of 5.8%. Neurology on board and recommending aspirin for 3 months in addition to Plavix indefinitely. Transthoracic Echocardiogram significant for no thrombus. -Continue aspirin, Plavix, Lipitor -PT/OT recommending CIR  Metabolic encephalopathy Home Xanax held. Patient significantly improved today. Some of this is likely secondary to patient's very poor hearing; hearing aid is now available to the patient.  Essential hypertension Holding home regimen currently secondary to acute stroke -Metoprolol prn  Diabetes mellitus, type 2 -Continue SSI  Abnormal CT Thickened esophagus seen. Likely gastritis. Will need outpatient GI follow-up.  Asthma Stable.  CAD -Continue aspirin and lipitor  CKD stage III Stable.   DVT prophylaxis: Lovenox Code Status:   Code Status: Full Code Family Communication: None at bedside Disposition Plan: Discharge to CIR. Medically stable   Consultants:   Neurology  CIR  Procedures:   Transthoracic Echocardiogram (06/12/2019) IMPRESSIONS    1. Left ventricular ejection fraction, by visual  estimation, is 60 to 65%. The left ventricle has normal function. Normal left ventricular size. There is no left ventricular hypertrophy.  2. Left ventricular diastolic Doppler parameters are consistent with impaired relaxation pattern of LV diastolic filling.  3. Global right ventricle has normal systolic function.The right ventricular size is normal. No increase in right ventricular wall thickness.  4. Left atrial size was normal.  5. Right atrial size was normal.  6. Mild mitral annular calcification.  7. The mitral valve is normal in structure. Trace mitral valve regurgitation. No evidence of mitral stenosis.  8. The tricuspid valve is not well visualized. Tricuspid valve regurgitation is trivial.  9. The aortic valve was not well visualized Aortic valve regurgitation was not visualized by color flow Doppler. 10. The pulmonic valve was not well visualized. Pulmonic valve regurgitation is not visualized by color flow Doppler. 11. Technically difficult; definity used; normal LV systolic function; grade 1 diastolic dysfunction.  FINDINGS  Left Ventricle: Left ventricular ejection fraction, by visual estimation, is 60 to 65%. The left ventricle has normal function. There is no left ventricular hypertrophy. Normal left ventricular size. Spectral Doppler shows Left ventricular diastolic  Doppler parameters are consistent with impaired relaxation pattern of LV diastolic filling.  Right Ventricle: The right ventricular size is normal. Global RV systolic function is has normal systolic function.  Left Atrium: Left atrial size was normal in size.  Right Atrium: Right atrial size was normal in size  Pericardium: There is no evidence of pericardial effusion.  Mitral Valve: The mitral valve is normal in structure. Mild mitral annular calcification. No evidence of mitral valve stenosis by observation. Trace mitral valve regurgitation.  Tricuspid Valve: The tricuspid valve is not well  visualized. Tricuspid valve regurgitation is trivial by color flow Doppler.  Aortic Valve: The aortic valve  was not well visualized. Aortic valve regurgitation was not visualized by color flow Doppler.  Pulmonic Valve: The pulmonic valve was not well visualized. Pulmonic valve regurgitation is not visualized by color flow Doppler.  Aorta: The aortic root, ascending aorta and aortic arch are all structurally normal, with no evidence of dilitation or obstruction.  Venous: The inferior vena cava was not well visualized.  IAS/Shunts: No atrial level shunt detected by color flow Doppler. No ventricular septal defect is seen or detected. There is no evidence of an atrial septal defect.  Antimicrobials:  None    Subjective: No issues today. Wants to take a shower.  Objective: Vitals:   06/14/19 1617 06/14/19 2018 06/15/19 0818 06/15/19 0841  BP: (!) 154/79 (!) 180/67 (!) 184/69 (!) 197/77  Pulse: 79 80    Resp: 18 16 (!) 21 15  Temp: 98.7 F (37.1 C) 98.3 F (36.8 C)  98 F (36.7 C)  TempSrc: Oral Oral  Tympanic  SpO2: 98% 97%  96%  Weight:      Height:        Intake/Output Summary (Last 24 hours) at 06/15/2019 1140 Last data filed at 06/15/2019 1027 Gross per 24 hour  Intake 120 ml  Output 1700 ml  Net -1580 ml   Filed Weights   06/10/19 1356 06/10/19 2109  Weight: 80 kg 84.6 kg    Examination:  General exam: Appears calm and comfortable Respiratory system: Clear to auscultation. Respiratory effort normal. Cardiovascular system: S1 & S2 heard, RRR. No murmurs, rubs, gallops or clicks. Gastrointestinal system: Abdomen is nondistended, soft and nontender. No organomegaly or masses felt. Normal bowel sounds heard. Central nervous system: Alert and oriented. No focal neurological deficits. Extremities: No edema. No calf tenderness Skin: No cyanosis. No rashes Psychiatry: Judgement and insight appear normal. Mood & affect appropriate.    Data Reviewed: I have  personally reviewed following labs and imaging studies  CBC: Recent Labs  Lab 06/10/19 1441 06/12/19 0411  WBC 8.2 7.5  NEUTROABS 5.9  --   HGB 10.2* 10.3*  HCT 32.9* 30.4*  MCV 92.4 88.4  PLT 319 096   Basic Metabolic Panel: Recent Labs  Lab 06/10/19 1441 06/12/19 0411  NA 132* 137  K 3.7 3.8  CL 100 106  CO2 22 23  GLUCOSE 215* 85  BUN 36* 22  CREATININE 1.98* 1.76*  CALCIUM 8.9 8.9   GFR: Estimated Creatinine Clearance: 34.6 mL/min (A) (by C-G formula based on SCr of 1.76 mg/dL (H)). Liver Function Tests: No results for input(s): AST, ALT, ALKPHOS, BILITOT, PROT, ALBUMIN in the last 168 hours. No results for input(s): LIPASE, AMYLASE in the last 168 hours. No results for input(s): AMMONIA in the last 168 hours. Coagulation Profile: Recent Labs  Lab 06/10/19 1441  INR 1.1   Cardiac Enzymes: No results for input(s): CKTOTAL, CKMB, CKMBINDEX, TROPONINI in the last 168 hours. BNP (last 3 results) No results for input(s): PROBNP in the last 8760 hours. HbA1C: No results for input(s): HGBA1C in the last 72 hours. CBG: Recent Labs  Lab 06/14/19 0740 06/14/19 1201 06/14/19 1614 06/14/19 2145 06/15/19 0610  GLUCAP 120* 190* 142* 176* 114*   Lipid Profile: No results for input(s): CHOL, HDL, LDLCALC, TRIG, CHOLHDL, LDLDIRECT in the last 72 hours. Thyroid Function Tests: No results for input(s): TSH, T4TOTAL, FREET4, T3FREE, THYROIDAB in the last 72 hours. Anemia Panel: No results for input(s): VITAMINB12, FOLATE, FERRITIN, TIBC, IRON, RETICCTPCT in the last 72 hours. Sepsis Labs: Recent Labs  Lab 06/10/19 1441 06/10/19 1632  LATICACIDVEN 1.5 0.9    Recent Results (from the past 240 hour(s))  SARS Coronavirus 2 [facility] order, Performed in [facility] hospital lab) Nasopharyngeal Nasopharyngeal Swab     Status: None   Collection Time: 06/10/19  6:29 PM   Specimen: Nasopharyngeal Swab  Result Value Ref Range Status   SARS Coronavirus 2 NEGATIVE  NEGATIVE Final    Comment: (NOTE) If result is NEGATIVE SARS-CoV-2 target nucleic acids are NOT DETECTED. The SARS-CoV-2 RNA is generally detectable in upper and lower  respiratory specimens during the acute phase of infection. The lowest  concentration of SARS-CoV-2 viral copies this assay can detect is 250  copies / mL. A negative result does not preclude SARS-CoV-2 infection  and should not be used as the sole basis for treatment or other  patient management decisions.  A negative result may occur with  improper specimen collection / handling, submission of specimen other  than nasopharyngeal swab, presence of viral mutation(s) within the  areas targeted by this assay, and inadequate number of viral copies  (<250 copies / mL). A negative result must be combined with clinical  observations, patient history, and epidemiological information. If result is POSITIVE SARS-CoV-2 target nucleic acids are DETECTED. The SARS-CoV-2 RNA is generally detectable in upper and lower  respiratory specimens dur ing the acute phase of infection.  Positive  results are indicative of active infection with SARS-CoV-2.  Clinical  correlation with patient history and other diagnostic information is  necessary to determine patient infection status.  Positive results do  not rule out bacterial infection or co-infection with other viruses. If result is PRESUMPTIVE POSTIVE SARS-CoV-2 nucleic acids MAY BE PRESENT.   A presumptive positive result was obtained on the submitted specimen  and confirmed on repeat testing.  While 2019 novel coronavirus  (SARS-CoV-2) nucleic acids may be present in the submitted sample  additional confirmatory testing may be necessary for epidemiological  and / or clinical management purposes  to differentiate between  SARS-CoV-2 and other Sarbecovirus currently known to infect humans.  If clinically indicated additional testing with an alternate test  methodology [phone]) is  advised. The SARS-CoV-2 RNA is generally  detectable in upper and lower respiratory sp ecimens during the acute  phase of infection. The expected result is Negative. Fact Sheet for Patients:  StrictlyIdeas.no Fact Sheet for Healthcare Providers: BankingDealers.co.za This test is not yet approved or cleared by the [place] FDA and has been authorized for detection and/or diagnosis of SARS-CoV-2 by FDA under an Emergency Use Authorization (EUA).  This EUA will remain in effect (meaning this test can be used) for the duration of the COVID-19 declaration under Section 564(b)(1) of the Act, 21 U.S.C. section 360bbb-3(b)(1), unless the authorization is terminated or revoked sooner. Performed at [facility], [address], [place], [place] [address]          Radiology Studies: No results found.      Scheduled Meds: . allopurinol  300 mg Oral QHS  . [START ON 06/16/2019] amLODipine  5 mg Oral q morning - 10a  . aspirin EC  81 mg Oral Daily  . atorvastatin  20 mg Oral QHS  . clopidogrel  75 mg Oral Daily  . enoxaparin (LOVENOX) injection  40 mg Subcutaneous Q24H  . famotidine  20 mg Oral Daily  . glipiZIDE  5 mg Oral q morning - 10a  . insulin aspart  0-5 Units Subcutaneous QHS  . insulin aspart  0-9 Units Subcutaneous TID WC  . loratadine  10 mg Oral Daily   Continuous Infusions:    LOS: 4 days     Cordelia Poche, MD [facility] 06/15/2019, 11:40 AM  If 7PM-7AM, please contact night-coverage [email or web address]

## 2019-06-16 LAB — GLUCOSE, CAPILLARY: Glucose-Capillary: 95 mg/dL (ref 70–99)

## 2019-06-16 NOTE — Progress Notes (Signed)
PROGRESS NOTE    [name]  QIO:[id number] DOB: June 25, 1936 DOA: 06/10/2019 PCP: [name], MD   Brief Narrative: [name] is a 83 y.o. male with medical history significant forhypertension, diabetes mellitus, asthma, coronary artery disease. Patient presented secondary to confusion and garbled speech and found to have an acute CVA.   Assessment & Plan:   Principal Problem:   Speech abnormality Active Problems:   CVA (cerebral vascular accident) ([place])   Essential hypertension   CKD (chronic kidney disease), stage III   Acute stroke MRI significant for R thalamic/internal capsule infarct CTA significant for 80% left proximal ICA stenosis and 80%/60% right distal/proximal ICA stenosi respectively; carotid dopplers with 60-79% stenosis bilaterally with non-visualized bilateral vertebral arteries. LDL of 66. Hemoglobin A1C of 5.8%. Neurology on board and recommending aspirin for 3 months in addition to Plavix indefinitely. Transthoracic Echocardiogram significant for no thrombus. -Continue aspirin, Plavix, Lipitor -PT/OT recommending CIR  Metabolic encephalopathy Home Xanax held. Patient significantly improved today. Some of this is likely secondary to patient's very poor hearing; hearing aid is now available to the patient.  Essential hypertension Holding home regimen currently secondary to acute stroke -Metoprolol prn  Diabetes mellitus, type 2 -Continue SSI  Abnormal CT Thickened esophagus seen. Likely gastritis. Will need outpatient GI follow-up.  Asthma Stable.  CAD -Continue aspirin and lipitor  CKD stage III Stable.   DVT prophylaxis: Lovenox Code Status:   Code Status: Full Code Family Communication: None at bedside Disposition Plan: Discharge to CIR. Medically stable   Consultants:   Neurology  CIR  Procedures:   Transthoracic Echocardiogram (06/12/2019) IMPRESSIONS    1. Left ventricular ejection fraction, by visual  estimation, is 60 to 65%. The left ventricle has normal function. Normal left ventricular size. There is no left ventricular hypertrophy.  2. Left ventricular diastolic Doppler parameters are consistent with impaired relaxation pattern of LV diastolic filling.  3. Global right ventricle has normal systolic function.The right ventricular size is normal. No increase in right ventricular wall thickness.  4. Left atrial size was normal.  5. Right atrial size was normal.  6. Mild mitral annular calcification.  7. The mitral valve is normal in structure. Trace mitral valve regurgitation. No evidence of mitral stenosis.  8. The tricuspid valve is not well visualized. Tricuspid valve regurgitation is trivial.  9. The aortic valve was not well visualized Aortic valve regurgitation was not visualized by color flow Doppler. 10. The pulmonic valve was not well visualized. Pulmonic valve regurgitation is not visualized by color flow Doppler. 11. Technically difficult; definity used; normal LV systolic function; grade 1 diastolic dysfunction.  FINDINGS  Left Ventricle: Left ventricular ejection fraction, by visual estimation, is 60 to 65%. The left ventricle has normal function. There is no left ventricular hypertrophy. Normal left ventricular size. Spectral Doppler shows Left ventricular diastolic  Doppler parameters are consistent with impaired relaxation pattern of LV diastolic filling.  Right Ventricle: The right ventricular size is normal. Global RV systolic function is has normal systolic function.  Left Atrium: Left atrial size was normal in size.  Right Atrium: Right atrial size was normal in size  Pericardium: There is no evidence of pericardial effusion.  Mitral Valve: The mitral valve is normal in structure. Mild mitral annular calcification. No evidence of mitral valve stenosis by observation. Trace mitral valve regurgitation.  Tricuspid Valve: The tricuspid valve is not well  visualized. Tricuspid valve regurgitation is trivial by color flow Doppler.  Aortic Valve: The aortic valve  was not well visualized. Aortic valve regurgitation was not visualized by color flow Doppler.  Pulmonic Valve: The pulmonic valve was not well visualized. Pulmonic valve regurgitation is not visualized by color flow Doppler.  Aorta: The aortic root, ascending aorta and aortic arch are all structurally normal, with no evidence of dilitation or obstruction.  Venous: The inferior vena cava was not well visualized.  IAS/Shunts: No atrial level shunt detected by color flow Doppler. No ventricular septal defect is seen or detected. There is no evidence of an atrial septal defect.  Antimicrobials:  None    Subjective: No concerns today.  Objective: Vitals:   06/15/19 2330 06/16/19 0409 06/16/19 0741 06/16/19 1129  BP: (!) 131/52  (!) 161/63 (!) 165/59  Pulse: 64 64 70 71  Resp: 16  16 16   Temp: 98.5 F (36.9 C) 98.7 F (37.1 C) 97.6 F (36.4 C) 98.1 F (36.7 C)  TempSrc: Oral Axillary Oral Oral  SpO2: 96% 99% 99% 97%  Weight:      Height:        Intake/Output Summary (Last 24 hours) at 06/16/2019 1403 Last data filed at 06/15/2019 1811 Gross per 24 hour  Intake 360 ml  Output 400 ml  Net -40 ml   Filed Weights   06/10/19 1356 06/10/19 2109  Weight: 80 kg 84.6 kg    Examination:  General exam: Appears calm and comfortable Respiratory system: Clear to auscultation. Respiratory effort normal. Cardiovascular system: S1 & S2 heard, RRR. No murmurs, rubs, gallops or clicks. Gastrointestinal system: Abdomen is nondistended, soft and nontender. No organomegaly or masses felt. Normal bowel sounds heard. Central nervous system: Alert. No focal neurological deficits. Extremities: No edema. No calf tenderness. Left hand contracture Skin: No cyanosis. No rashes Psychiatry: Judgement and insight appear normal. Mood & affect appropriate.     Data Reviewed: I have  personally reviewed following labs and imaging studies  CBC: Recent Labs  Lab 06/10/19 1441 06/12/19 0411  WBC 8.2 7.5  NEUTROABS 5.9  --   HGB 10.2* 10.3*  HCT 32.9* 30.4*  MCV 92.4 88.4  PLT 319 443   Basic Metabolic Panel: Recent Labs  Lab 06/10/19 1441 06/12/19 0411  NA 132* 137  K 3.7 3.8  CL 100 106  CO2 22 23  GLUCOSE 215* 85  BUN 36* 22  CREATININE 1.98* 1.76*  CALCIUM 8.9 8.9   GFR: Estimated Creatinine Clearance: 34.6 mL/min (A) (by C-G formula based on SCr of 1.76 mg/dL (H)). Liver Function Tests: No results for input(s): AST, ALT, ALKPHOS, BILITOT, PROT, ALBUMIN in the last 168 hours. No results for input(s): LIPASE, AMYLASE in the last 168 hours. No results for input(s): AMMONIA in the last 168 hours. Coagulation Profile: Recent Labs  Lab 06/10/19 1441  INR 1.1   Cardiac Enzymes: No results for input(s): CKTOTAL, CKMB, CKMBINDEX, TROPONINI in the last 168 hours. BNP (last 3 results) No results for input(s): PROBNP in the last 8760 hours. HbA1C: No results for input(s): HGBA1C in the last 72 hours. CBG: Recent Labs  Lab 06/15/19 0610 06/15/19 1656 06/15/19 1800 06/15/19 2125 06/16/19 0616  GLUCAP 114* 141* 150* 166* 95   Lipid Profile: No results for input(s): CHOL, HDL, LDLCALC, TRIG, CHOLHDL, LDLDIRECT in the last 72 hours. Thyroid Function Tests: No results for input(s): TSH, T4TOTAL, FREET4, T3FREE, THYROIDAB in the last 72 hours. Anemia Panel: No results for input(s): VITAMINB12, FOLATE, FERRITIN, TIBC, IRON, RETICCTPCT in the last 72 hours. Sepsis Labs: Recent Labs  Lab 06/10/19  1441 06/10/19 1632  LATICACIDVEN 1.5 0.9    Recent Results (from the past 240 hour(s))  SARS Coronavirus 2 [facility] order, Performed in [facility] hospital lab) Nasopharyngeal Nasopharyngeal Swab     Status: None   Collection Time: 06/10/19  6:29 PM   Specimen: Nasopharyngeal Swab  Result Value Ref Range Status   SARS Coronavirus 2 NEGATIVE  NEGATIVE Final    Comment: (NOTE) If result is NEGATIVE SARS-CoV-2 target nucleic acids are NOT DETECTED. The SARS-CoV-2 RNA is generally detectable in upper and lower  respiratory specimens during the acute phase of infection. The lowest  concentration of SARS-CoV-2 viral copies this assay can detect is 250  copies / mL. A negative result does not preclude SARS-CoV-2 infection  and should not be used as the sole basis for treatment or other  patient management decisions.  A negative result may occur with  improper specimen collection / handling, submission of specimen other  than nasopharyngeal swab, presence of viral mutation(s) within the  areas targeted by this assay, and inadequate number of viral copies  (<250 copies / mL). A negative result must be combined with clinical  observations, patient history, and epidemiological information. If result is POSITIVE SARS-CoV-2 target nucleic acids are DETECTED. The SARS-CoV-2 RNA is generally detectable in upper and lower  respiratory specimens dur ing the acute phase of infection.  Positive  results are indicative of active infection with SARS-CoV-2.  Clinical  correlation with patient history and other diagnostic information is  necessary to determine patient infection status.  Positive results do  not rule out bacterial infection or co-infection with other viruses. If result is PRESUMPTIVE POSTIVE SARS-CoV-2 nucleic acids MAY BE PRESENT.   A presumptive positive result was obtained on the submitted specimen  and confirmed on repeat testing.  While 2019 novel coronavirus  (SARS-CoV-2) nucleic acids may be present in the submitted sample  additional confirmatory testing may be necessary for epidemiological  and / or clinical management purposes  to differentiate between  SARS-CoV-2 and other Sarbecovirus currently known to infect humans.  If clinically indicated additional testing with an alternate test  methodology [phone]) is  advised. The SARS-CoV-2 RNA is generally  detectable in upper and lower respiratory sp ecimens during the acute  phase of infection. The expected result is Negative. Fact Sheet for Patients:  StrictlyIdeas.no Fact Sheet for Healthcare Providers: BankingDealers.co.za This test is not yet approved or cleared by the [place] FDA and has been authorized for detection and/or diagnosis of SARS-CoV-2 by FDA under an Emergency Use Authorization (EUA).  This EUA will remain in effect (meaning this test can be used) for the duration of the COVID-19 declaration under Section 564(b)(1) of the Act, 21 U.S.C. section 360bbb-3(b)(1), unless the authorization is terminated or revoked sooner. Performed at [facility], [address], [place], [place] [address]          Radiology Studies: No results found.      Scheduled Meds: . allopurinol  300 mg Oral QHS  . amLODipine  5 mg Oral q morning - 10a  . aspirin EC  81 mg Oral Daily  . atorvastatin  20 mg Oral QHS  . clopidogrel  75 mg Oral Daily  . enoxaparin (LOVENOX) injection  40 mg Subcutaneous Q24H  . famotidine  20 mg Oral Daily  . glipiZIDE  5 mg Oral q morning - 10a  . insulin aspart  0-5 Units Subcutaneous QHS  . insulin aspart  0-9 Units Subcutaneous TID WC  .  loratadine  10 mg Oral Daily   Continuous Infusions:    LOS: 5 days     Cordelia Poche, MD [facility] 06/16/2019, 2:03 PM  If 7PM-7AM, please contact night-coverage [email or web address]

## 2019-06-17 ENCOUNTER — Other Ambulatory Visit: Payer: Self-pay

## 2019-06-17 ENCOUNTER — Encounter (HOSPITAL_COMMUNITY): Payer: Self-pay | Admitting: Physical Medicine & Rehabilitation

## 2019-06-17 ENCOUNTER — Inpatient Hospital Stay (HOSPITAL_COMMUNITY)
Admission: RE | Admit: 2019-06-17 | Discharge: 2019-06-29 | DRG: 057 | Disposition: A | Discharge status: Home Health | Payer: Medicare Other | Source: Intra-hospital | Attending: Physical Medicine & Rehabilitation | Admitting: Physical Medicine & Rehabilitation

## 2019-06-17 DIAGNOSIS — E46 Unspecified protein-calorie malnutrition: Secondary | ICD-10-CM

## 2019-06-17 DIAGNOSIS — E1165 Type 2 diabetes mellitus with hyperglycemia: Secondary | ICD-10-CM | POA: Diagnosis not present

## 2019-06-17 DIAGNOSIS — E119 Type 2 diabetes mellitus without complications: Secondary | ICD-10-CM

## 2019-06-17 DIAGNOSIS — I63039 Cerebral infarction due to thrombosis of unspecified carotid artery: Secondary | ICD-10-CM

## 2019-06-17 DIAGNOSIS — Z974 Presence of external hearing-aid: Secondary | ICD-10-CM

## 2019-06-17 DIAGNOSIS — H9193 Unspecified hearing loss, bilateral: Secondary | ICD-10-CM | POA: Diagnosis present

## 2019-06-17 DIAGNOSIS — M109 Gout, unspecified: Secondary | ICD-10-CM | POA: Diagnosis present

## 2019-06-17 DIAGNOSIS — Z79899 Other long term (current) drug therapy: Secondary | ICD-10-CM | POA: Diagnosis not present

## 2019-06-17 DIAGNOSIS — I129 Hypertensive chronic kidney disease with stage 1 through stage 4 chronic kidney disease, or unspecified chronic kidney disease: Secondary | ICD-10-CM | POA: Diagnosis not present

## 2019-06-17 DIAGNOSIS — R4182 Altered mental status, unspecified: Secondary | ICD-10-CM | POA: Diagnosis present

## 2019-06-17 DIAGNOSIS — R7309 Other abnormal glucose: Secondary | ICD-10-CM

## 2019-06-17 DIAGNOSIS — Z7984 Long term (current) use of oral hypoglycemic drugs: Secondary | ICD-10-CM | POA: Diagnosis not present

## 2019-06-17 DIAGNOSIS — I69322 Dysarthria following cerebral infarction: Secondary | ICD-10-CM

## 2019-06-17 DIAGNOSIS — I6523 Occlusion and stenosis of bilateral carotid arteries: Secondary | ICD-10-CM

## 2019-06-17 DIAGNOSIS — I639 Cerebral infarction, unspecified: Secondary | ICD-10-CM

## 2019-06-17 DIAGNOSIS — N183 Chronic kidney disease, stage 3 unspecified: Secondary | ICD-10-CM | POA: Diagnosis present

## 2019-06-17 DIAGNOSIS — D62 Acute posthemorrhagic anemia: Secondary | ICD-10-CM

## 2019-06-17 DIAGNOSIS — E8809 Other disorders of plasma-protein metabolism, not elsewhere classified: Secondary | ICD-10-CM | POA: Diagnosis present

## 2019-06-17 DIAGNOSIS — E11649 Type 2 diabetes mellitus with hypoglycemia without coma: Secondary | ICD-10-CM | POA: Diagnosis not present

## 2019-06-17 DIAGNOSIS — E1122 Type 2 diabetes mellitus with diabetic chronic kidney disease: Secondary | ICD-10-CM | POA: Diagnosis present

## 2019-06-17 DIAGNOSIS — I10 Essential (primary) hypertension: Secondary | ICD-10-CM

## 2019-06-17 DIAGNOSIS — N1831 Chronic kidney disease, stage 3a: Secondary | ICD-10-CM

## 2019-06-17 DIAGNOSIS — Z951 Presence of aortocoronary bypass graft: Secondary | ICD-10-CM

## 2019-06-17 DIAGNOSIS — I63032 Cerebral infarction due to thrombosis of left carotid artery: Secondary | ICD-10-CM | POA: Diagnosis not present

## 2019-06-17 DIAGNOSIS — Q740 Other congenital malformations of upper limb(s), including shoulder girdle: Secondary | ICD-10-CM

## 2019-06-17 DIAGNOSIS — E441 Mild protein-calorie malnutrition: Secondary | ICD-10-CM | POA: Diagnosis not present

## 2019-06-17 DIAGNOSIS — E785 Hyperlipidemia, unspecified: Secondary | ICD-10-CM | POA: Diagnosis present

## 2019-06-17 DIAGNOSIS — Z6826 Body mass index (BMI) 26.0-26.9, adult: Secondary | ICD-10-CM

## 2019-06-17 DIAGNOSIS — I2510 Atherosclerotic heart disease of native coronary artery without angina pectoris: Secondary | ICD-10-CM | POA: Diagnosis not present

## 2019-06-17 DIAGNOSIS — I6930 Unspecified sequelae of cerebral infarction: Secondary | ICD-10-CM

## 2019-06-17 DIAGNOSIS — I69354 Hemiplegia and hemiparesis following cerebral infarction affecting left non-dominant side: Principal | ICD-10-CM

## 2019-06-17 DIAGNOSIS — I25810 Atherosclerosis of coronary artery bypass graft(s) without angina pectoris: Secondary | ICD-10-CM

## 2019-06-17 DIAGNOSIS — Z23 Encounter for immunization: Secondary | ICD-10-CM

## 2019-06-17 DIAGNOSIS — I6381 Other cerebral infarction due to occlusion or stenosis of small artery: Secondary | ICD-10-CM

## 2019-06-17 HISTORY — DX: Other cerebral infarction due to occlusion or stenosis of small artery: I63.81

## 2019-06-17 HISTORY — DX: Cerebral infarction, unspecified: I63.9

## 2019-06-17 LAB — GLUCOSE, CAPILLARY
Glucose-Capillary: 192 mg/dL — ABNORMAL HIGH (ref 70–99)
Glucose-Capillary: 149 mg/dL — ABNORMAL HIGH (ref 70–99)
Glucose-Capillary: 161 mg/dL — ABNORMAL HIGH (ref 70–99)
Glucose-Capillary: 119 mg/dL — ABNORMAL HIGH (ref 70–99)
Glucose-Capillary: 127 mg/dL — ABNORMAL HIGH (ref 70–99)
Glucose-Capillary: 137 mg/dL — ABNORMAL HIGH (ref 70–99)
Glucose-Capillary: 142 mg/dL — ABNORMAL HIGH (ref 70–99)
Glucose-Capillary: 117 mg/dL — ABNORMAL HIGH (ref 70–99)

## 2019-06-17 LAB — CBC
HCT: 32.2 % — ABNORMAL LOW (ref 39.0–52.0)
Hemoglobin: 10.7 g/dL — ABNORMAL LOW (ref 13.0–17.0)
MCH: 29.5 pg (ref 26.0–34.0)
MCHC: 33.2 g/dL (ref 30.0–36.0)
MCV: 88.7 fL (ref 80.0–100.0)
Platelets: 399 10*3/uL (ref 150–400)
RBC: 3.63 MIL/uL — ABNORMAL LOW (ref 4.22–5.81)
RDW: 14.4 % (ref 11.5–15.5)
WBC: 8.2 10*3/uL (ref 4.0–10.5)
nRBC: 0 % (ref 0.0–0.2)

## 2019-06-17 LAB — CREATININE, SERUM
Creatinine, Ser: 2.07 mg/dL — ABNORMAL HIGH (ref 0.61–1.24)
GFR calc Af Amer: 34 mL/min — ABNORMAL LOW (ref >60)
GFR calc non Af Amer: 29 mL/min — ABNORMAL LOW (ref >60)

## 2019-06-17 MED ORDER — GLIPIZIDE 5 MG PO TABS
5.0000 mg | ORAL_TABLET | Freq: Every day | ORAL | Status: DC
  Administered 2019-06-18 – 2019-06-29 (×12): 5 mg via ORAL
  Filled 2019-06-17 (×14): qty 1

## 2019-06-17 MED ORDER — ALLOPURINOL 300 MG PO TABS
300.0000 mg | ORAL_TABLET | Freq: Every day | ORAL | Status: DC
  Administered 2019-06-17 – 2019-06-29 (×12): 300 mg via ORAL
  Filled 2019-06-17 (×12): qty 1

## 2019-06-17 MED ORDER — ACETAMINOPHEN 650 MG RE SUPP: 650.0000 mg | RECTAL | Status: DC | PRN

## 2019-06-17 MED ORDER — ACETAMINOPHEN 160 MG/5ML PO SOLN: 650.0000 mg | ORAL | Status: DC | PRN

## 2019-06-17 MED ORDER — AMLODIPINE BESYLATE 5 MG PO TABS
5.0000 mg | ORAL_TABLET | Freq: Every day | ORAL | Status: DC
  Administered 2019-06-18 – 2019-06-29 (×12): 5 mg via ORAL
  Filled 2019-06-17 (×12): qty 1

## 2019-06-17 MED ORDER — CLOPIDOGREL BISULFATE 75 MG PO TABS
75.0000 mg | ORAL_TABLET | Freq: Every day | ORAL | Status: DC
  Administered 2019-06-18 – 2019-06-29 (×12): 75 mg via ORAL
  Filled 2019-06-17 (×12): qty 1

## 2019-06-17 MED ORDER — LORATADINE 10 MG PO TABS
10.0000 mg | ORAL_TABLET | Freq: Every day | ORAL | Status: DC
  Administered 2019-06-18 – 2019-06-29 (×12): 10 mg via ORAL
  Filled 2019-06-17 (×12): qty 1

## 2019-06-17 MED ORDER — ENOXAPARIN SODIUM 40 MG/0.4ML ~~LOC~~ SOLN
40.0000 mg | SUBCUTANEOUS | Status: DC
  Administered 2019-06-17 – 2019-06-29 (×12): 40 mg via SUBCUTANEOUS
  Filled 2019-06-17 (×12): qty 0.4

## 2019-06-17 MED ORDER — SENNOSIDES-DOCUSATE SODIUM 8.6-50 MG PO TABS: 1.0000 | ORAL_TABLET | Freq: Every evening | ORAL | Status: DC | PRN

## 2019-06-17 MED ORDER — INSULIN ASPART 100 UNIT/ML ~~LOC~~ SOLN
0.0000 [IU] | Freq: Three times a day (TID) | SUBCUTANEOUS | Status: DC
  Administered 2019-06-18 (×2): 1 [IU] via SUBCUTANEOUS
  Administered 2019-06-19 – 2019-06-20 (×3): 2 [IU] via SUBCUTANEOUS
  Administered 2019-06-20 – 2019-06-21 (×4): 1 [IU] via SUBCUTANEOUS
  Administered 2019-06-22 – 2019-06-25 (×6): 2 [IU] via SUBCUTANEOUS
  Administered 2019-06-26: 1 [IU] via SUBCUTANEOUS
  Administered 2019-06-27: 3 [IU] via SUBCUTANEOUS
  Administered 2019-06-28: 2 [IU] via SUBCUTANEOUS

## 2019-06-17 MED ORDER — ACETAMINOPHEN 325 MG PO TABS
650.0000 mg | ORAL_TABLET | ORAL | Status: DC | PRN
  Administered 2019-06-18 – 2019-06-29 (×26): 650 mg via ORAL
  Filled 2019-06-17 (×24): qty 2

## 2019-06-17 MED ORDER — ASPIRIN EC 81 MG PO TBEC
81.0000 mg | DELAYED_RELEASE_TABLET | Freq: Every day | ORAL | Status: DC
  Administered 2019-06-18 – 2019-06-29 (×12): 81 mg via ORAL
  Filled 2019-06-17 (×12): qty 1

## 2019-06-17 MED ORDER — FAMOTIDINE 20 MG PO TABS
20.0000 mg | ORAL_TABLET | Freq: Every day | ORAL | Status: DC
  Administered 2019-06-18 – 2019-06-29 (×12): 20 mg via ORAL
  Filled 2019-06-17 (×12): qty 1

## 2019-06-17 MED ORDER — SORBITOL 70 % SOLN: 30.0000 mL | Freq: Every day | Status: DC | PRN

## 2019-06-17 MED ORDER — ATORVASTATIN CALCIUM 10 MG PO TABS
20.0000 mg | ORAL_TABLET | Freq: Every day | ORAL | Status: DC
  Administered 2019-06-17 – 2019-06-29 (×12): 20 mg via ORAL
  Filled 2019-06-17 (×12): qty 2

## 2019-06-17 MED ORDER — ALBUTEROL SULFATE (2.5 MG/3ML) 0.083% IN NEBU
2.5000 mg | INHALATION_SOLUTION | Freq: Four times a day (QID) | RESPIRATORY_TRACT | Status: DC | PRN
  Administered 2019-06-21 (×2): 2.5 mg via RESPIRATORY_TRACT
  Filled 2019-06-17 (×2): qty 3

## 2019-06-17 MED ORDER — ENOXAPARIN SODIUM 40 MG/0.4ML ~~LOC~~ SOLN: 40.0000 mg | SUBCUTANEOUS | Status: DC

## 2019-06-17 NOTE — Care Management Important Message (Signed)
Important Message  Patient Details  Name: KENYA SHIRAISHI MRN: [id number] Date of Birth: 1936/02/20   Medicare Important Message Given:  Yes     Johnathyn Viscomi 06/17/2019, 4:02 PM

## 2019-06-17 NOTE — Progress Notes (Signed)
Jamse Arn, MD  Physician  Physical Medicine and Rehabilitation  PMR Pre-admission  Signed  Date of Service:  06/13/2019 4:06 PM      Related encounter: ED to Hosp-Admission (Current) from 06/10/2019 in [facility]      Signed         PMR Admission Coordinator Pre-Admission Assessment  Patient: [name] is an 83 y.o., male MRN: [id number] DOB: 02-27-1936 Height: _0  (177.8 cm) Weight: 84.6 kg  Insurance Information HMO: yes    PPO:      PCP:      IPA:      80/20:      OTHER:  PRIMARY: [employer] Medicare      Policy#: [id number]      Subscriber: Patient CM Name: [name]      Phone#: [phone]     Fax#: (original updated clinicals were faxed to: [phone]) Pre-Cert#: [id number]      Employer:  Auth provided by Denyse Amass for admit to CIR. Pt is approved for 7 days, starting 10/5. Clinical updates are due on 10/11 to (f): [id number] (no follow up CM assigned at this time.) Benefits:  Phone #: online     Name: [email or web address] Eff. Date: 09/12/2018 - 09/12/2019     Deduct: does not have ($0)      Out of Pocket Max: $3,600 ($15.00 met)      Life Max:  CIR: $295/day co-pay for days 1-5, $0/day co-pay for days 6+      SNF: $0/day co-pay for days 1-20, $160/day co-pay for days 21-43, $0/day co-pay for days 44-100; limited to 100 days/cal yr Outpatient: limited by medical necessity     Co-Pay: $30/visit Home Health: 100% coverage; limited by medical necessity      Co-Pay: 0% co-insurance DME: 80% coverage     Co-Pay: 20% co-insurance  Providers:  SECONDARY: None      Policy#:       Subscriber:  CM Name:       Phone#:      Fax#:  Pre-Cert#:       Employer:  Benefits:  Phone #:      Name:  Eff. Date:     Deduct:       Out of Pocket Max:       Life Max:  CIR:       SNF:  Outpatient     Co-Pay:  Home Health:       Co-Pay:  DME:      Co-Pay:   Medicaid Application Date:       Case Manager:  Disability Application Date:       Case Worker:   The  Data Collection Information Summary for patients in Inpatient Rehabilitation Facilities with attached Privacy Act [place] Records was provided and verbally reviewed with: Family  Emergency Contact Information         Contact Information    Name Relation Home Work [place] Spouse [phone]   [name] Daughter   [phone]      Current Medical History  Patient Admitting Diagnosis: Right thalamic/internal capsule infarct  History of Present Illness: [name] is an 83 year old male with history of CAD with CABG 1997 maintained on aspirin, hard of hearing, CKD stage III, diabetes mellitus, hypertension, asthma. Presented 06/10/2019 with recent fall 10 days ago without loss of consciousness. Family notes decrease in balance as well as bouts of nausea. Urine drug screen positive benzos, sodium 132, creatinine  1.98, lactic acid within normal limits alcohol level negative, COVID negative. Cranial CT scan negative for any acute intracranial abnormalities. Chronic small vessel ischemic with small chronic infarcts. CT angiogram head and neck occluded right vertebral artery. 80% proximal left ICA stenosis. MRI showed small acute infarcts medial right thalamus and right internal capsule. Carotid Dopplers did show 67.9% right ICA and 67.9% left ICA stenosis. Echocardiogram with ejection fraction 65% without emboli. Neurology consulted placed on aspirin 59m daily plus Plavix 75 mg daily x3 months then Plavix alone. Subcutaneous Lovenox for DVT prophylaxis. There was a plan follow-up outpatient vascular surgery to address carotid stenosis. Tolerating a regular consistency diet. Therapy evaluation completed and patient is to be admitted for a comprehensive rehab program on 06/17/2019   Complete NIHSS TOTAL: 10  [facility] been reviewed by the rehabilitation  admission coordinator and physician.  Past Medical History      Past Medical History:  Diagnosis Date   Anxiety    Arthritis    Asthma    Coronary artery disease    [place]    Gout    HOH (hard of hearing) bilateral   Hypertension    PONV (postoperative nausea and vomiting)     Family History   family history is not on file.  Prior Rehab/Hospitalizations Has the patient had prior rehab or hospitalizations prior to admission? Yes  Has the patient had major surgery during 100 days prior to admission? No              Current Medications  Current Facility-Administered Medications:    acetaminophen (TYLENOL) tablet 650 mg, 650 mg, Oral, Q4H PRN **OR** acetaminophen (TYLENOL) solution 650 mg, 650 mg, Per Tube, Q4H PRN **OR** acetaminophen (TYLENOL) suppository 650 mg, 650 mg, Rectal, Q4H PRN, Emokpae, Ejiroghene E, MD   albuterol (PROVENTIL) (2.5 MG/3ML) 0.083% nebulizer solution 2.5 mg, 2.5 mg, Nebulization, Q6H PRN, NMariel Aloe MD   allopurinol (ZYLOPRIM) tablet 300 mg, 300 mg, Oral, QHS, Emokpae, Ejiroghene E, MD, 300 mg at 06/16/19 2154   amLODipine (NORVASC) tablet 5 mg, 5 mg, Oral, q morning - 10a, NMariel Aloe MD, 5 mg at 06/17/19 1017   aspirin EC tablet 81 mg, 81 mg, Oral, Daily, Biby, Sharon L, NP, 81 mg at 06/17/19 1011   atorvastatin (LIPITOR) tablet 20 mg, 20 mg, Oral, QHS, Emokpae, Ejiroghene E, MD, 20 mg at 06/16/19 2154   clopidogrel (PLAVIX) tablet 75 mg, 75 mg, Oral, Daily, Biby, Sharon L, NP, 75 mg at 06/17/19 1011   enoxaparin (LOVENOX) injection 40 mg, 40 mg, Subcutaneous, Q24H, Emokpae, Ejiroghene E, MD, 40 mg at 06/16/19 2154   famotidine (PEPCID) tablet 20 mg, 20 mg, Oral, Daily, Emokpae, Ejiroghene E, MD, 20 mg at 06/17/19 1011   glipiZIDE (GLUCOTROL) tablet 5 mg, 5 mg, Oral, q morning - 10a, Emokpae, Ejiroghene E, MD, 5 mg at 06/17/19 1011   insulin aspart (novoLOG) injection 0-5  Units, 0-5 Units, Subcutaneous, QHS, Emokpae, Ejiroghene E, MD   insulin aspart (novoLOG) injection 0-9 Units, 0-9 Units, Subcutaneous, TID WC, Emokpae, Ejiroghene E, MD, 1 Units at 06/17/19 1229   loratadine (CLARITIN) tablet 10 mg, 10 mg, Oral, Daily, Emokpae, Ejiroghene E, MD, 10 mg at 06/17/19 1011   nystatin cream (MYCOSTATIN) 1 application, 1 application, Topical, Daily PRN, NMariel Aloe MD   senna-docusate (Senokot-S) tablet 1 tablet, 1 tablet, Oral, QHS PRN, Emokpae, Ejiroghene E, MD  Facility-Administered Medications Ordered  in Other Encounters:    fentaNYL (SUBLIMAZE) injection 25-50 mcg, 25-50 mcg, Intravenous, Q5 min PRN, Lerry Liner, MD  Patients Current Diet:     Diet Order                  Diet heart healthy/carb modified Room service appropriate? No; Fluid consistency: Thin  Diet effective now         Diet - low sodium heart healthy               Precautions / Restrictions Precautions Precautions: Fall, Other (comment) Precaution Comments: HOH, seems to also have a mix of expressive difficult and confusion Restrictions Weight Bearing Restrictions: No   Has the patient had 2 or more falls or a fall with injury in the past year? Yes  Prior Activity Level Community (5-7x/wk): very Independent PTA, mowed yards, drove; was doing well until fall in yard Sept 19th  Prior Functional Level Self Care: Did the patient need help bathing, dressing, using the toilet or eating? Independent  Indoor Mobility: Did the patient need assistance with walking from room to room (with or without device)? Independent  Stairs: Did the patient need assistance with internal or external stairs (with or without device)? Independent  Functional Cognition: Did the patient need help planning regular tasks such as shopping or remembering to take medications? Independent  Home Assistive Devices / Equipment    Prior Device Use: Indicate devices/aids used by  the patient prior to current illness, exacerbation or injury? NA until fall on Sept 19th then quad cane use  Current Functional Level Cognition  Arousal/Alertness: Awake/alert Overall Cognitive Status: Impaired/Different from baseline Current Attention Level: Sustained Orientation Level: Oriented to person, Oriented to place, Disoriented to time, Disoriented to situation Following Commands: Follows one step commands inconsistently, Follows one step commands with increased time Safety/Judgement: Decreased awareness of safety, Decreased awareness of deficits General Comments: very slow processing this afternoon, almost seems like he was sundowning Attention: Focused, Sustained Focused Attention: Impaired Focused Attention Impairment: Verbal basic Sustained Attention: Impaired Sustained Attention Impairment: Verbal complex    Extremity Assessment (includes Sensation/Coordination)  Upper Extremity Assessment: LUE deficits/detail LUE Deficits / Details: birth deficit - elbow flexion , shorter extermity and wrist contracture present. pt demonstrates decrease awareness to L UE this session. pt with decrease activation of L UE  Lower Extremity Assessment: Defer to PT evaluation    ADLs  Overall ADL's : Needs assistance/impaired Eating/Feeding: Maximal assistance Grooming: Maximal assistance Grooming Details (indicate cue type and reason): pt was able to remove dentures. pt was able to brush teeth mod (A). pt was able to wash face min (A). pt was more automatic with adls.  Upper Body Bathing: Maximal assistance Lower Body Bathing: Total assistance Upper Body Dressing : Maximal assistance Lower Body Dressing: Total assistance Toilet Transfer: +2 for physical assistance, Maximal assistance Functional mobility during ADLs: +2 for physical assistance, Maximal assistance General ADL Comments: pt progresed from bed to sink (5 steps and then required sitting) pt with L knee sleeve for  compression present int he room. Wife present throughout session    Mobility  Overal bed mobility: Needs Assistance Bed Mobility: Supine to Sit Supine to sit: Mod assist Sit to supine: Mod assist General bed mobility comments: Mod assistance to advance LEs to edge of bed and to elevate trunk into a seated position.    Transfers  Overall transfer level: Needs assistance Equipment used: Rolling walker (2 wheeled) Transfers: Sit to/from Stand Sit to Stand:  Mod assist Stand pivot transfers: Max assist General transfer comment: Pt performed face to face transfer with posterior bias.  He was able to perform steps away from bed toward his chair before sitting.    Ambulation / Gait / Stairs / Wheelchair Mobility  Ambulation/Gait Ambulation/Gait assistance: Max Web designer (Feet): 4 Feet Assistive device: None Gait Pattern/deviations: Shuffle, Leaning posteriorly General Gait Details: Side steps to the R from bed to recliner chair. Gait velocity: Decreased    Posture / Balance Balance Overall balance assessment: Needs assistance Sitting-balance support: Bilateral upper extremity supported, Feet supported Sitting balance-Leahy Scale: Good Standing balance support: Bilateral upper extremity supported, During functional activity Standing balance-Leahy Scale: Poor Standing balance comment: heavy posterior lean today    Special needs/care consideration BiPAP/CPAP : no CPM :no Continuous Drip IV : no Dialysis : no        Days : no Life Vest : no Oxygen : no Special Bed : no Trach Size : no Wound Vac (area) : no      Location : no Skin: ecchymosis to BUEs                       Bowel mgmt: incontinent, last BM 10/3 Bladder mgmt: incontinent Diabetic mgmt: yes Behavioral consideration : confused Chemo/radiation : no   Previous Home Environment (from acute therapy documentation)  Lives With: Spouse Available Help at Discharge: Family Type of Home: (Lives at home-  unsure of type) Additional Comments: patient not able to provide information, family not present to clarify  Discharge Living Setting Plans for Discharge Living Setting: Patient's home, Lives with (comment)(wife) Type of Home at Discharge: House Discharge Home Layout: One level(with basement) Discharge Home Access: Stairs to enter(can put in ramp if needed-per wife) Entrance Stairs-Rails: Can reach both Entrance Stairs-Number of Steps: 2 Discharge Bathroom Shower/Tub: Walk-in shower Discharge Bathroom Toilet: Handicapped height Discharge Bathroom Accessibility: Yes How Accessible: Accessible via walker Does the patient have any problems obtaining your medications?: No  Social/Family/Support Systems Patient Roles: Spouse Contact Information: wife: [name] (home: [phone]; cell: [phone]); daugther [name] (work cell: [phone], cell: [phone]; home: [phone]) Anticipated Caregiver: wife (daugther and son-in-law) can assist at times as well Anticipated Caregiver's Contact Information: see above Ability/Limitations of Caregiver: Min A Caregiver Availability: 24/7 Discharge Plan Discussed with Primary Caregiver: Yes Is Caregiver In Agreement with Plan?: Yes Does Caregiver/Family have Issues with Lodging/Transportation while Pt is in Rehab?: No  Goals/Additional Needs Patient/Family Goal for Rehab: PT/OT: Supervision/Min A; SLP: Supervision Expected length of stay: 12-16 days Cultural Considerations: NA Dietary Needs: heart healthy, carb modified; thin liquids; room service not appropriate.  Equipment Needs: TBD Pt/Family Agrees to Admission and willing to participate: Yes Program Orientation Provided & Reviewed with Pt/Caregiver Including Roles  & Responsibilities: Yes(reviewed with pt and wife and daughter)  Barriers to Discharge: Home environment access/layout, Behavior  Barriers to Discharge Comments: steps to enter; pt impulsive in current state  Decrease  burden of Care through IP rehab admission: NA  Possible need for SNF placement upon discharge: Not anticipated; wife is prepared for light physical assistance at home and does not plan on pt going to SNF. Wife states she can provide 24/7 A and can manage confusion at DC if needed.   Patient Condition: I have reviewed medical records from [facility], spoken with MD and RN, and patient, spouse and daughter. I met with patient at the bedside for inpatient rehabilitation assessment.  Patient will benefit from  ongoing PT, OT and SLP, can actively participate in 3 hours of therapy a day 5 days of the week, and can make measurable gains during the admission.  Patient will also benefit from the coordinated team approach during an Inpatient Acute Rehabilitation admission.  The patient will receive intensive therapy as well as Rehabilitation physician, nursing, social worker, and care management interventions.  Due to bladder management, bowel management, safety, skin/wound care, disease management, medication administration, pain management and patient education the patient requires 24 hour a day rehabilitation nursing.  The patient is currently Max A with mobility and Max A to total A for basic ADLs.  Discharge setting and therapy post discharge at home with home health is anticipated.  Patient has agreed to participate in the Acute Inpatient Rehabilitation Program and will admit 06/17/2019.  Preadmission Screen Completed By:  Raechel Ache, 06/17/2019 4:43 PM ______________________________________________________________________   Discussed status with Dr. Posey Pronto on 06/17/2019 at 4:40PM and received approval for admission today.  Admission Coordinator:  Raechel Ache, OT, time 4:40PM/Date 06/17/2019   Assessment/Plan: Diagnosis: Right thalamic/internal capsule infarct  1. Does the need for close, 24 hr/day Medical supervision in concert with the patient's rehab needs make it unreasonable for  this patient to be served in a less intensive setting? Yes 2. Co-Morbidities requiring supervision/potential complications: CAD with CABG 1997 maintained on aspirin, hard of hearing, CKD stage III, diabetes mellitus, hypertension, asthma. 3. Due to bladder management, safety, disease management and patient education, does the patient require 24 hr/day rehab nursing? Yes 4. Does the patient require coordinated care of a physician, rehab nurse, PT (1-2 hrs/day, 5 days/week), OT (1-2 hrs/day, 5 days/week) and SLP (1-2 hrs/day, 5 days/week) to address physical and functional deficits in the context of the above medical diagnosis(es)? Yes Addressing deficits in the following areas: balance, endurance, locomotion, strength, transferring, bathing, dressing, toileting, cognition and psychosocial support 5. Can the patient actively participate in an intensive therapy program of at least 3 hrs of therapy 5 days a week? Yes 6. The potential for patient to make measurable gains while on inpatient rehab is excellent 7. Anticipated functional outcomes upon discharge from inpatients are: min assist PT, min assist OT, min assist SLP 8. Estimated rehab length of stay to reach the above functional goals is: 14-17 days. 9. Anticipated D/C setting: Home 10. Anticipated post D/C treatments: HH therapy and Home excercise program 11. Overall Rehab/Functional Prognosis: good  MD Signature: Delice Lesch, MD, ABPMR        Revision History Date/Time User Provider Type Action  06/17/2019 5:02 PM Jamse Arn, MD Physician Sign  06/17/2019 4:43 PM Raechel Ache, [facility] Admission Coordinator Share  View Details Report

## 2019-06-17 NOTE — Progress Notes (Signed)
Inpatient Rehabilitation-Admissions Coordinator   I have insurance approval and medical approval from Dr. Lonny Prude for admit to CIR today. Pt and wife on board for CIR. Forms and letters have been reviewed and signed. RN, CM/SW updated on plan for today.   Please call if questions.   [name], OTR/L  Rehab Admissions Coordinator  [phone] 06/17/2019 5:01 PM

## 2019-06-17 NOTE — Discharge Summary (Signed)
Physician Discharge Summary  [name] WYO:[id number] DOB: [birth year] DOA: 06/10/2019  PCP: [name], MD  Admit date: 06/10/2019 Discharge date: 06/17/2019  Admitted From: Home Disposition: CIR  Recommendations for Outpatient Follow-up:  1. Follow up with PCP in 1 week 2. Follow up with neurology 3. Will need GI follow-up for esophageal thickening 4. Please obtain BMP/CBC in one week 5. Please follow up on the following pending results: None  Home Health: CIR Equipment/Devices: CIR  Discharge Condition: Stable CODE STATUS: Full code Diet recommendation: Heart healthy   Brief/Interim Summary:  Admission HPI written by Bethena Roys, MD   Chief Complaint: Altered mental status  HPI: [name] is a 83 y.o. male with medical history significant for hypertension, diabetes mellitus, asthma, coronary artery disease, who was brought to the ED by EMS, reports of confusion and garbled speech.  Symptoms started at about 12.15-12.20 , but patient's spouse present at bedside tells me this was about 10:30 AM today.  History is obtained from the spouse, as patient still is not able to give me a history due to speech difficulties versus comprehension versus hearing problems.  Patient spouse reported over the past several days, patient has been very drowsy and sleepy.  Today patient woke up, per spouse he had some gait abnormality this morning, but he was able to stand on his own and groom himself.  He also had breakfast.  Patient tried to respond verbally but his speech was garbled, and not making sense.  This has never happened before. At baseline patient has hearing difficulty uses a hearing aid, currently has only 1 of a pair, as the 2nd hearing aid is currently defective.  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 She denies any fever or  chills, no pain with urination, no difficulty breathing no cough, no vomiting no loose stools.  ED Course: blood pressure systolic 774J to 287O.  Blood alcohol level less than 10.  CTA head negative for acute abnormality, chronic small vessel ischemia with small chronic infarcts.  Subsequent CTA head and neck and cerebral perfusion study-occluded right vertebral artery, patent left vertebral artery with moderate to severe stenosis of its origin, 80% distal right carotid artery stenosis and 60% proximal right ICA stenosis, 80% proximal left ICA stenosis, moderate right and mild left intracranial ICA stenosis.  Negative CT PE. Telemetry neurology neurology was consulted, recommended admission for stroke work-up.  Patient was not an endovascular candidate based on NIHSS 3 and CTP without perfusion deficit.   Hospital course:  Acute stroke MRI significant for R thalamic/internal capsule infarct CTA significant for 80% left proximal ICA stenosis and 80%/60% right distal/proximal ICA stenosi respectively; carotid dopplers with 60-79% stenosis bilaterally with non-visualized bilateral vertebral arteries. LDL of 66. Hemoglobin A1C of 5.8%. Neurology on board and recommending aspirin for 3 months in addition to Plavix indefinitely. Transthoracic Echocardiogram significant for no thrombus. Discharge to CIR.  Metabolic encephalopathy Home Xanax held. Patient has some waxing and waning of orientation but otherwise no longer encephalopathic. Some of this is likely secondary to patient's very poor hearing; hearing aid is now available to the patient.  Essential hypertension Holding home regimen currently secondary to acute stroke. Continue amlodipine, lisinopril and atenolol.  Diabetes mellitus, type 2 Continue SSI  Abnormal CT Thickened esophagus seen. Likely gastritis. Will need outpatient GI follow-up.  Asthma Stable.  CAD  Continue aspirin and lipitor  CKD stage III Stable.  Discharge  Diagnoses:  Principal Problem:   Speech abnormality Active Problems:   CVA (cerebral vascular accident) ([place])   Essential hypertension   CKD (chronic kidney disease), stage III    Discharge Instructions  Discharge Instructions    Diet - low sodium heart healthy   Complete by: As directed    Increase activity slowly   Complete by: As directed      Allergies as of 06/17/2019      Reactions   Nsaids Nausea And Vomiting      Medication List    TAKE these medications   albuterol 108 (90 Base) MCG/ACT inhaler Commonly known as: VENTOLIN HFA Inhale 1-2 puffs into the lungs every 6 (six) hours as needed for wheezing or shortness of breath.   allopurinol 300 MG tablet Commonly known as: ZYLOPRIM Take 300 mg by mouth at bedtime.   ALPRAZolam 0.5 MG tablet Commonly known as: XANAX Take 0.25-0.5 mg by mouth 3 (three) times daily as needed for anxiety.   amLODipine 5 MG tablet Commonly known as: NORVASC Take 5 mg by mouth every morning.   aspirin 81 MG EC tablet Take 1 tablet (81 mg total) by mouth daily. Take for three months. What changed:   medication strength  how much to take  when to take this  additional instructions   atenolol 100 MG tablet Commonly known as: TENORMIN Take 1 tablet (100 mg total) by mouth every morning.   atorvastatin 40 MG tablet Commonly known as: LIPITOR Take 20 mg by mouth at bedtime.   cholecalciferol 25 MCG (1000 UT) tablet Commonly known as: VITAMIN D3 Take 1,000 Units by mouth every evening.   clopidogrel 75 MG tablet Commonly known as: PLAVIX Take 1 tablet (75 mg total) by mouth daily.   co-enzyme Q-10 30 MG capsule Take 30 mg by mouth at bedtime.   famotidine 20 MG tablet Commonly known as: PEPCID Take 1 tablet (20 mg total) by mouth daily.   furosemide 20 MG tablet Commonly known as: LASIX Take 20 mg by mouth every morning. *May take as needed for fluid retention   glipiZIDE 5 MG tablet Commonly known as:  GLUCOTROL Take 5 mg by mouth every morning.   lisinopril 5 MG tablet Commonly known as: ZESTRIL Take 1 tablet (5 mg total) by mouth every morning.   loratadine 10 MG tablet Commonly known as: CLARITIN Take 10 mg by mouth daily.   Lutein 20 MG Caps Take 1 capsule by mouth every morning.   nystatin cream Commonly known as: MYCOSTATIN Apply 1 application topically daily as needed for dry skin ((genital areas)).   ondansetron 4 MG disintegrating tablet Commonly known as: Zofran ODT Take 1 tablet (4 mg total) by mouth every 8 (eight) hours as needed for nausea or vomiting.   theophylline 300 MG 12 hr tablet Commonly known as: THEODUR Take 300 mg by mouth 2 (two) times daily.   triamcinolone cream 0.1 % Commonly known as: KENALOG Apply 1 application topically daily as needed (for irritation (face)).   vitamin B-12 250 MCG tablet Commonly known as: CYANOCOBALAMIN Take 250 mcg by mouth every evening.   vitamin C 500 MG tablet Commonly known as: ASCORBIC ACID Take 500 mg by mouth every evening.   VITAMIN E COMPLEX PO Take 1 capsule by mouth every evening.            Durable Medical Equipment  (From admission, onward)  Start     Ordered   06/12/19 1655  For home use only DME Walker rolling  Once    Question:  Patient needs a walker to treat with the following condition  Answer:  Stroke (cerebrum) ([place])   06/12/19 1654   06/12/19 1654  For home use only DME 3 n 1  Once     06/12/19 1654         Follow-up Information    [name], MD. Schedule an appointment as soon as possible for a visit in 1 week(s).   Specialty: Internal Medicine Contact information: [address] [place] [address] [phone]        Garvin Fila, MD. Schedule an appointment as soon as possible for a visit in 4 week(s).   Specialties: Neurology, Radiology Contact information: [address] [place] [address] [phone]           Allergies  Allergen Reactions   Nsaids Nausea And Vomiting    Consultations:  Neurology   Procedures/Studies: Ct Angio Head W Or Wo Contrast  Result Date: 06/10/2019 CLINICAL DATA:  Acute altered mental status/confusion. Speech disturbance. EXAM: CT ANGIOGRAPHY HEAD AND NECK CT PERFUSION BRAIN TECHNIQUE: Multidetector CT imaging of the head and neck was performed using the standard protocol during bolus administration of intravenous contrast. Multiplanar CT image reconstructions and MIPs were obtained to evaluate the vascular anatomy. Carotid stenosis measurements (when applicable) are obtained utilizing NASCET criteria, using the distal internal carotid diameter as the denominator. Multiphase CT imaging of the brain was performed following IV bolus contrast injection. Subsequent parametric perfusion maps were calculated using RAPID software. CONTRAST:  153mL OMNIPAQUE IOHEXOL 350 MG/ML SOLN COMPARISON:  None. FINDINGS: CTA NECK FINDINGS Aortic arch: Standard 3 vessel aortic arch with moderate atherosclerotic plaque. Calcified plaque at both subclavian artery origins does not result in significant stenosis. Right carotid system: Patent with predominantly calcified plaque about the carotid bifurcation resulting in approximately 80% distal common carotid artery stenosis an approximately 60% proximal ICA stenosis. Retropharyngeal course of the proximal ICA. Moderate ECA origin stenosis. Left carotid system: Patent with predominantly calcified plaque about the carotid bifurcation resulting in 80% proximal ICA stenosis. Partially retropharyngeal course of the proximal ICA. Vertebral arteries: The left vertebral artery is patent with a moderate to severe origin stenosis due to calcified plaque. The right vertebral artery is occluded at its origin with reconstitution of only a small portion of the V2 segment. Skeleton: No fracture or focal osseous lesion. Moderate to severe multilevel cervical disc and  facet degeneration. Interbody and left-sided facet ankylosis at C4-5. Other neck: No evidence of cervical lymphadenopathy or mass. Upper chest: Mild apical lung scarring. Trace right pleural effusion. Review of the MIP images confirms the above findings CTA HEAD FINDINGS Anterior circulation: The internal carotid arteries are patent from skull base to carotid termini with relatively diffuse calcified plaque resulting in moderate right and mild left proximal supraclinoid stenoses. ACAs and MCAs are patent without evidence of proximal branch occlusion or significant proximal stenosis. Posterior circulation: The intracranial left vertebral artery is widely patent and supplies the basilar. Opacification of a small right V4 segment is likely retrograde. Patent left PICA, right AICA, and bilateral SCA origins are identified. The basilar artery is widely patent. There is a fetal origin of the left PCA. Moderate proximal right P2, severe distal left P2, and severe left greater than right P3 stenoses are present. No aneurysm is identified. Venous sinuses: Patent. Anatomic variants: Fetal left  PCA. Review of the MIP images confirms the above findings CT Brain Perfusion Findings: ASPECTS: 10 CBF (<30%) Volume: 51mL Perfusion (Tmax>6.0s) volume: 5mL Mismatch Volume: 30mL Infarction Location: None evident by CTP IMPRESSION: 1. Occluded right vertebral artery. 2. Patent left vertebral artery with moderate to severe stenosis of its origin. 3. 80% distal right common carotid artery stenosis and 60% proximal right ICA stenosis. 4. 80% proximal left ICA stenosis. 5. Moderate right and mild left intracranial ICA stenoses. 6. Negative CTP. 7.  Aortic Atherosclerosis (ICD10-I70.0). These results were called by telephone at the time of interpretation on 06/10/2019 at 2:47 pm to Dr. Veryl Speak , who verbally acknowledged these results. Electronically Signed   By: Logan Bores M.D.   On: 06/10/2019 15:18   Dg Elbow Complete Left  Result  Date: 06/04/2019 CLINICAL DATA:  83 year old male with fall. EXAM: LEFT ELBOW - COMPLETE 3+ VIEW COMPARISON:  None. FINDINGS: Evaluation is limited due to suboptimal patient positioning and absence of a true 90 degree lateral view. No definite acute fracture identified on provided images. The bones are osteopenic. There is no dislocation. No significant joint effusion. There is fatty atrophy of musculature. Vascular calcifications noted. The soft tissues are otherwise unremarkable. IMPRESSION: 1. No definite acute fracture or dislocation. No significant joint effusion. 2. Osteopenia. Electronically Signed   By: Anner Crete M.D.   On: 06/04/2019 21:34   Ct Head Wo Contrast  Result Date: 06/10/2019 CLINICAL DATA:  Acute altered mental status/confusion. Improving speech abnormality. EXAM: CT HEAD WITHOUT CONTRAST TECHNIQUE: Contiguous axial images were obtained from the base of the skull through the vertex without intravenous contrast. COMPARISON:  06/04/2019 FINDINGS: Brain: There is no evidence of acute infarct, intracranial hemorrhage, mass, midline shift, or extra-axial fluid collection. 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 Small chronic cortical/subcortical infarcts are also again seen in the right parietal and left occipital lobes. Patchy hypodensities elsewhere in the cerebral white matter bilaterally are unchanged and nonspecific but compatible with mild-to-moderate chronic small vessel ischemic disease. There is mild cerebral atrophy. Vascular: Calcified atherosclerosis at the skull base. No hyperdense vessel. Skull: No fracture or focal osseous lesion. Sinuses/Orbits: Mild right sphenoid sinus mucosal thickening. Clear mastoid air cells. Bilateral cataract extraction. Other: None. IMPRESSION: 1. No evidence of acute intracranial abnormality.  ASPECTS of 10. 2. Chronic small vessel ischemia with small chronic infarcts as above. These results were  called by telephone at the time of interpretation on 06/10/2019 at 2:16 pm to Dr. Veryl Speak , who verbally acknowledged these results. Electronically Signed   By: Logan Bores M.D.   On: 06/10/2019 14:18   Ct Head Wo Contrast  Result Date: 06/04/2019 CLINICAL DATA:  [place] today. EXAM: CT HEAD WITHOUT CONTRAST CT CERVICAL SPINE WITHOUT CONTRAST TECHNIQUE: Multidetector CT imaging of the head and cervical spine was performed following the standard protocol without intravenous contrast. Multiplanar CT image reconstructions of the cervical spine were also generated. COMPARISON:  None. FINDINGS: CT HEAD FINDINGS Brain: Moderate atrophy. Chronic lacunar infarction right basal ganglia. Patchy hypodensity in the cerebral white matter bilaterally asymmetric in the right parietal lobe most consistent with chronic ischemia. Negative for acute infarct, hemorrhage, or mass. Vascular: Negative for hyperdense vessel Skull: Negative Sinuses/Orbits: Mild mucosal edema paranasal sinuses. Bilateral cataract surgery Other: None CT CERVICAL SPINE FINDINGS Alignment: Normal alignment.  Straightening of the cervical lordosis Skull base and vertebrae: Negative for fracture Soft tissues and spinal canal: Negative for soft tissue  mass. Extensive atherosclerotic calcification in the carotid arteries bilaterally. Disc levels: Multilevel disc degeneration and spondylosis. Mild facet degeneration. Multilevel spinal and foraminal stenosis. No areas of critical spinal stenosis. Upper chest: Lung apices clear bilaterally. Other: None IMPRESSION: Atrophy and chronic ischemic change. No acute intracranial abnormality Negative for cervical spine fracture.  Cervical spondylosis. Carotid artery disease. Electronically Signed   By: Franchot Gallo M.D.   On: 06/04/2019 19:41   Ct Angio Neck W And/or Wo Contrast  Result Date: 06/10/2019 CLINICAL DATA:  Acute altered mental status/confusion. Speech disturbance. EXAM: CT ANGIOGRAPHY HEAD AND NECK CT  PERFUSION BRAIN TECHNIQUE: Multidetector CT imaging of the head and neck was performed using the standard protocol during bolus administration of intravenous contrast. Multiplanar CT image reconstructions and MIPs were obtained to evaluate the vascular anatomy. Carotid stenosis measurements (when applicable) are obtained utilizing NASCET criteria, using the distal internal carotid diameter as the denominator. Multiphase CT imaging of the brain was performed following IV bolus contrast injection. Subsequent parametric perfusion maps were calculated using RAPID software. CONTRAST:  116mL OMNIPAQUE IOHEXOL 350 MG/ML SOLN COMPARISON:  None. FINDINGS: CTA NECK FINDINGS Aortic arch: Standard 3 vessel aortic arch with moderate atherosclerotic plaque. Calcified plaque at both subclavian artery origins does not result in significant stenosis. Right carotid system: Patent with predominantly calcified plaque about the carotid bifurcation resulting in approximately 80% distal common carotid artery stenosis an approximately 60% proximal ICA stenosis. Retropharyngeal course of the proximal ICA. Moderate ECA origin stenosis. Left carotid system: Patent with predominantly calcified plaque about the carotid bifurcation resulting in 80% proximal ICA stenosis. Partially retropharyngeal course of the proximal ICA. Vertebral arteries: The left vertebral artery is patent with a moderate to severe origin stenosis due to calcified plaque. The right vertebral artery is occluded at its origin with reconstitution of only a small portion of the V2 segment. Skeleton: No fracture or focal osseous lesion. Moderate to severe multilevel cervical disc and facet degeneration. Interbody and left-sided facet ankylosis at C4-5. Other neck: No evidence of cervical lymphadenopathy or mass. Upper chest: Mild apical lung scarring. Trace right pleural effusion. Review of the MIP images confirms the above findings CTA HEAD FINDINGS Anterior circulation: The  internal carotid arteries are patent from skull base to carotid termini with relatively diffuse calcified plaque resulting in moderate right and mild left proximal supraclinoid stenoses. ACAs and MCAs are patent without evidence of proximal branch occlusion or significant proximal stenosis. Posterior circulation: The intracranial left vertebral artery is widely patent and supplies the basilar. Opacification of a small right V4 segment is likely retrograde. Patent left PICA, right AICA, and bilateral SCA origins are identified. The basilar artery is widely patent. There is a fetal origin of the left PCA. Moderate proximal right P2, severe distal left P2, and severe left greater than right P3 stenoses are present. No aneurysm is identified. Venous sinuses: Patent. Anatomic variants: Fetal left PCA. Review of the MIP images confirms the above findings CT Brain Perfusion Findings: ASPECTS: 10 CBF (<30%) Volume: 59mL Perfusion (Tmax>6.0s) volume: 43mL Mismatch Volume: 64mL Infarction Location: None evident by CTP IMPRESSION: 1. Occluded right vertebral artery. 2. Patent left vertebral artery with moderate to severe stenosis of its origin. 3. 80% distal right common carotid artery stenosis and 60% proximal right ICA stenosis. 4. 80% proximal left ICA stenosis. 5. Moderate right and mild left intracranial ICA stenoses. 6. Negative CTP. 7.  Aortic Atherosclerosis (ICD10-I70.0). These results were called by telephone at the time of interpretation on 06/10/2019  at 2:47 pm to Dr. Veryl Speak , who verbally acknowledged these results. Electronically Signed   By: Logan Bores M.D.   On: 06/10/2019 15:18   Ct Cervical Spine Wo Contrast  Result Date: 06/04/2019 CLINICAL DATA:  [place] today. EXAM: CT HEAD WITHOUT CONTRAST CT CERVICAL SPINE WITHOUT CONTRAST TECHNIQUE: Multidetector CT imaging of the head and cervical spine was performed following the standard protocol without intravenous contrast. Multiplanar CT image reconstructions  of the cervical spine were also generated. COMPARISON:  None. FINDINGS: CT HEAD FINDINGS Brain: Moderate atrophy. Chronic lacunar infarction right basal ganglia. Patchy hypodensity in the cerebral white matter bilaterally asymmetric in the right parietal lobe most consistent with chronic ischemia. Negative for acute infarct, hemorrhage, or mass. Vascular: Negative for hyperdense vessel Skull: Negative Sinuses/Orbits: Mild mucosal edema paranasal sinuses. Bilateral cataract surgery Other: None CT CERVICAL SPINE FINDINGS Alignment: Normal alignment.  Straightening of the cervical lordosis Skull base and vertebrae: Negative for fracture Soft tissues and spinal canal: Negative for soft tissue mass. Extensive atherosclerotic calcification in the carotid arteries bilaterally. Disc levels: Multilevel disc degeneration and spondylosis. Mild facet degeneration. Multilevel spinal and foraminal stenosis. No areas of critical spinal stenosis. Upper chest: Lung apices clear bilaterally. Other: None IMPRESSION: Atrophy and chronic ischemic change. No acute intracranial abnormality Negative for cervical spine fracture.  Cervical spondylosis. Carotid artery disease. Electronically Signed   By: Franchot Gallo M.D.   On: 06/04/2019 19:41   Mr Brain Wo Contrast  Result Date: 06/11/2019 CLINICAL DATA:  Acute onset garbled speech EXAM: MRI HEAD WITHOUT CONTRAST TECHNIQUE: Multiplanar, multiecho pulse sequences of the brain and surrounding structures were obtained without intravenous contrast. COMPARISON:  Head CT, CTA, and CT perfusion from yesterday FINDINGS: Brain: Small areas of restricted diffusion along the medial right thalamus and right corona radiata. Remote lacunar infarct with mineralization at the right basal ganglia. Small remote left occipital cortex infarct. Moderate chronic small vessel ischemic type gliosis in the cerebral white matter. Mild-to-moderate generalized volume loss. No acute hemorrhage, hydrocephalus, or  masslike finding. Vascular: Absent right vertebral flow void which correlates with CTA. Other major flow voids are preserved. Skull and upper cervical spine: Negative for marrow lesion Sinuses/Orbits: No emergent finding. There is minimal left mastoid opacification with negative nasopharynx. Bilateral cataract resection. IMPRESSION: 1. Small acute infarcts at the medial right thalamus and right internal capsule. 2. Chronic ischemic injury including remote right basal ganglia infarct and left occipital cortex infarct. Electronically Signed   By: Monte Fantasia M.D.   On: 06/11/2019 04:35   Ct Abdomen Pelvis W Contrast  Result Date: 06/04/2019 CLINICAL DATA:  83 year old male with acute generalized abdominal pain. EXAM: CT ABDOMEN AND PELVIS WITH CONTRAST TECHNIQUE: Multidetector CT imaging of the abdomen and pelvis was performed using the standard protocol following bolus administration of intravenous contrast. CONTRAST:  61mL OMNIPAQUE IOHEXOL 300 MG/ML  SOLN COMPARISON:  Renal ultrasound dated 06/12/2015 FINDINGS: Lower chest: Partially visualized small right pleural effusion. Subsegmental bibasilar atelectasis versus infiltrate. Faint nodular density at the right lung base may represent developing infiltrate. Clinical correlation is recommended. There is multi vessel coronary vascular calcification and calcification of the visualized thoracic aorta. No intra-abdominal free air or free fluid. Hepatobiliary: The liver is unremarkable. No intrahepatic biliary ductal dilatation. Multiple stones noted in the proximal gallbladder. No pericholecystic fluid or evidence of acute cholecystitis by CT. Pancreas: Unremarkable. No pancreatic ductal dilatation or surrounding inflammatory changes. Spleen: Normal in size without focal abnormality. Adrenals/Urinary Tract: Small calcific foci in the  left adrenal gland, likely sequela of prior insult. There is a 3 cm fat containing right adrenal nodule most consistent with an  adenoma versus a myelolipoma. Several small calcification within this nodule likely sequela of prior insult. There are multiple bilateral renal cysts measure up to 3.5 cm in the superior pole of the right kidney. Additional smaller hypodense lesions are not well characterized. There is no hydronephrosis on either side. There is symmetric enhancement and excretion of contrast by both kidneys. The visualized ureters and urinary bladder appear unremarkable. Stomach/Bowel: There is diffuse thickening of distal esophagus with inflammatory changes which may represent esophagitis. An infiltrative esophageal neoplasm is not entirely excluded but favored less likely. Clinical correlation and follow-up recommended. There is sigmoid diverticulosis without active inflammatory changes. Moderate stool noted throughout the colon. There is no bowel obstruction. The appendix is normal. Vascular/Lymphatic: Moderate aortoiliac atherosclerotic disease. The IVC is unremarkable. No portal venous gas. No adenopathy. Top-normal passive visual lymph node measures approximately 7 mm in short axis. Reproductive: The prostate and seminal vesicles are grossly unremarkable. Other: None Musculoskeletal: Degenerative changes of the spine. Multilevel disc desiccation and vacuum phenomena. No acute osseous pathology. IMPRESSION: 1. Findings most consistent with esophagitis. An esophageal neoplasm is favored less likely but not excluded. Clinical correlation and follow-up recommended. 2. Cholelithiasis. 3. Sigmoid diverticulosis. No bowel obstruction or active inflammation. Normal appendix. Aortic Atherosclerosis (ICD10-I70.0). Electronically Signed   By: Anner Crete M.D.   On: 06/04/2019 19:50   Ct Cerebral Perfusion W Contrast  Result Date: 06/10/2019 CLINICAL DATA:  Acute altered mental status/confusion. Speech disturbance. EXAM: CT ANGIOGRAPHY HEAD AND NECK CT PERFUSION BRAIN TECHNIQUE: Multidetector CT imaging of the head and neck was  performed using the standard protocol during bolus administration of intravenous contrast. Multiplanar CT image reconstructions and MIPs were obtained to evaluate the vascular anatomy. Carotid stenosis measurements (when applicable) are obtained utilizing NASCET criteria, using the distal internal carotid diameter as the denominator. Multiphase CT imaging of the brain was performed following IV bolus contrast injection. Subsequent parametric perfusion maps were calculated using RAPID software. CONTRAST:  138mL OMNIPAQUE IOHEXOL 350 MG/ML SOLN COMPARISON:  None. FINDINGS: CTA NECK FINDINGS Aortic arch: Standard 3 vessel aortic arch with moderate atherosclerotic plaque. Calcified plaque at both subclavian artery origins does not result in significant stenosis. Right carotid system: Patent with predominantly calcified plaque about the carotid bifurcation resulting in approximately 80% distal common carotid artery stenosis an approximately 60% proximal ICA stenosis. Retropharyngeal course of the proximal ICA. Moderate ECA origin stenosis. Left carotid system: Patent with predominantly calcified plaque about the carotid bifurcation resulting in 80% proximal ICA stenosis. Partially retropharyngeal course of the proximal ICA. Vertebral arteries: The left vertebral artery is patent with a moderate to severe origin stenosis due to calcified plaque. The right vertebral artery is occluded at its origin with reconstitution of only a small portion of the V2 segment. Skeleton: No fracture or focal osseous lesion. Moderate to severe multilevel cervical disc and facet degeneration. Interbody and left-sided facet ankylosis at C4-5. Other neck: No evidence of cervical lymphadenopathy or mass. Upper chest: Mild apical lung scarring. Trace right pleural effusion. Review of the MIP images confirms the above findings CTA HEAD FINDINGS Anterior circulation: The internal carotid arteries are patent from skull base to carotid termini with  relatively diffuse calcified plaque resulting in moderate right and mild left proximal supraclinoid stenoses. ACAs and MCAs are patent without evidence of proximal branch occlusion or significant proximal stenosis. Posterior circulation: The intracranial  left vertebral artery is widely patent and supplies the basilar. Opacification of a small right V4 segment is likely retrograde. Patent left PICA, right AICA, and bilateral SCA origins are identified. The basilar artery is widely patent. There is a fetal origin of the left PCA. Moderate proximal right P2, severe distal left P2, and severe left greater than right P3 stenoses are present. No aneurysm is identified. Venous sinuses: Patent. Anatomic variants: Fetal left PCA. Review of the MIP images confirms the above findings CT Brain Perfusion Findings: ASPECTS: 10 CBF (<30%) Volume: 82mL Perfusion (Tmax>6.0s) volume: 21mL Mismatch Volume: 17mL Infarction Location: None evident by CTP IMPRESSION: 1. Occluded right vertebral artery. 2. Patent left vertebral artery with moderate to severe stenosis of its origin. 3. 80% distal right common carotid artery stenosis and 60% proximal right ICA stenosis. 4. 80% proximal left ICA stenosis. 5. Moderate right and mild left intracranial ICA stenoses. 6. Negative CTP. 7.  Aortic Atherosclerosis (ICD10-I70.0). These results were called by telephone at the time of interpretation on 06/10/2019 at 2:47 pm to Dr. Veryl Speak , who verbally acknowledged these results. Electronically Signed   By: Logan Bores M.D.   On: 06/10/2019 15:18   Dg Chest Portable 1 View  Result Date: 06/04/2019 CLINICAL DATA:  83 year old male with fall. EXAM: PORTABLE CHEST 1 VIEW COMPARISON:  Chest CT dated 06/04/2019 FINDINGS: There is mild cardiomegaly with mild vascular congestion. Small bilateral pleural effusions may be present. No focal consolidation or pneumothorax. Median sternotomy wires. No acute osseous pathology. IMPRESSION: Mild cardiomegaly  with mild vascular congestion and small pleural effusion. Electronically Signed   By: Anner Crete M.D.   On: 06/04/2019 21:31   Vas US Carotid  Result Date: 06/12/2019 Carotid Arterial Duplex Study Indications:   CVA and Carotid artery disease. Risk Factors:  Hypertension, Diabetes. Other Factors: CTA neck right ICA 60% left ICA 80% stenosis. Limitations    Today's exam was limited due to the patient's inability or                unwillingness to cooperate. Performing Technologist: June Leap RDMS, RVT  Examination Guidelines: A complete evaluation includes B-mode imaging, spectral Doppler, color Doppler, and power Doppler as needed of all accessible portions of each vessel. Bilateral testing is considered an integral part of a complete examination. Limited examinations for reoccurring indications may be performed as noted.  Right Carotid Findings: +----------+-------+-------+--------+---------------------------------+--------+             PSV     EDV     Stenosis Plaque Description                Comments              cm/s    cm/s                                                         +----------+-------+-------+--------+---------------------------------+--------+  CCA Prox   76      12                                                           +----------+-------+-------+--------+---------------------------------+--------+  CCA Distal 84      6                heterogenous                                +----------+-------+-------+--------+---------------------------------+--------+  ICA Prox   406     70      60-79%   heterogenous, calcific and                                                       irregular                                   +----------+-------+-------+--------+---------------------------------+--------+  ICA Distal 75      11                                                           +----------+-------+-------+--------+---------------------------------+--------+  ECA        423     26       >50%     heterogenous, calcific and                                                       irregular                                   +----------+-------+-------+--------+---------------------------------+--------+ +---------+--------+--------+------+  Vertebral PSV cm/s EDV cm/s Absent  +---------+--------+--------+------+  Left Carotid Findings: +----------+-------+-------+--------+---------------------------------+--------+             PSV     EDV     Stenosis Plaque Description                Comments              cm/s    cm/s                                                         +----------+-------+-------+--------+---------------------------------+--------+  CCA Prox   90      16                                                           +----------+-------+-------+--------+---------------------------------+--------+  CCA Distal 103     18                                                           +----------+-------+-------+--------+---------------------------------+--------+  ICA Prox   271     60      60-79%   heterogenous, calcific and                                                       irregular                                   +----------+-------+-------+--------+---------------------------------+--------+  ICA Distal 136     19                                                           +----------+-------+-------+--------+---------------------------------+--------+  ECA        170     5                                                            +----------+-------+-------+--------+---------------------------------+--------+ +---------+--------+--------+------+  Vertebral PSV cm/s EDV cm/s Absent  +---------+--------+--------+------+  Summary: Right Carotid: Velocities in the right ICA are consistent with a 60-79%                stenosis. The ECA appears >50% stenosed. Left Carotid: Velocities in the left ICA are consistent with a 60-79% stenosis. Vertebrals: Bilateral vertebral arteries were not  visualized. *See table(s) above for measurements and observations.  Electronically signed by Antony Contras MD on 06/12/2019 at 12:39:16 PM.    Final       Transthoracic Echocardiogram (06/12/2019) IMPRESSIONS   1. Left ventricular ejection fraction, by visual estimation, is 60 to 65%. The left ventricle has normal function. Normal left ventricular size. There is no left ventricular hypertrophy. 2. Left ventricular diastolic Doppler parameters are consistent with impaired relaxation pattern of LV diastolic filling. 3. Global right ventricle has normal systolic function.The right ventricular size is normal. No increase in right ventricular wall thickness. 4. Left atrial size was normal. 5. Right atrial size was normal. 6. Mild mitral annular calcification. 7. The mitral valve is normal in structure. Trace mitral valve regurgitation. No evidence of mitral stenosis. 8. The tricuspid valve is not well visualized. Tricuspid valve regurgitation is trivial. 9. The aortic valve was not well visualized Aortic valve regurgitation was not visualized by color flow Doppler. 10. The pulmonic valve was not well visualized. Pulmonic valve regurgitation is not visualized by color flow Doppler. 11. Technically difficult; definity used; normal LV systolic function; grade 1 diastolic dysfunction.  FINDINGS Left Ventricle: Left ventricular ejection fraction, by visual estimation, is 60 to 65%. The left ventricle has normal function. There is no left ventricular hypertrophy. Normal left ventricular size. Spectral Doppler shows Left ventricular diastolic  Doppler parameters are consistent with impaired relaxation pattern of LV diastolic filling.  Right Ventricle: The right ventricular size is normal. Global RV systolic function is has normal systolic function.  Left Atrium: Left atrial size was normal in size.  Right Atrium: Right atrial size was  normal in size  Pericardium: There is no evidence  of pericardial effusion.  Mitral Valve: The mitral valve is normal in structure. Mild mitral annular calcification. No evidence of mitral valve stenosis by observation. Trace mitral valve regurgitation.  Tricuspid Valve: The tricuspid valve is not well visualized. Tricuspid valve regurgitation is trivial by color flow Doppler.  Aortic Valve: The aortic valve was not well visualized. Aortic valve regurgitation was not visualized by color flow Doppler.  Pulmonic Valve: The pulmonic valve was not well visualized. Pulmonic valve regurgitation is not visualized by color flow Doppler.  Aorta: The aortic root, ascending aorta and aortic arch are all structurally normal, with no evidence of dilitation or obstruction.  Venous: The inferior vena cava was not well visualized.  IAS/Shunts: No atrial level shunt detected by color flow Doppler. No ventricular septal defect is seen or detected. There is no evidence of an atrial septal defect.   Subjective: No issues per patient other than wanting his arm band off.  Discharge Exam: Vitals:   06/17/19 1207 06/17/19 1537  BP: (!) 154/71 (!) 175/72  Pulse: 74 80  Resp: 16 17  Temp: 99.7 F (37.6 C) 99.6 F (37.6 C)  SpO2: 98% 99%   Vitals:   06/17/19 0836 06/17/19 1006 06/17/19 1207 06/17/19 1537  BP: (!) 171/78 (!) 148/67 (!) 154/71 (!) 175/72  Pulse: 78  74 80  Resp: 12 13 16 17   Temp: 99.5 F (37.5 C)  99.7 F (37.6 C) 99.6 F (37.6 C)  TempSrc: Oral  Oral Oral  SpO2: 98%  98% 99%  Weight:      Height:        General exam: Appears calm and comfortable  Respiratory system: Clear to auscultation. Respiratory effort normal. Cardiovascular system: S1 & S2 heard, RRR. No murmurs, rubs, gallops or clicks. Gastrointestinal system: Abdomen is nondistended, soft and nontender. No organomegaly or masses felt. Normal bowel sounds heard. Central nervous system: Alert. Follows commands. Conversant. Oriented to person. 4/5 strength of  right side. Hearing impairment Extremities: No edema. No calf tenderness. Left UE contracture of wrist and fingers Skin: No cyanosis. No rashes   The results of significant diagnostics from this hospitalization (including imaging, microbiology, ancillary and laboratory) are listed below for reference.     Microbiology: Recent Results (from the past 240 hour(s))  SARS Coronavirus 2 [facility] order, Performed in [facility] hospital lab) Nasopharyngeal Nasopharyngeal Swab     Status: None   Collection Time: 06/10/19  6:29 PM   Specimen: Nasopharyngeal Swab  Result Value Ref Range Status   SARS Coronavirus 2 NEGATIVE NEGATIVE Final    Comment: (NOTE) If result is NEGATIVE SARS-CoV-2 target nucleic acids are NOT DETECTED. The SARS-CoV-2 RNA is generally detectable in upper and lower  respiratory specimens during the acute phase of infection. The lowest  concentration of SARS-CoV-2 viral copies this assay can detect is 250  copies / mL. A negative result does not preclude SARS-CoV-2 infection  and should not be used as the sole basis for treatment or other  patient management decisions.  A negative result may occur with  improper specimen collection / handling, submission of specimen other  than nasopharyngeal swab, presence of viral mutation(s) within the  areas targeted by this assay, and inadequate number of viral copies  (<250 copies / mL). A negative result must be combined with clinical  observations, patient history, and epidemiological information. If result is POSITIVE SARS-CoV-2 target nucleic acids are DETECTED. The SARS-CoV-2 RNA is  generally detectable in upper and lower  respiratory specimens dur ing the acute phase of infection.  Positive  results are indicative of active infection with SARS-CoV-2.  Clinical  correlation with patient history and other diagnostic information is  necessary to determine patient infection status.  Positive results do  not rule out bacterial  infection or co-infection with other viruses. If result is PRESUMPTIVE POSTIVE SARS-CoV-2 nucleic acids MAY BE PRESENT.   A presumptive positive result was obtained on the submitted specimen  and confirmed on repeat testing.  While 2019 novel coronavirus  (SARS-CoV-2) nucleic acids may be present in the submitted sample  additional confirmatory testing may be necessary for epidemiological  and / or clinical management purposes  to differentiate between  SARS-CoV-2 and other Sarbecovirus currently known to infect humans.  If clinically indicated additional testing with an alternate test  methodology [phone]) is advised. The SARS-CoV-2 RNA is generally  detectable in upper and lower respiratory sp ecimens during the acute  phase of infection. The expected result is Negative. Fact Sheet for Patients:  StrictlyIdeas.no Fact Sheet for Healthcare Providers: BankingDealers.co.za This test is not yet approved or cleared by the [place] FDA and has been authorized for detection and/or diagnosis of SARS-CoV-2 by FDA under an Emergency Use Authorization (EUA).  This EUA will remain in effect (meaning this test can be used) for the duration of the COVID-19 declaration under Section 564(b)(1) of the Act, 21 U.S.C. section 360bbb-3(b)(1), unless the authorization is terminated or revoked sooner. Performed at [facility], [address], [place], [place] [address]      Labs: BNP (last 3 results) No results for input(s): BNP in the last 8760 hours. Basic Metabolic Panel: Recent Labs  Lab 06/12/19 0411  NA 137  K 3.8  CL 106  CO2 23  GLUCOSE 85  BUN 22  CREATININE 1.76*  CALCIUM 8.9   Liver Function Tests: No results for input(s): AST, ALT, ALKPHOS, BILITOT, PROT, ALBUMIN in the last 168 hours. No results for input(s): LIPASE, AMYLASE in the last 168 hours. No results for input(s): AMMONIA in the last 168 hours. CBC: Recent Labs   Lab 06/12/19 0411  WBC 7.5  HGB 10.3*  HCT 30.4*  MCV 88.4  PLT 276   Cardiac Enzymes: No results for input(s): CKTOTAL, CKMB, CKMBINDEX, TROPONINI in the last 168 hours. BNP: Invalid input(s): POCBNP CBG: Recent Labs  Lab 06/16/19 1613 06/16/19 2114 06/17/19 0637 06/17/19 1203 06/17/19 1542  GLUCAP 161* 119* 127* 137* 142*   D-Dimer No results for input(s): DDIMER in the last 72 hours. Hgb A1c No results for input(s): HGBA1C in the last 72 hours. Lipid Profile No results for input(s): CHOL, HDL, LDLCALC, TRIG, CHOLHDL, LDLDIRECT in the last 72 hours. Thyroid function studies No results for input(s): TSH, T4TOTAL, T3FREE, THYROIDAB in the last 72 hours.  Invalid input(s): FREET3 Anemia work up No results for input(s): VITAMINB12, FOLATE, FERRITIN, TIBC, IRON, RETICCTPCT in the last 72 hours. Urinalysis    Component Value Date/Time   COLORURINE YELLOW 06/10/2019 [place] 06/10/2019 1353   LABSPEC 1.015 06/10/2019 1353   PHURINE 5.0 06/10/2019 1353   GLUCOSEU NEGATIVE 06/10/2019 1353   HGBUR NEGATIVE 06/10/2019 1353   BILIRUBINUR NEGATIVE 06/10/2019 1353   KETONESUR NEGATIVE 06/10/2019 1353   PROTEINUR NEGATIVE 06/10/2019 1353   NITRITE NEGATIVE 06/10/2019 1353   LEUKOCYTESUR NEGATIVE 06/10/2019 1353   Sepsis Labs Invalid input(s): PROCALCITONIN,  WBC,  LACTICIDVEN Microbiology Recent Results (from the past  240 hour(s))  SARS Coronavirus 2 [facility] order, Performed in [facility] hospital lab) Nasopharyngeal Nasopharyngeal Swab     Status: None   Collection Time: 06/10/19  6:29 PM   Specimen: Nasopharyngeal Swab  Result Value Ref Range Status   SARS Coronavirus 2 NEGATIVE NEGATIVE Final    Comment: (NOTE) If result is NEGATIVE SARS-CoV-2 target nucleic acids are NOT DETECTED. The SARS-CoV-2 RNA is generally detectable in upper and lower  respiratory specimens during the acute phase of infection. The lowest  concentration of SARS-CoV-2  viral copies this assay can detect is 250  copies / mL. A negative result does not preclude SARS-CoV-2 infection  and should not be used as the sole basis for treatment or other  patient management decisions.  A negative result may occur with  improper specimen collection / handling, submission of specimen other  than nasopharyngeal swab, presence of viral mutation(s) within the  areas targeted by this assay, and inadequate number of viral copies  (<250 copies / mL). A negative result must be combined with clinical  observations, patient history, and epidemiological information. If result is POSITIVE SARS-CoV-2 target nucleic acids are DETECTED. The SARS-CoV-2 RNA is generally detectable in upper and lower  respiratory specimens dur ing the acute phase of infection.  Positive  results are indicative of active infection with SARS-CoV-2.  Clinical  correlation with patient history and other diagnostic information is  necessary to determine patient infection status.  Positive results do  not rule out bacterial infection or co-infection with other viruses. If result is PRESUMPTIVE POSTIVE SARS-CoV-2 nucleic acids MAY BE PRESENT.   A presumptive positive result was obtained on the submitted specimen  and confirmed on repeat testing.  While 2019 novel coronavirus  (SARS-CoV-2) nucleic acids may be present in the submitted sample  additional confirmatory testing may be necessary for epidemiological  and / or clinical management purposes  to differentiate between  SARS-CoV-2 and other Sarbecovirus currently known to infect humans.  If clinically indicated additional testing with an alternate test  methodology [phone]) is advised. The SARS-CoV-2 RNA is generally  detectable in upper and lower respiratory sp ecimens during the acute  phase of infection. The expected result is Negative. Fact Sheet for Patients:  StrictlyIdeas.no Fact Sheet for Healthcare  Providers: BankingDealers.co.za This test is not yet approved or cleared by the [place] FDA and has been authorized for detection and/or diagnosis of SARS-CoV-2 by FDA under an Emergency Use Authorization (EUA).  This EUA will remain in effect (meaning this test can be used) for the duration of the COVID-19 declaration under Section 564(b)(1) of the Act, 21 U.S.C. section 360bbb-3(b)(1), unless the authorization is terminated or revoked sooner. Performed at [facility], [address], [place], [place] [address]      Time coordinating discharge: 35 minutes  SIGNED:   Cordelia Poche, MD [facility] 06/17/2019, 3:53 PM

## 2019-06-17 NOTE — Progress Notes (Signed)
Physical Therapy Treatment Patient Details Name: [name] MRN: [id number] DOB: [birth year] Today's Date: 06/17/2019    History of Present Illness 83yo male brought to the ED due to garbled speech and confusion/AMS. 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 Not a tpa candidate per neuro. PMH CAD, DM, gout, HOH, HTN, anxiety, CABG, hx R TKR    PT Comments    Pt supine in best listing to the L side of his bed.  Pt performed supine exercises with max verbal and tactile cues and intermittent success on completing reps.  He has the most difficulty with isometrics.  Pt is slow to process but did stand and pivot with face to face assistance.  He presents with posterior lean and able to  Take shuffling steps from bed to recliner.  Performed repeated sit to stand for pericare.  Pt is requiring mod to max assistance.  Continue to recommend aggressive CIR to improve strength and function before returning home.       Follow Up Recommendations  CIR     Equipment Recommendations  Rolling walker with 5" wheels;3in1 (PT)    Recommendations for Other Services Rehab consult     Precautions / Restrictions Precautions Precautions: Fall;Other (comment) Precaution Comments: HOH, seems to also have a mix of expressive difficult and confusion Restrictions Weight Bearing Restrictions: No    Mobility  Bed Mobility Overal bed mobility: Needs Assistance Bed Mobility: Supine to Sit     Supine to sit: Mod assist     General bed mobility comments: Mod assistance to advance LEs to edge of bed and to elevate trunk into a seated position.  Transfers Overall transfer level: Needs assistance Equipment used: Rolling walker (2 wheeled) Transfers: Sit to/from Stand Sit to Stand: Mod assist Stand pivot transfers: Max assist       General transfer comment: Pt performed face to face transfer with posterior bias.  He was able  to perform steps away from bed toward his chair before sitting.  Ambulation/Gait Ambulation/Gait assistance: Max assist Gait Distance (Feet): 4 Feet Assistive device: None Gait Pattern/deviations: Shuffle;Leaning posteriorly     General Gait Details: Side steps to the R from bed to recliner chair.   Stairs             Wheelchair Mobility    Modified Rankin (Stroke Patients Only)       Balance Overall balance assessment: Needs assistance Sitting-balance support: Bilateral upper extremity supported;Feet supported Sitting balance-Leahy Scale: Good       Standing balance-Leahy Scale: Poor                              Cognition Arousal/Alertness: Awake/alert Behavior During Therapy: Impulsive Overall Cognitive Status: Impaired/Different from baseline Area of Impairment: Problem solving;Safety/judgement;Following commands                       Following Commands: Follows one step commands inconsistently;Follows one step commands with increased time Safety/Judgement: Decreased awareness of safety;Decreased awareness of deficits   Problem Solving: Slow processing;Difficulty sequencing;Decreased initiation;Requires verbal cues;Requires tactile cues        Exercises General Exercises - Lower Extremity Ankle Circles/Pumps: AROM;Both;20 reps;Supine Quad Sets: AROM;Both;5 reps;Supine Long Arc Quad: AROM;Both;10 reps;Seated Hip Flexion/Marching: AROM;Both;10 reps;Seated    General Comments        Pertinent Vitals/Pain Pain Assessment: Faces Faces Pain  Scale: No hurt    Home Living                      Prior Function            PT Goals (current goals can now be found in the care plan section) Acute Rehab PT Goals Patient Stated Goal: To get a new gown. Time For Goal Achievement: 06/25/19 Progress towards PT goals: Progressing toward goals    Frequency    Min 4X/week      PT Plan Current plan remains appropriate     Co-evaluation              AM-PAC PT "6 Clicks" Mobility   Outcome Measure  Help needed turning from your back to your side while in a flat bed without using bedrails?: A Little Help needed moving from lying on your back to sitting on the side of a flat bed without using bedrails?: A Lot Help needed moving to and from a bed to a chair (including a wheelchair)?: A Lot Help needed standing up from a chair using your arms (e.g., wheelchair or bedside chair)?: A Lot Help needed to walk in hospital room?: A Lot Help needed climbing 3-5 steps with a railing? : Total 6 Click Score: 12    End of Session Equipment Utilized During Treatment: Gait belt Activity Tolerance: Patient tolerated treatment well Patient left: in chair;with call bell/phone within reach;with chair alarm set Nurse Communication: Mobility status;Need for lift equipment PT Visit Diagnosis: Unsteadiness on feet (R26.81);Muscle weakness (generalized) (M62.81);Difficulty in walking, not elsewhere classified (R26.2);Other symptoms and signs involving the nervous system (R29.898)     Time: 8270-7867 PT Time Calculation (min) (ACUTE ONLY): 26 min  Charges:  $Therapeutic Exercise: 8-22 mins $Therapeutic Activity: 8-22 mins                     [name], PTA Acute Rehabilitation Services Pager [phone] Office [phone]     Carlie [facility] Hose 06/17/2019, 12:07 PM

## 2019-06-17 NOTE — Progress Notes (Signed)
Inpatient Rehabilitation  Patient information reviewed and entered into eRehab system by Benjamim Harnish M. Yahaira Bruski, M.A., CCC/SLP, PPS Coordinator.  Information including medical coding, functional ability and quality indicators will be reviewed and updated through discharge.    

## 2019-06-17 NOTE — Progress Notes (Signed)
Patient admitted to 36M04. Patient arrived to unit via Bed accompanied by RN and NT. Care of Patient transferred to oncoming RN.

## 2019-06-17 NOTE — H&P (Addendum)
Physical Medicine and Rehabilitation Admission H&P    Chief Complaint  Patient presents with  . Code Stroke  : HPI: [name] is an 83 year old right-handed male with history of CAD with CABG 1997 maintained on aspirin, hard of hearing, CKD stage III, diabetes mellitus, hypertension, asthma.  History taken from chart review and wife due to cognition. Presented 06/10/2019 with recent fall 10 days ago without loss of consciousness.  Family notes decrease in balance as well as bouts of nausea.  Urine drug screen positive benzos, sodium 132, creatinine 1.98, lactic acid within normal limits alcohol level negative, COVID negative.  Cranial CT scan negative for any acute intracranial abnormalities.  Chronic small vessel ischemic with small chronic infarcts.  CT angiogram head and neck occluded right vertebral artery.  80% proximal left ICA stenosis.  MRI showing small medial right thalamic and internal capsule infarcts. Carotid Dopplers did show 67.9% right ICA and 67.9% left ICA stenosis.  Echocardiogram with EF of 65%. Hospital course further complicated by AMS. Neurology consulted placed on aspirin 81 mg daily plus Plavix 75 mg daily x3 months then Plavix alone.  Subcutaneous Lovenox for DVT prophylaxis.  Plan is to follow up as outpatient with vascular surgery for carotid stenosis.  Tolerating a regular consistency diet.  Therapy evaluation completed and patient was admitted for a comprehensive rehab program. Case discussed with authorizing MD. Please see preadmission assessment from today as well.    Review of Systems  Unable to perform ROS: Mental acuity   Past Medical History:  Diagnosis Date  . Anxiety   . Arthritis   . Asthma   . Coronary artery disease   . Diabetes mellitus without complication ([place])   . Gout   . HOH (hard of hearing) bilateral  . Hypertension   . PONV (postoperative nausea and vomiting)   . Right thalamic infarction ([place]) 06/17/2019   Past Surgical History:   Procedure Laterality Date  . CATARACT EXTRACTION W/PHACO  11/28/2011   Procedure: CATARACT EXTRACTION PHACO AND INTRAOCULAR LENS PLACEMENT (IOC);  Surgeon: Williams Che, MD;  Location: [facility];  Service: Ophthalmology;  Laterality: Right;  CDE 3.84  . CORONARY ARTERY BYPASS GRAFT  1997   6 vessels  . EYE SURGERY  1995   left eye cataract removal   . TOTAL KNEE  PROSTHESIS REMOVAL W/ SPACER INSERTION  8 yrs ago   right   Family History  Problem Relation Age of Onset  . Anesthesia problems Neg Hx   . Hypotension Neg Hx   . Malignant hyperthermia Neg Hx   . Pseudochol deficiency Neg Hx    Social History:  reports that he has never smoked. He has never used smokeless tobacco. He reports that he does not drink alcohol or use drugs. Allergies:  Allergies  Allergen Reactions  . Nsaids Nausea And Vomiting   Medications Prior to Admission  Medication Sig Dispense Refill  . albuterol (VENTOLIN HFA) 108 (90 Base) MCG/ACT inhaler Inhale 1-2 puffs into the lungs every 6 (six) hours as needed for wheezing or shortness of breath.     . allopurinol (ZYLOPRIM) 300 MG tablet Take 300 mg by mouth at bedtime.     . ALPRAZolam (XANAX) 0.5 MG tablet Take 0.25-0.5 mg by mouth 3 (three) times daily as needed for anxiety.     [name] amLODipine (NORVASC) 5 MG tablet Take 5 mg by mouth every morning.     [name] aspirin EC 81 MG EC tablet Take 1 tablet (  81 mg total) by mouth daily. Take for three months. 30 tablet 0  . atenolol (TENORMIN) 100 MG tablet Take 1 tablet (100 mg total) by mouth every morning.    [name] atorvastatin (LIPITOR) 40 MG tablet Take 20 mg by mouth at bedtime.     . cholecalciferol (VITAMIN D3) 25 MCG (1000 UT) tablet Take 1,000 Units by mouth every evening.    . clopidogrel (PLAVIX) 75 MG tablet Take 1 tablet (75 mg total) by mouth daily. 30 tablet 0  . co-enzyme Q-10 30 MG capsule Take 30 mg by mouth at bedtime.    . famotidine (PEPCID) 20 MG tablet Take 1 tablet (20 mg total) by mouth daily. 30  tablet 0  . furosemide (LASIX) 20 MG tablet Take 20 mg by mouth every morning. *May take as needed for fluid retention    . glipiZIDE (GLUCOTROL) 5 MG tablet Take 5 mg by mouth every morning.     [name] lisinopril (ZESTRIL) 5 MG tablet Take 1 tablet (5 mg total) by mouth every morning.    . loratadine (CLARITIN) 10 MG tablet Take 10 mg by mouth daily.    . Lutein 20 MG CAPS Take 1 capsule by mouth every morning.     . nystatin cream (MYCOSTATIN) Apply 1 application topically daily as needed for dry skin ((genital areas)).    [name] ondansetron (ZOFRAN ODT) 4 MG disintegrating tablet Take 1 tablet (4 mg total) by mouth every 8 (eight) hours as needed for nausea or vomiting. 10 tablet 0  . theophylline (THEODUR) 300 MG 12 hr tablet Take 300 mg by mouth 2 (two) times daily.    [name] triamcinolone cream (KENALOG) 0.1 % Apply 1 application topically daily as needed (for irritation (face)).     . vitamin B-12 (CYANOCOBALAMIN) 250 MCG tablet Take 250 mcg by mouth every evening.    . vitamin C (ASCORBIC ACID) 500 MG tablet Take 500 mg by mouth every evening.    [name] VITAMIN E COMPLEX PO Take 1 capsule by mouth every evening.      Drug Regimen Review Drug regimen was reviewed and remains appropriate with no significant issues identified  Home: Home Living Family/patient expects to be discharged to:: Skilled nursing facility Available Help at Discharge: Family Type of Home: (Lives at home- unsure of type) Additional Comments: patient not able to provide information, family not present to clarify  Lives With: Spouse   Functional History: Prior Function Level of Independence: Independent Comments: wife reports he did everything for himself  Functional Status:  Mobility: Bed Mobility Overal bed mobility: Needs Assistance Bed Mobility: Supine to Sit Supine to sit: Mod assist Sit to supine: Mod assist General bed mobility comments: Mod assistance to advance LEs to edge of bed and to elevate trunk into a seated  position. Transfers Overall transfer level: Needs assistance Equipment used: Rolling walker (2 wheeled) Transfers: Sit to/from Stand Sit to Stand: Mod assist Stand pivot transfers: Max assist General transfer comment: Pt performed face to face transfer with posterior bias.  He was able to perform steps away from bed toward his chair before sitting. Ambulation/Gait Ambulation/Gait assistance: Max assist Gait Distance (Feet): 4 Feet Assistive device: None Gait Pattern/deviations: Shuffle, Leaning posteriorly General Gait Details: Side steps to the R from bed to recliner chair. Gait velocity: Decreased    ADL: ADL Overall ADL's : Needs assistance/impaired Eating/Feeding: Maximal assistance Grooming: Maximal assistance Grooming Details (indicate cue type and reason): pt was able to remove dentures. pt was able  to brush teeth mod (A). pt was able to wash face min (A). pt was more automatic with adls.  Upper Body Bathing: Maximal assistance Lower Body Bathing: Total assistance Upper Body Dressing : Maximal assistance Lower Body Dressing: Total assistance Toilet Transfer: +2 for physical assistance, Maximal assistance Functional mobility during ADLs: +2 for physical assistance, Maximal assistance General ADL Comments: pt progresed from bed to sink (5 steps and then required sitting) pt with L knee sleeve for compression present int he room. Wife present throughout session  Cognition: Cognition Overall Cognitive Status: Impaired/Different from baseline Arousal/Alertness: Awake/alert Orientation Level: Oriented to person, Oriented to place, Disoriented to time, Disoriented to situation Attention: Focused, Sustained Focused Attention: Impaired Focused Attention Impairment: Verbal basic Sustained Attention: Impaired Sustained Attention Impairment: Verbal complex Cognition Arousal/Alertness: Awake/alert Behavior During Therapy: Impulsive Overall Cognitive Status: Impaired/Different  from baseline Area of Impairment: Problem solving, Safety/judgement, Following commands Orientation Level: Disoriented to, Place, Time, Situation Current Attention Level: Sustained Memory: Decreased short-term memory Following Commands: Follows one step commands inconsistently, Follows one step commands with increased time Safety/Judgement: Decreased awareness of safety, Decreased awareness of deficits Awareness: Intellectual Problem Solving: Slow processing, Difficulty sequencing, Decreased initiation, Requires verbal cues, Requires tactile cues General Comments: very slow processing this afternoon, almost seems like he was sundowning  Physical Exam: Blood pressure (!) 175/72, pulse 80, temperature 99.6 F (37.6 C), temperature source Oral, resp. rate 17, height 5\' 10"  (1.778 m), weight 84.6 kg, SpO2 99 %. Physical Exam  Vitals reviewed. Constitutional: He appears well-developed and well-nourished.  HENT:  Head: Normocephalic and atraumatic.  Eyes: EOM are normal. Right eye exhibits no discharge. Left eye exhibits no discharge.  Neck: No tracheal deviation present. No thyromegaly present.  Respiratory: Effort normal. No respiratory distress.  GI: He exhibits no distension.  Musculoskeletal:        General: Deformity (LUE congenital) present. No tenderness or edema.  Neurological: He is alert.  Oriented to person only. HOH Motor: Limited due to ability to follow commands Moving LUE freely RUE limited due to congenital deficit Limited spontaneous movements b/l LE  Skin: Skin is warm and dry.  Psychiatric:  Limited due to cognition    Results for orders placed or performed during the hospital encounter of 06/10/19 (from the past 48 hour(s))  Glucose, capillary     Status: Abnormal   Collection Time: 06/15/19  9:25 PM  Result Value Ref Range   Glucose-Capillary 166 (H) 70 - 99 mg/dL  Glucose, capillary     Status: None   Collection Time: 06/16/19  6:16 AM  Result Value Ref  Range   Glucose-Capillary 95 70 - 99 mg/dL  Glucose, capillary     Status: Abnormal   Collection Time: 06/16/19 11:24 AM  Result Value Ref Range   Glucose-Capillary 149 (H) 70 - 99 mg/dL  Glucose, capillary     Status: Abnormal   Collection Time: 06/16/19  4:13 PM  Result Value Ref Range   Glucose-Capillary 161 (H) 70 - 99 mg/dL  Glucose, capillary     Status: Abnormal   Collection Time: 06/16/19  9:14 PM  Result Value Ref Range   Glucose-Capillary 119 (H) 70 - 99 mg/dL  Glucose, capillary     Status: Abnormal   Collection Time: 06/17/19  6:37 AM  Result Value Ref Range   Glucose-Capillary 127 (H) 70 - 99 mg/dL   Comment 1 Notify RN    Comment 2 Document in Chart   Glucose, capillary  Status: Abnormal   Collection Time: 06/17/19 12:03 PM  Result Value Ref Range   Glucose-Capillary 137 (H) 70 - 99 mg/dL   Comment 1 Notify RN    Comment 2 Document in Chart   Glucose, capillary     Status: Abnormal   Collection Time: 06/17/19  3:42 PM  Result Value Ref Range   Glucose-Capillary 142 (H) 70 - 99 mg/dL   Comment 1 Notify RN    Comment 2 Document in Chart    No results found.     Medical Problem List and Plan: 1.  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  Old right basal ganglia and left occipital cortex infarction  Admit to CIR. 2.  Antithrombotics: -DVT/anticoagulation: Lovenox  -antiplatelet therapy: Aspirin 81 mg daily, Plavix 75 mg daily x3 months then Plavix alone 3. Pain Management: Tylenol as needed 4. Mood: Provide emotional support  -antipsychotic agents: N/A 5. Neuropsych: This patient is not capable of making decisions on his own behalf. 6. Skin/Wound Care: Routine skin checks 7. Fluids/Electrolytes/Nutrition: Routine in and outs  CMP ordered for tomorrow 8.  Bilateral carotid stenosis.  Follow-up vascular surgery as outpatient. 9.  Diabetes mellitus.  Hemoglobin A1c  5.8.  Glucotrol 5 mg daily.  Monitor with increased mobility.  10.  Permissive hypertension.  Presently on Norvasc 5mg  daily.  Patient on Tenormin 100 mg daily and lisinopril 5 mg daily prior to admission.  Resume as needed  Monitor with increased mobility.  11.  CKD stage III.  Labs ordered for tomorrow AM.  12.  Hyperlipidemia.  Lipitor 13.  CAD with CABG 1997.  No chest pain or shortness of breath.  Continue aspirin and Plavix 14.  History of gout.  Zyloprim 300 mg daily.  Monitor for gout flareups 15.  Hard of hearing. 16.  History of asthma.  Monitor for increased shortness of breath.  Patient on albuterol inhaler as needed as well as theophylline 300 mg twice daily prior to admission.  Lavon Paganini Angiulli, PA-C 06/17/2019   I have personally performed a face to face diagnostic evaluation, including, but not limited to relevant history and physical exam findings, of this patient and developed relevant assessment and plan.  Additionally, I have reviewed and concur with the physician assistant's documentation above.  Delice Lesch, MD, ABPMR  The patient's status has not changed. The original post admission physician evaluation remains appropriate, and any changes from the pre-admission screening or documentation from the acute chart are noted above.   Delice Lesch, MD, ABPMR

## 2019-06-17 NOTE — IPOC Note (Signed)
Individualized overall Plan of Care [facility]) Patient Details Name: [name] MRN: [id number] DOB: September 17, 1935  Admitting Diagnosis: Right thalamic infarction [facility])  Hospital Problems: Principal Problem:   Right thalamic infarction [facility]) Active Problems:   CVA (cerebral vascular accident) ([place])   Acute blood loss anemia   Hypoalbuminemia due to protein-calorie malnutrition ([place])   Controlled type 2 diabetes mellitus with hyperglycemia, without long-term current use of insulin ([place])     Functional Problem List: Nursing Behavior, Endurance, Safety  PT Balance, Endurance, Motor  OT Balance, Cognition, Endurance, Motor, Perception  SLP Cognition  TR         Basic ADL's: OT Eating, Grooming, Bathing, Dressing, Toileting     Advanced  ADL's: OT       Transfers: PT Bed Mobility, Bed to Chair, Car, Manufacturing systems engineer, Metallurgist: PT Ambulation, Stairs     Additional Impairments: OT None  SLP Communication, Social Cognition comprehension Attention, Awareness, Memory, Problem Solving  TR      Anticipated Outcomes Item Anticipated Outcome  Self Feeding Independent  Swallowing      Basic self-care  Supervision  Toileting  Supervision   Bathroom Transfers Supervision  Bowel/Bladder  Pt wil remain continent of bowel and bladder.  Transfers  supervsion  Locomotion  CGA  Communication  Mod-Min A  Cognition  Mod-Min A  Pain  Patient will remain free of pain.  Safety/Judgment  Patient will be free from falls with injury.   Therapy Plan: PT Intensity: Minimum of 1-2 x/day ,45 to 90 minutes PT Frequency: 5 out of 7 days PT Duration Estimated Length of Stay: 2 weeks OT Intensity: Minimum of 1-2 x/day, 45 to 90 minutes OT Frequency: 5 out of 7 days OT Duration/Estimated Length of Stay: 14 -16 days SLP Intensity: Minumum of 1-2 x/day, 30 to 90 minutes SLP Frequency: 3 to 5 out of 7 days SLP Duration/Estimated Length of Stay: 14-16 days     Team Interventions: Nursing Interventions Patient/Family Education, Bladder Management, Bowel Management, Disease Management/Prevention, Medication Management, Skin Care/Wound Management, Cognitive Remediation/Compensation, Discharge Planning, Psychosocial Support  PT interventions Ambulation/gait training, Discharge planning, Functional mobility training, Therapeutic Activities, Balance/vestibular training, Disease management/prevention, Neuromuscular re-education, Therapeutic Exercise, DME/adaptive equipment instruction, Pain management, UE/LE Strength taining/ROM, Community reintegration, Barrister's clerk education, IT trainer, UE/LE Coordination activities, Functional electrical stimulation  OT Interventions Training and development officer, Neuromuscular re-education, Self Care/advanced ADL retraining, Therapeutic Exercise, UE/LE Strength taining/ROM, DME/adaptive equipment instruction, Cognitive remediation/compensation, Patient/family education, UE/LE Coordination activities, Visual/perceptual remediation/compensation, Therapeutic Activities, Psychosocial support, Functional mobility training, Discharge planning  SLP Interventions Cognitive remediation/compensation, Cueing hierarchy, Functional tasks, Environmental controls, Internal/external aids, Speech/Language facilitation, Patient/family education  TR Interventions    SW/CM Interventions     Barriers to Discharge MD  Medical stability and Behavior  Nursing Behavior Patient presents with coginitive impairment and poor safety behaviors.  PT      OT      SLP      SW       Team Discharge Planning: Destination: PT-Home ,OT- Home , SLP-Home Projected Follow-up: PT-Home health PT, OT-  Home health OT, SLP-24 hour supervision/assistance, Home Health SLP Projected Equipment Needs: PT-To be determined, OT- Tub/shower seat, SLP-None recommended by SLP Equipment Details: PT- , OT-  Patient/family involved in discharge planning: PT-  Patient,  OT-Patient unable/family or caregiver not available, SLP-Patient, Family member/caregiver  MD ELOS: 13-17 days. Medical Rehab Prognosis:  Good Assessment: 83 year old right-handed male with history of CAD with CABG 1997 maintained  on aspirin, hard of hearing, CKD stage III, diabetes mellitus, hypertension, asthma.  Presented 06/10/2019 with recent fall 10 days ago without loss of consciousness.  Family notes decrease in balance as well as bouts of nausea.  Urine drug screen positive benzos, sodium 132, creatinine 1.98, lactic acid within normal limits alcohol level negative, COVID negative.  Cranial CT scan negative for any acute intracranial abnormalities.  Chronic small vessel ischemic with small chronic infarcts.  CT angiogram head and neck occluded right vertebral artery.  80% proximal left ICA stenosis.  MRI showing small medial right thalamic and internal capsule infarcts. Carotid Dopplers did show 67.9% right ICA and 67.9% left ICA stenosis.  Echocardiogram with EF of 65%. Hospital course further complicated by AMS. Neurology consulted placed on aspirin 81 mg daily plus Plavix 75 mg daily x3 months then Plavix alone.  Subcutaneous Lovenox for DVT prophylaxis.  Plan is to follow up as outpatient with vascular surgery for carotid stenosis.  Tolerating a regular consistency diet.  Patient with resulting functional deficits with weakness, endurance, cognition, self-care.  Will set goals for Supervision with PT/OT and Min/Mod A with SLP.   Due to the current state of emergency, patients may not be receiving their 3-hours of Medicare-mandated therapy.  See Team Conference Notes for weekly updates to the plan of care

## 2019-06-17 NOTE — Progress Notes (Signed)
PROGRESS NOTE    Charbel Los Fritze  GHW:[id number] DOB: March 17, 1936 DOA: 06/10/2019 PCP: [name], MD   Brief Narrative: Yarnell Arvidson Petrasek is a 83 y.o. male with medical history significant forhypertension, diabetes mellitus, asthma, coronary artery disease. Patient presented secondary to confusion and garbled speech and found to have an acute CVA.   Assessment & Plan:   Principal Problem:   Speech abnormality Active Problems:   CVA (cerebral vascular accident) ([place])   Essential hypertension   CKD (chronic kidney disease), stage III   Acute stroke MRI significant for R thalamic/internal capsule infarct CTA significant for 80% left proximal ICA stenosis and 80%/60% right distal/proximal ICA stenosi respectively; carotid dopplers with 60-79% stenosis bilaterally with non-visualized bilateral vertebral arteries. LDL of 66. Hemoglobin A1C of 5.8%. Neurology on board and recommending aspirin for 3 months in addition to Plavix indefinitely. Transthoracic Echocardiogram significant for no thrombus. -Continue aspirin, Plavix, Lipitor -PT/OT recommending CIR  Metabolic encephalopathy Home Xanax held. Patient has some waxing and waning of orientation but otherwise no longer encephalopathic. Some of this is likely secondary to patient's very poor hearing; hearing aid is now available to the patient.  Essential hypertension Holding home regimen currently secondary to acute stroke -Metoprolol prn  Diabetes mellitus, type 2 -Continue SSI  Abnormal CT Thickened esophagus seen. Likely gastritis. Will need outpatient GI follow-up.  Asthma Stable.  CAD -Continue aspirin and lipitor  CKD stage III Stable.   DVT prophylaxis: Lovenox Code Status:   Code Status: Full Code Family Communication: None at bedside Disposition Plan: Discharge to CIR. Medically stable   Consultants:   Neurology  CIR  Procedures:   Transthoracic Echocardiogram (06/12/2019) IMPRESSIONS    1. Left ventricular ejection fraction, by visual estimation, is 60 to 65%. The left ventricle has normal function. Normal left ventricular size. There is no left ventricular hypertrophy.  2. Left ventricular diastolic Doppler parameters are consistent with impaired relaxation pattern of LV diastolic filling.  3. Global right ventricle has normal systolic function.The right ventricular size is normal. No increase in right ventricular wall thickness.  4. Left atrial size was normal.  5. Right atrial size was normal.  6. Mild mitral annular calcification.  7. The mitral valve is normal in structure. Trace mitral valve regurgitation. No evidence of mitral stenosis.  8. The tricuspid valve is not well visualized. Tricuspid valve regurgitation is trivial.  9. The aortic valve was not well visualized Aortic valve regurgitation was not visualized by color flow Doppler. 10. The pulmonic valve was not well visualized. Pulmonic valve regurgitation is not visualized by color flow Doppler. 11. Technically difficult; definity used; normal LV systolic function; grade 1 diastolic dysfunction.  FINDINGS  Left Ventricle: Left ventricular ejection fraction, by visual estimation, is 60 to 65%. The left ventricle has normal function. There is no left ventricular hypertrophy. Normal left ventricular size. Spectral Doppler shows Left ventricular diastolic  Doppler parameters are consistent with impaired relaxation pattern of LV diastolic filling.  Right Ventricle: The right ventricular size is normal. Global RV systolic function is has normal systolic function.  Left Atrium: Left atrial size was normal in size.  Right Atrium: Right atrial size was normal in size  Pericardium: There is no evidence of pericardial effusion.  Mitral Valve: The mitral valve is normal in structure. Mild mitral annular calcification. No evidence of mitral valve stenosis by observation. Trace mitral valve regurgitation.  Tricuspid  Valve: The tricuspid valve is not well visualized. Tricuspid valve regurgitation is trivial by color  flow Doppler.  Aortic Valve: The aortic valve was not well visualized. Aortic valve regurgitation was not visualized by color flow Doppler.  Pulmonic Valve: The pulmonic valve was not well visualized. Pulmonic valve regurgitation is not visualized by color flow Doppler.  Aorta: The aortic root, ascending aorta and aortic arch are all structurally normal, with no evidence of dilitation or obstruction.  Venous: The inferior vena cava was not well visualized.  IAS/Shunts: No atrial level shunt detected by color flow Doppler. No ventricular septal defect is seen or detected. There is no evidence of an atrial septal defect.  Antimicrobials:  None    Subjective: No issues per patient other than wanting his arm band off.  Objective: Vitals:   06/16/19 2316 06/17/19 0427 06/17/19 0836 06/17/19 1006  BP: (!) 193/66 (!) 161/79 (!) 171/78 (!) 148/67  Pulse: 86 79 78   Resp: 18 18 12 13   Temp: 97.8 F (36.6 C) 98.3 F (36.8 C) 99.5 F (37.5 C)   TempSrc: Oral Oral Oral   SpO2: 100% 98% 98%   Weight:      Height:        Intake/Output Summary (Last 24 hours) at 06/17/2019 1112 Last data filed at 06/17/2019 0428 Gross per 24 hour  Intake 120 ml  Output 1550 ml  Net -1430 ml   Filed Weights   06/10/19 1356 06/10/19 2109  Weight: 80 kg 84.6 kg    Examination:  General exam: Appears calm and comfortable  Respiratory system: Clear to auscultation. Respiratory effort normal. Cardiovascular system: S1 & S2 heard, RRR. No murmurs, rubs, gallops or clicks. Gastrointestinal system: Abdomen is nondistended, soft and nontender. No organomegaly or masses felt. Normal bowel sounds heard. Central nervous system: Alert. Follows commands. Conversant. Oriented to person. 4/5 strength of right side. Hearing impairment Extremities: No edema. No calf tenderness. Left UE contracture of wrist and  fingers Skin: No cyanosis. No rashes  Data Reviewed: I have personally reviewed following labs and imaging studies  CBC: Recent Labs  Lab 06/10/19 1441 06/12/19 0411  WBC 8.2 7.5  NEUTROABS 5.9  --   HGB 10.2* 10.3*  HCT 32.9* 30.4*  MCV 92.4 88.4  PLT 319 732   Basic Metabolic Panel: Recent Labs  Lab 06/10/19 1441 06/12/19 0411  NA 132* 137  K 3.7 3.8  CL 100 106  CO2 22 23  GLUCOSE 215* 85  BUN 36* 22  CREATININE 1.98* 1.76*  CALCIUM 8.9 8.9   GFR: Estimated Creatinine Clearance: 34.6 mL/min (A) (by C-G formula based on SCr of 1.76 mg/dL (H)). Liver Function Tests: No results for input(s): AST, ALT, ALKPHOS, BILITOT, PROT, ALBUMIN in the last 168 hours. No results for input(s): LIPASE, AMYLASE in the last 168 hours. No results for input(s): AMMONIA in the last 168 hours. Coagulation Profile: Recent Labs  Lab 06/10/19 1441  INR 1.1   Cardiac Enzymes: No results for input(s): CKTOTAL, CKMB, CKMBINDEX, TROPONINI in the last 168 hours. BNP (last 3 results) No results for input(s): PROBNP in the last 8760 hours. HbA1C: No results for input(s): HGBA1C in the last 72 hours. CBG: Recent Labs  Lab 06/16/19 0616 06/16/19 1124 06/16/19 1613 06/16/19 2114 06/17/19 0637  GLUCAP 95 149* 161* 119* 127*   Lipid Profile: No results for input(s): CHOL, HDL, LDLCALC, TRIG, CHOLHDL, LDLDIRECT in the last 72 hours. Thyroid Function Tests: No results for input(s): TSH, T4TOTAL, FREET4, T3FREE, THYROIDAB in the last 72 hours. Anemia Panel: No results for input(s): VITAMINB12, FOLATE, FERRITIN,  TIBC, IRON, RETICCTPCT in the last 72 hours. Sepsis Labs: Recent Labs  Lab 06/10/19 1441 06/10/19 1632  LATICACIDVEN 1.5 0.9    Recent Results (from the past 240 hour(s))  SARS Coronavirus 2 [facility] order, Performed in [facility] hospital lab) Nasopharyngeal Nasopharyngeal Swab     Status: None   Collection Time: 06/10/19  6:29 PM   Specimen: Nasopharyngeal Swab   Result Value Ref Range Status   SARS Coronavirus 2 NEGATIVE NEGATIVE Final    Comment: (NOTE) If result is NEGATIVE SARS-CoV-2 target nucleic acids are NOT DETECTED. The SARS-CoV-2 RNA is generally detectable in upper and lower  respiratory specimens during the acute phase of infection. The lowest  concentration of SARS-CoV-2 viral copies this assay can detect is 250  copies / mL. A negative result does not preclude SARS-CoV-2 infection  and should not be used as the sole basis for treatment or other  patient management decisions.  A negative result may occur with  improper specimen collection / handling, submission of specimen other  than nasopharyngeal swab, presence of viral mutation(s) within the  areas targeted by this assay, and inadequate number of viral copies  (<250 copies / mL). A negative result must be combined with clinical  observations, patient history, and epidemiological information. If result is POSITIVE SARS-CoV-2 target nucleic acids are DETECTED. The SARS-CoV-2 RNA is generally detectable in upper and lower  respiratory specimens dur ing the acute phase of infection.  Positive  results are indicative of active infection with SARS-CoV-2.  Clinical  correlation with patient history and other diagnostic information is  necessary to determine patient infection status.  Positive results do  not rule out bacterial infection or co-infection with other viruses. If result is PRESUMPTIVE POSTIVE SARS-CoV-2 nucleic acids MAY BE PRESENT.   A presumptive positive result was obtained on the submitted specimen  and confirmed on repeat testing.  While 2019 novel coronavirus  (SARS-CoV-2) nucleic acids may be present in the submitted sample  additional confirmatory testing may be necessary for epidemiological  and / or clinical management purposes  to differentiate between  SARS-CoV-2 and other Sarbecovirus currently known to infect humans.  If clinically indicated additional  testing with an alternate test  methodology [id number]) is advised. The SARS-CoV-2 RNA is generally  detectable in upper and lower respiratory sp ecimens during the acute  phase of infection. The expected result is Negative. Fact Sheet for Patients:  StrictlyIdeas.no Fact Sheet for Healthcare Providers: BankingDealers.co.za This test is not yet approved or cleared by the [place] FDA and has been authorized for detection and/or diagnosis of SARS-CoV-2 by FDA under an Emergency Use Authorization (EUA).  This EUA will remain in effect (meaning this test can be used) for the duration of the COVID-19 declaration under Section 564(b)(1) of the Act, 21 U.S.C. section 360bbb-3(b)(1), unless the authorization is terminated or revoked sooner. Performed at [facility], [address], [place], [place] [address]          Radiology Studies: No results found.      Scheduled Meds: . allopurinol  300 mg Oral QHS  . amLODipine  5 mg Oral q morning - 10a  . aspirin EC  81 mg Oral Daily  . atorvastatin  20 mg Oral QHS  . clopidogrel  75 mg Oral Daily  . enoxaparin (LOVENOX) injection  40 mg Subcutaneous Q24H  . famotidine  20 mg Oral Daily  . glipiZIDE  5 mg Oral q morning - 10a  . insulin aspart  0-5 Units Subcutaneous QHS  . insulin aspart  0-9 Units Subcutaneous TID WC  . loratadine  10 mg Oral Daily   Continuous Infusions:    LOS: 6 days     Cordelia Poche, MD [facility] 06/17/2019, 11:12 AM  If 7PM-7AM, please contact night-coverage [email or web address]

## 2019-06-17 NOTE — Progress Notes (Deleted)
Inpatient Rehabilitation  Patient information reviewed and entered into eRehab system by Desmin Daleo M. Antwon Rochin, M.A., CCC/SLP, PPS Coordinator.  Information including medical coding, functional ability and quality indicators will be reviewed and updated through discharge.    

## 2019-06-18 ENCOUNTER — Inpatient Hospital Stay (HOSPITAL_COMMUNITY): Payer: Medicare Other | Admitting: Occupational Therapy

## 2019-06-18 ENCOUNTER — Inpatient Hospital Stay (HOSPITAL_COMMUNITY): Payer: Medicare Other

## 2019-06-18 DIAGNOSIS — E46 Unspecified protein-calorie malnutrition: Secondary | ICD-10-CM

## 2019-06-18 DIAGNOSIS — E8809 Other disorders of plasma-protein metabolism, not elsewhere classified: Secondary | ICD-10-CM

## 2019-06-18 DIAGNOSIS — N183 Chronic kidney disease, stage 3 unspecified: Secondary | ICD-10-CM

## 2019-06-18 DIAGNOSIS — I639 Cerebral infarction, unspecified: Secondary | ICD-10-CM

## 2019-06-18 DIAGNOSIS — E1165 Type 2 diabetes mellitus with hyperglycemia: Secondary | ICD-10-CM

## 2019-06-18 DIAGNOSIS — D62 Acute posthemorrhagic anemia: Secondary | ICD-10-CM

## 2019-06-18 DIAGNOSIS — I10 Essential (primary) hypertension: Secondary | ICD-10-CM

## 2019-06-18 DIAGNOSIS — I63032 Cerebral infarction due to thrombosis of left carotid artery: Secondary | ICD-10-CM

## 2019-06-18 LAB — CBC WITH DIFFERENTIAL/PLATELET
Abs Immature Granulocytes: 0.02 10*3/uL (ref 0.00–0.07)
Basophils Absolute: 0 10*3/uL (ref 0.0–0.1)
Basophils Relative: 1 %
Eosinophils Absolute: 0.2 10*3/uL (ref 0.0–0.5)
Eosinophils Relative: 3 %
HCT: 30.3 % — ABNORMAL LOW (ref 39.0–52.0)
Hemoglobin: 9.8 g/dL — ABNORMAL LOW (ref 13.0–17.0)
Immature Granulocytes: 0 %
Lymphocytes Relative: 18 %
Lymphs Abs: 0.9 10*3/uL (ref 0.7–4.0)
MCH: 28.9 pg (ref 26.0–34.0)
MCHC: 32.3 g/dL (ref 30.0–36.0)
MCV: 89.4 fL (ref 80.0–100.0)
Monocytes Absolute: 0.5 10*3/uL (ref 0.1–1.0)
Monocytes Relative: 10 %
Neutro Abs: 3.6 10*3/uL (ref 1.7–7.7)
Neutrophils Relative %: 68 %
Platelets: 393 10*3/uL (ref 150–400)
RBC: 3.39 MIL/uL — ABNORMAL LOW (ref 4.22–5.81)
RDW: 14.4 % (ref 11.5–15.5)
WBC: 5.3 10*3/uL (ref 4.0–10.5)
nRBC: 0 % (ref 0.0–0.2)

## 2019-06-18 LAB — COMPREHENSIVE METABOLIC PANEL
ALT: 11 U/L (ref 0–44)
AST: 14 U/L — ABNORMAL LOW (ref 15–41)
Albumin: 2.7 g/dL — ABNORMAL LOW (ref 3.5–5.0)
Alkaline Phosphatase: 41 U/L (ref 38–126)
Anion gap: 11 (ref 5–15)
BUN: 37 mg/dL — ABNORMAL HIGH (ref 8–23)
CO2: 21 mmol/L — ABNORMAL LOW (ref 22–32)
Calcium: 9.1 mg/dL (ref 8.9–10.3)
Chloride: 105 mmol/L (ref 98–111)
Creatinine, Ser: 1.81 mg/dL — ABNORMAL HIGH (ref 0.61–1.24)
GFR calc Af Amer: 40 mL/min — ABNORMAL LOW (ref >60)
GFR calc non Af Amer: 35 mL/min — ABNORMAL LOW (ref >60)
Glucose, Bld: 116 mg/dL — ABNORMAL HIGH (ref 70–99)
Potassium: 4.3 mmol/L (ref 3.5–5.1)
Sodium: 137 mmol/L (ref 135–145)
Total Bilirubin: 0.7 mg/dL (ref 0.3–1.2)
Total Protein: 6.4 g/dL — ABNORMAL LOW (ref 6.5–8.1)

## 2019-06-18 LAB — GLUCOSE, CAPILLARY
Glucose-Capillary: 100 mg/dL — ABNORMAL HIGH (ref 70–99)
Glucose-Capillary: 121 mg/dL — ABNORMAL HIGH (ref 70–99)
Glucose-Capillary: 139 mg/dL — ABNORMAL HIGH (ref 70–99)
Glucose-Capillary: 175 mg/dL — ABNORMAL HIGH (ref 70–99)

## 2019-06-18 MED ORDER — INFLUENZA VAC A&B SA ADJ QUAD 0.5 ML IM PRSY
0.5000 mL | PREFILLED_SYRINGE | Freq: Once | INTRAMUSCULAR | Status: AC
  Administered 2019-06-18: 0.5 mL via INTRAMUSCULAR
  Filled 2019-06-18: qty 0.5

## 2019-06-18 NOTE — Evaluation (Signed)
Physical Therapy Assessment and Plan  Patient Details  Name: [name] MRN: [id number] Date of Birth: Jan 28, 1936  PT Diagnosis: Abnormal posture, Abnormality of gait, Contracture of joint: L elbow, Difficulty walking, Impaired cognition and Muscle weakness Rehab Potential: Good ELOS: 2 weeks   Today's Date: 06/18/2019 PT Individual Time: 3151-7616 PT Individual Time Calculation (min): 73 min    Problem List:  Patient Active Problem List   Diagnosis Date Noted  . Acute blood loss anemia   . Hypoalbuminemia due to protein-calorie malnutrition ([place])   . Controlled type 2 diabetes mellitus with hyperglycemia, without long-term current use of insulin ([place])   . Right thalamic infarction ([place]) 06/17/2019  . Bilateral hearing loss   . Coronary artery disease involving coronary bypass graft of native heart without angina pectoris   . Diabetes mellitus type 2 in nonobese ([facility])   . Bilateral carotid artery stenosis   . History of CVA with residual deficit   . Essential hypertension 06/12/2019  . CKD (chronic kidney disease), stage III 06/12/2019  . CVA (cerebral vascular accident) ([place]) 06/11/2019  . Speech abnormality 06/10/2019    Past Medical History:  Past Medical History:  Diagnosis Date  . Anxiety   . Arthritis   . Asthma   . Coronary artery disease   . Diabetes mellitus without complication ([place])   . Gout   . HOH (hard of hearing) bilateral  . Hypertension   . PONV (postoperative nausea and vomiting)   . Right thalamic infarction [facility]) 06/17/2019   Past Surgical History:  Past Surgical History:  Procedure Laterality Date  . CATARACT EXTRACTION W/PHACO  11/28/2011   Procedure: CATARACT EXTRACTION PHACO AND INTRAOCULAR LENS PLACEMENT (IOC);  Surgeon: Williams Che, MD;  Location: [facility];  Service: Ophthalmology;  Laterality: Right;  CDE 3.84  . CORONARY ARTERY BYPASS GRAFT  1997   6 vessels  . EYE SURGERY  1995   left eye cataract removal   . TOTAL KNEE   PROSTHESIS REMOVAL W/ SPACER INSERTION  8 yrs ago   right    Assessment & Plan Clinical Impression: Patient is a 83 y.o. year old male with history of CAD with CABG 1997 maintained on aspirin, hard of hearing, CKD stage III, diabetes mellitus, hypertension, asthma.  History taken from chart review and wife due to cognition. Presented 06/10/2019 with recent fall 10 days ago without loss of consciousness.  Family notes decrease in balance as well as bouts of nausea.  Urine drug screen positive benzos, sodium 132, creatinine 1.98, lactic acid within normal limits alcohol level negative, COVID negative.  Cranial CT scan negative for any acute intracranial abnormalities.  Chronic small vessel ischemic with small chronic infarcts. CT angiogram head and neck occluded right vertebral artery.  80% proximal left ICA stenosis.  MRI showing small medial right thalamic and internal capsule infarcts. Carotid Dopplers did show 67.9% right ICA and 67.9% left ICA stenosis.  Echocardiogram with EF of 65%. Hospital course further complicated by AMS. Neurology consulted placed on aspirin 81 mg daily plus Plavix 75 mg daily x3 months then Plavix alone.  Subcutaneous Lovenox for DVT prophylaxis.  Plan is to follow up as outpatient with vascular surgery for carotid stenosis.  Tolerating a regular consistency diet. Therapy evaluation completed and patient was admitted for a comprehensive rehab program. Case discussed with authorizing MD. Please see preadmission assessment from today as well. Patient transferred to CIR on 06/17/2019 .   Patient currently requires mod with mobility secondary to muscle  weakness and muscle joint tightness, decreased initiation, decreased attention, decreased awareness, decreased safety awareness, decreased memory and delayed processing and decreased sitting balance, decreased standing balance, decreased postural control and decreased balance strategies.  Prior to hospitalization, patient was independent   with mobility and lived with Spouse(Linda) in a House home.  Home access is 2Stairs to enter.  Patient will benefit from skilled PT intervention to maximize safe functional mobility, minimize fall risk and decrease caregiver burden for planned discharge home with 24 hour supervision.  Anticipate patient will benefit from follow up [place] at discharge.  PT - End of Session Activity Tolerance: Tolerates 30+ min activity with multiple rests Endurance Deficit: Yes Endurance Deficit Description: Fatigue with gait and bed mobility PT Assessment Rehab Potential (ACUTE/IP ONLY): Good PT Patient demonstrates impairments in the following area(s): Balance;Endurance;Motor PT Transfers Functional Problem(s): Bed Mobility;Bed to Chair;Car;Furniture PT Locomotion Functional Problem(s): Ambulation;Stairs PT Plan PT Intensity: Minimum of 1-2 x/day ,45 to 90 minutes PT Frequency: 5 out of 7 days PT Duration Estimated Length of Stay: 2 weeks PT Treatment/Interventions: Ambulation/gait training;Discharge planning;Functional mobility training;Therapeutic Activities;Balance/vestibular training;Disease management/prevention;Neuromuscular re-education;Therapeutic Exercise;DME/adaptive equipment instruction;Pain management;UE/LE Strength taining/ROM;Community reintegration;Patient/family education;Stair training;UE/LE Coordination activities;Functional electrical stimulation PT Transfers Anticipated Outcome(s): supervsion PT Locomotion Anticipated Outcome(s): CGA PT Recommendation Follow Up Recommendations: Home health PT Patient destination: Home Equipment Recommended: To be determined  Skilled Therapeutic Intervention Evaluation completed (see details above and below) with education on PT POC and goals and individual treatment initiated with focus on functional transfers and mobility, ambulation, stair navigation, RW safety, balance/coordination, and improved tolerance to activity. Pt denied any pain during today's  session. Pt demonstrated delayed initiation with all tasks during today's session. Pt transferred sit<>stand with RW mod A (occasionally min A) and verbal cues for hand placement. Pt navigated 1 step mod A with 1 rail on R and required verbal cues for hand placement, step pattern, and step sequence. Pt performed car transfer mod A with no AD and required verbal cues for hand placement and turning to prevent fall. Pt ambulated 72f with RW mod A and verbal cues for foot clearance and step length on R, weight shifting to the L, and remaining close to RW for safety. Pt performed all bed mobility with mod A for LE management and to push up with RUE. Pt required max verbal cueing to roll, for hand placement, and LE placement. Concluded session with pt sitting in chair, all needs within reach, chair alarm on, and safety plan updated.    PT Evaluation Precautions/Restrictions Precautions Precautions: Fall Precaution Comments: HOH, delayed initiation for tasks Restrictions Weight Bearing Restrictions: No General   Vital Signs Pain Pain Assessment Pain Score: 0-No pain Home Living/Prior Functioning Home Living Available Help at Discharge: Family Type of Home: House Home Access: Stairs to enter ETechnical brewerof Steps: 2 Entrance Stairs-Rails: Can reach both Home Layout: One level Bathroom Shower/Tub: Walk-in shower Additional Comments: patient not able to provide information, family not present to clarify  Lives With: Spouse(Linda) Prior Function Level of Independence: Independent with basic ADLs;Independent with gait Driving: Yes Vocation: Retired Comments: hisotry per chart review, need to follow up with wife Vision/Perception  Perception Perception: Impaired(to be assessed further. On both his watch and wall clock he reversed the hour/ minute hand to state the incorrect time.) Praxis Praxis: Impaired Praxis Impairment Details: Motor planning Praxis-Other Comments: min impaired  with mobility  Cognition Overall Cognitive Status: Impaired/Different from baseline Arousal/Alertness: Awake/alert Orientation Level: Disoriented to place;Disoriented to situation Attention: Focused;Sustained  Focused Attention: Impaired Sustained Attention: Impaired Memory: Impaired Memory Impairment: Retrieval deficit;Storage deficit;Decreased recall of new information Immediate Memory Recall: Sock;Blue;Bed Memory Recall Sock: With Cue Memory Recall Blue: Without Cue Memory Recall Bed: With Cue Awareness: Impaired Awareness Impairment: Intellectual impairment;Emergent impairment(unable to state why he was in the hospital) Problem Solving: Impaired Problem Solving Impairment: Verbal basic;Functional basic Safety/Judgment: Impaired Comments: Requires cues for hand placement with transfers Sensation Sensation Light Touch: Appears Intact Hot/Cold: Appears Intact Proprioception: Appears Intact Stereognosis: Appears Intact Additional Comments: grossly intact, bilateral LE's Coordination Gross Motor Movements are Fluid and Coordinated: No Fine Motor Movements are Fluid and Coordinated: No Coordination and Movement Description: Poor postural control, decreased motor coordination Finger Nose Finger Test: WFL on RUE Heel Shin Test: decreased on LLE due to knee pain Motor  Motor Motor: Abnormal postural alignment and control;Abnormal tone Motor - Skilled Clinical Observations: general weakness  Mobility Bed Mobility Bed Mobility: Rolling Right;Rolling Left;Supine to Sit;Sit to Supine Rolling Right: Moderate Assistance - Patient 50-74% Rolling Left: Moderate Assistance - Patient 50-74% Supine to Sit: Moderate Assistance - Patient 50-74% Sit to Supine: Moderate Assistance - Patient 50-74% Transfers Transfers: Sit to Stand;Stand Pivot Transfers Sit to Stand: Moderate Assistance - Patient 50-74% Stand to Sit: Moderate Assistance - Patient 50-74% Stand Pivot Transfers: Moderate  Assistance - Patient 50 - 74% Stand Pivot Transfer Details: Verbal cues for sequencing;Verbal cues for technique;Verbal cues for precautions/safety Stand Pivot Transfer Details (indicate cue type and reason): Verbal cues for hand placement, foot placement Transfer (Assistive device): None Locomotion  Gait Ambulation: Yes Gait Assistance: Moderate Assistance - Patient 50-74% Gait Distance (Feet): 18 Feet Assistive device: Rolling walker Gait Assistance Details: Verbal cues for gait pattern;Verbal cues for safe use of DME/AE;Verbal cues for technique;Verbal cues for precautions/safety;Verbal cues for sequencing Gait Assistance Details: Poor weight shift to L, decresed foot clearance on R, cues for RW safety. Gait Gait: Yes Gait Pattern: Impaired Gait Pattern: Decreased stance time - left;Decreased stride length;Decreased weight shift to left;Decreased step length - right;Decreased step length - left;Antalgic;Lateral trunk lean to left;Poor foot clearance - right Gait velocity: Decreased Stairs / Additional Locomotion Stairs: Yes Stairs Assistance: Moderate Assistance - Patient 50 - 74% Stair Management Technique: One rail Right Number of Stairs: 1 Height of Stairs: 6  Trunk/Postural Assessment  Cervical Assessment Cervical Assessment: Within Functional Limits Thoracic Assessment Thoracic Assessment: Within Functional Limits Lumbar Assessment Lumbar Assessment: Within Functional Limits Postural Control Postural Control: Deficits on evaluation Postural Limitations: L lateral and occasionally posterior lean  Balance Balance Balance Assessed: Yes Static Sitting Balance Static Sitting - Level of Assistance: 5: Stand by assistance Dynamic Sitting Balance Dynamic Sitting - Level of Assistance: 4: Min assist Static Standing Balance Static Standing - Level of Assistance: 4: Min assist Dynamic Standing Balance Dynamic Standing - Level of Assistance: 3: Mod assist Extremity Assessment   RUE Assessment RUE Assessment: Within Functional Limits LUE Assessment General Strength Comments: Pt has a lifelong congenital condition. AROM sh flexion 40 degrees, hand and wrist in flexed position. RLE Assessment RLE Assessment: Exceptions to [facility] General Strength Comments: grossly generalized 3+/5 LLE Assessment LLE Assessment: Exceptions to [facility] General Strength Comments: grossly generalized 3+/5, L quad 3/5    Refer to Care Plan for Long Term Goals  Recommendations for other services: None   Discharge Criteria: Patient will be discharged from PT if patient refuses treatment 3 consecutive times without medical reason, if treatment goals not met, if there is a change in medical status, if patient  makes no progress towards goals or if patient is discharged from hospital.  The above assessment, treatment plan, treatment alternatives and goals were discussed and mutually agreed upon: by patient  Alfonse Alpers PT, DPT  Drema Dallas Schagen PT, DPT, CSRS 06/18/2019, 1:43 PM

## 2019-06-18 NOTE — Plan of Care (Signed)
  Problem: RH BLADDER ELIMINATION Goal: RH STG MANAGE BLADDER WITH ASSISTANCE Description: STG Manage Bladder With Assistance 06/18/2019 0025 by Sallye Lat, RN Outcome: Progressing 06/18/2019 0022 by Sallye Lat, RN Flowsheets (Taken 06/18/2019 0022) STG: Pt will manage bladder with assistance: 4-Minimal assistance   Problem: RH SAFETY Goal: RH STG ADHERE TO SAFETY PRECAUTIONS W/ASSISTANCE/DEVICE Description: STG Adhere to Safety Precautions With Assistance/Device. 06/18/2019 0025 by Sallye Lat, RN Outcome: Progressing 06/18/2019 0022 by Sallye Lat, RN Flowsheets (Taken 06/18/2019 0022) STG:Pt will adhere to safety precautions with assistance/device: 6-Modified independent Goal: RH STG DECREASED RISK OF FALL WITH ASSISTANCE Description: STG Decreased Risk of Fall With Assistance. 06/18/2019 0025 by Sallye Lat, RN Outcome: Progressing 06/18/2019 0022 by Sallye Lat, RN Flowsheets (Taken 06/18/2019 0022) [id number] risk of fall  with assistance/device: 6-Modified independent   Problem: RH COGNITION-NURSING Goal: RH STG ANTICIPATES NEEDS/CALLS FOR ASSIST W/ASSIST/CUES Description: STG Anticipates Needs/Calls for Assist With Assistance/Cues. 06/18/2019 0025 by Sallye Lat, RN Outcome: Progressing 06/18/2019 0022 by Sallye Lat, RN Flowsheets (Taken 06/18/2019 0022) STG: Anticipates needs/calls for assistance with assistance/cues: 6-Modified independent

## 2019-06-18 NOTE — Progress Notes (Signed)
Nutrition Brief Note  Patient identified on the Malnutrition Screening Tool (MST) Report with a score of 2.0.  Spoke with pt at bedside. Pt reports that he has a good appetite. Pt currently eating lunch at time of RD visit and had completed ~50% of meal by end of RD visit and was continuing to eat. Pt denies any problems or changes with appetite and denies any other nutrition-related issues.  Pt denies any recent weight loss. Pt reports that his weight fluctuates "up and down" but that it is stable overall.  Wt Readings from Last 15 Encounters:  06/17/19 80.9 kg  06/10/19 84.6 kg  06/04/19 80 kg  11/22/11 83.9 kg    Body mass index is 25.59 kg/m. Patient meets criteria for overweight based on current BMI.   Current diet order is Carb Modified, patient is consuming approximately 90% of meals at this time. Labs and medications reviewed.   No nutrition interventions warranted at this time. If nutrition issues arise, please consult RD.    Gaynell Face, MS, RD, LDN Inpatient Clinical Dietitian Pager: [phone] Weekend/After Hours: [phone]

## 2019-06-18 NOTE — Evaluation (Signed)
Occupational Therapy Assessment and Plan  Patient Details  Name: [name] MRN: [id number] Date of Birth: [birth year]  OT Diagnosis: abnormal posture, apraxia, cognitive deficits and muscle weakness (generalized) Rehab Potential: Rehab Potential (ACUTE ONLY): Good ELOS: 14 -16 days   Today's Date: 06/18/2019 OT Individual Time: 6295-2841 OT Individual Time Calculation (min): 60 min     Problem List:  Patient Active Problem List   Diagnosis Date Noted  . Acute blood loss anemia   . Hypoalbuminemia due to protein-calorie malnutrition ([place])   . Controlled type 2 diabetes mellitus with hyperglycemia, without long-term current use of insulin ([place])   . Right thalamic infarction ([place]) 06/17/2019  . Bilateral hearing loss   . Coronary artery disease involving coronary bypass graft of native heart without angina pectoris   . Diabetes mellitus type 2 in nonobese (HCC)   . Bilateral carotid artery stenosis   . History of CVA with residual deficit   . Essential hypertension 06/12/2019  . CKD (chronic kidney disease), stage III 06/12/2019  . CVA (cerebral vascular accident) ([place]) 06/11/2019  . Speech abnormality 06/10/2019    Past Medical History:  Past Medical History:  Diagnosis Date  . Anxiety   . Arthritis   . Asthma   . Coronary artery disease   . Diabetes mellitus without complication ([place])   . Gout   . HOH (hard of hearing) bilateral  . Hypertension   . PONV (postoperative nausea and vomiting)   . Right thalamic infarction [facility]) 06/17/2019   Past Surgical History:  Past Surgical History:  Procedure Laterality Date  . CATARACT EXTRACTION W/PHACO  11/28/2011   Procedure: CATARACT EXTRACTION PHACO AND INTRAOCULAR LENS PLACEMENT (IOC);  Surgeon: Williams Che, MD;  Location: [facility];  Service: Ophthalmology;  Laterality: Right;  CDE 3.84  . CORONARY ARTERY BYPASS GRAFT  1997   6 vessels  . EYE SURGERY  1995   left eye cataract removal   . TOTAL KNEE  PROSTHESIS  REMOVAL W/ SPACER INSERTION  8 yrs ago   right    Assessment & Plan Clinical Impression:[name] is an 83 year old right-handed male with history of CAD with CABG 1997 maintained on aspirin, hard of hearing, CKD stage III, diabetes mellitus, hypertension, asthma.  History taken from chart review and wife due to cognition. Presented 06/10/2019 with recent fall 10 days ago without loss of consciousness.  Family notes decrease in balance as well as bouts of nausea.  Urine drug screen positive benzos, sodium 132, creatinine 1.98, lactic acid within normal limits alcohol level negative, COVID negative.  Cranial CT scan negative for any acute intracranial abnormalities.  Chronic small vessel ischemic with small chronic infarcts.  CT angiogram head and neck occluded right vertebral artery.  80% proximal left ICA stenosis.  MRI showing small medial right thalamic and internal capsule infarcts. Carotid Dopplers did show 67.9% right ICA and 67.9% left ICA stenosis.  Echocardiogram with EF of 65%. Hospital course further complicated by AMS. Neurology consulted placed on aspirin 81 mg daily plus Plavix 75 mg daily x3 months then Plavix alone.  Subcutaneous Lovenox for DVT prophylaxis.  Plan is to follow up as outpatient with vascular surgery for carotid stenosis.  Tolerating a regular consistency diet.  Therapy evaluation completed and patient was admitted for a comprehensive rehab program. Case discussed with authorizing MD. Please see preadmission assessment from today as well.      Patient transferred to CIR on 06/17/2019 .    Patient currently requires mod  with basic self-care skills secondary to muscle weakness, decreased cardiorespiratoy endurance, motor apraxia, decreased midline orientation, decreased awareness, decreased problem solving, decreased safety awareness, decreased memory and delayed processing and decreased sitting balance, decreased standing balance, decreased postural control and decreased  balance strategies.  Prior to hospitalization, patient was fully independent, driving, mowing lawns.   Patient will benefit from skilled intervention to increase independence with basic self-care skills prior to discharge home with care partner.  Anticipate patient will require 24 hour supervision and follow up home health.  OT - End of Session Activity Tolerance: Tolerates 10 - 20 min activity with multiple rests Endurance Deficit: Yes OT Assessment Rehab Potential (ACUTE ONLY): Good OT Patient demonstrates impairments in the following area(s): Balance;Cognition;Endurance;Motor;Perception OT Basic ADL's Functional Problem(s): Eating;Grooming;Bathing;Dressing;Toileting OT Transfers Functional Problem(s): Toilet;Tub/Shower OT Additional Impairment(s): None OT Plan OT Intensity: Minimum of 1-2 x/day, 45 to 90 minutes OT Frequency: 5 out of 7 days OT Duration/Estimated Length of Stay: 14 -16 days OT Treatment/Interventions: Balance/vestibular training;Neuromuscular re-education;Self Care/advanced ADL retraining;Therapeutic Exercise;UE/LE Strength taining/ROM;DME/adaptive equipment instruction;Cognitive remediation/compensation;Patient/family education;UE/LE Coordination activities;Visual/perceptual remediation/compensation;Therapeutic Activities;Psychosocial support;Functional mobility training;Discharge planning OT Self Feeding Anticipated Outcome(s): Independent OT Basic Self-Care Anticipated Outcome(s): Supervision OT Toileting Anticipated Outcome(s): Supervision OT Bathroom Transfers Anticipated Outcome(s): Supervision OT Recommendation Patient destination: Home Follow Up Recommendations: Home health OT Equipment Recommended: Tub/shower seat   Skilled Therapeutic Intervention Pt seen for initial evaluation and ADL training.  At the start of the session, pt was quite confused about where he was. He was convinced he was at home. As the session progressed he became more lucid.  His balance  also fluctuated as at times he demonstrated a Left lean and then other times he only needed min A to stand.   With familiar tasks of grooming, bathing and dressing, he did very well only needing a small amount of A.   Explained role of OT and discussed goals, but unsure how much pt will be able to recall. Pt with nurse tech at end of session.  OT Evaluation Precautions/Restrictions  Precautions Precautions: Fall  Pain Pain Assessment Pain Score: 0-No pain Home Living/Prior Functioning Home Living Family/patient expects to be discharged to:: Unsure Living Arrangements: Spouse/significant other Available Help at Discharge: Family Type of Home: House Home Access: Stairs to enter Home Layout: One level Bathroom Shower/Tub: Gaffer  Lives With: Spouse(Linda) Prior Function Level of Independence: Independent with basic ADLs, Independent with gait Driving: Yes Vocation: Retired ADL ADL Eating: Set up Grooming: Setup Where Assessed-Grooming: Sitting at sink Upper Body Bathing: Setup Where Assessed-Upper Body Bathing: Sitting at sink Lower Body Bathing: Minimal assistance(min A for balance) Where Assessed-Lower Body Bathing: Standing at sink Upper Body Dressing: Setup Where Assessed-Upper Body Dressing: Sitting at sink Lower Body Dressing: Maximal assistance Where Assessed-Lower Body Dressing: Standing at sink, Sitting at sink Vision Baseline Vision/History: Wears glasses Wears Glasses: At all times Patient Visual Report: No change from baseline Vision Assessment?: No apparent visual deficits Perception  Perception: Impaired(to be assessed further. On both his watch and wall clock he reversed the hour/ minute hand to state the incorrect time.) Praxis Praxis: Impaired Praxis Impairment Details: Motor planning Praxis-Other Comments: min impaired with mobility Cognition Overall Cognitive Status: Impaired/Different from baseline Arousal/Alertness:  Awake/alert Orientation Level: Person Year: 2020 Month: October Day of Week: Incorrect(Monday) Memory: Impaired Memory Impairment: Retrieval deficit;Storage deficit;Decreased recall of new information Immediate Memory Recall: Sock;Blue;Bed Memory Recall Sock: With Cue Memory Recall Blue: Without Cue Memory Recall Bed: With Cue Attention: Focused;Sustained Focused Attention:  Impaired Awareness: Impaired Awareness Impairment: Intellectual impairment;Emergent impairment(unable to state why he was in the hospital) Problem Solving: Impaired Problem Solving Impairment: Verbal basic;Functional basic Sensation Sensation Light Touch: Appears Intact Hot/Cold: Appears Intact Proprioception: Appears Intact Stereognosis: Appears Intact(in R hand, L hand not fully functional) Coordination Gross Motor Movements are Fluid and Coordinated: No Fine Motor Movements are Fluid and Coordinated: No(LUE affected by congenital condition) Coordination and Movement Description: decreased coordination of limbs with trunk with transitional movements and needs guiding A Motor  Motor Motor - Skilled Clinical Observations: generalized motor weakness Mobility    min -mod A stand pivot transfers Trunk/Postural Assessment  Cervical Assessment Cervical Assessment: Within Functional Limits Thoracic Assessment Thoracic Assessment: Within Functional Limits Lumbar Assessment Lumbar Assessment: Within Functional Limits Postural Control Postural Control: Deficits on evaluation Postural Limitations: pt tends to lean to his L, fluctuates  Balance Static Sitting Balance Static Sitting - Level of Assistance: 5: Stand by assistance Dynamic Sitting Balance Dynamic Sitting - Level of Assistance: 4: Min assist Static Standing Balance Static Standing - Level of Assistance: 4: Min assist Dynamic Standing Balance Dynamic Standing - Level of Assistance: 3: Mod assist Extremity/Trunk Assessment RUE Assessment RUE  Assessment: Within Functional Limits LUE Assessment General Strength Comments: Pt has a lifelong congenital condition. AROM sh flexion 40 degrees, hand and wrist in flexed position.     Refer to Care Plan for Long Term Goals  Recommendations for other services: None    Discharge Criteria: Patient will be discharged from OT if patient refuses treatment 3 consecutive times without medical reason, if treatment goals not met, if there is a change in medical status, if patient makes no progress towards goals or if patient is discharged from hospital.  The above assessment, treatment plan, treatment alternatives and goals were discussed and mutually agreed upon: by patient and No family available/patient unable to fully understand  [place] 06/18/2019, 1:01 PM

## 2019-06-18 NOTE — Evaluation (Addendum)
Speech Language Pathology Assessment and Plan  Patient Details  Name: [name] MRN: [id number] Date of Birth: Dec 02, 1935  SLP Diagnosis: Speech and Language deficits  Rehab Potential: Good ELOS: 14-16 days    Today's Date: 06/18/2019 SLP Individual Time: 1400-1510 SLP Individual Time Calculation (min): 70 min   Problem List:  Patient Active Problem List   Diagnosis Date Noted  . Acute blood loss anemia   . Hypoalbuminemia due to protein-calorie malnutrition ([place])   . Controlled type 2 diabetes mellitus with hyperglycemia, without long-term current use of insulin ([place])   . Right thalamic infarction ([place]) 06/17/2019  . Bilateral hearing loss   . Coronary artery disease involving coronary bypass graft of native heart without angina pectoris   . Diabetes mellitus type 2 in nonobese (HCC)   . Bilateral carotid artery stenosis   . History of CVA with residual deficit   . Essential hypertension 06/12/2019  . CKD (chronic kidney disease), stage III 06/12/2019  . CVA (cerebral vascular accident) ([place]) 06/11/2019  . Speech abnormality 06/10/2019   Past Medical History:  Past Medical History:  Diagnosis Date  . Anxiety   . Arthritis   . Asthma   . Coronary artery disease   . Diabetes mellitus without complication ([place])   . Gout   . HOH (hard of hearing) bilateral  . Hypertension   . PONV (postoperative nausea and vomiting)   . Right thalamic infarction [facility]) 06/17/2019   Past Surgical History:  Past Surgical History:  Procedure Laterality Date  . CATARACT EXTRACTION W/PHACO  11/28/2011   Procedure: CATARACT EXTRACTION PHACO AND INTRAOCULAR LENS PLACEMENT (IOC);  Surgeon: Williams Che, MD;  Location: [facility];  Service: Ophthalmology;  Laterality: Right;  CDE 3.84  . CORONARY ARTERY BYPASS GRAFT  1997   6 vessels  . EYE SURGERY  1995   left eye cataract removal   . TOTAL KNEE  PROSTHESIS REMOVAL W/ SPACER INSERTION  8 yrs ago   right    Assessment / Plan /  Recommendation Clinical Impression  [name] is an 83 year old right-handed male with history of CAD with CABG 1997 maintained on aspirin, hard of hearing, CKD stage III, diabetes mellitus, hypertension, asthma. History taken from chart review and wife due to cognition. Presented 06/10/2019 with recent fall 10 days ago without loss of consciousness. Family notes decrease in balance as well as bouts of nausea. Urine drug screen positive benzos, sodium 132, creatinine 1.98, lactic acid within normal limits alcohol level negative, COVID negative. Cranial CT scan negative for any acute intracranial abnormalities. Chronic small vessel ischemic with small chronic infarcts. CT angiogram head and neck occluded right vertebral artery. 80% proximal left ICA stenosis. MRI showing small medial right thalamic and internal capsule infarcts. Carotid Dopplers did show 67.9% right ICA and 67.9% left ICA stenosis. Echocardiogram with EF of 65%. Hospital course further complicated by AMS.Neurology consulted placed on aspirin 81 mg daily plus Plavix 75 mg daily x3 months then Plavix alone. Subcutaneous Lovenox for DVT prophylaxis. Plan is to follow up as outpatient with vascular surgery for carotid stenosis. Tolerating a regular consistency diet. Therapy evaluation completed and patient was admitted for a comprehensive rehab program. Case discussed with authorizing MD. Please see preadmission assessment from today as well.Patient transferred to CIR on 06/17/2019 .    Pt presents with severe-moderate cognitive linguistic impairments, deficits include basic auditory comprehension, basic problem solving, intellectual awareness, immediate recall, verbal/functional initiation, safety awareness, secondary to reduced focused/sustained attention and delayed  processing. Pt demonstrates ability to follow 1 step/basic commands inconsistently. Pt was orientated to situation, place and time, except day/date with no  intellectual awareness of deficits. Pt demonstrates ability to express thoughts at phrase level, however demonstrates difficulty initiating verbal response at times questioning impact from motor planning deficits verse cognitive-linguistic impairments, further assessment is needed. Pt demonstrated ability to answer simple yes/no questions, complex yes/no question in 3/5 opportunities, repeat at word/phrase level and name common objects in room with cues for initiation and rephrasing. Pt would benefit from ongoing cognitive-linguistic assessment, ST services to maximize functional independent and reduce burden of care requiring 24 hour supervision and  continued ST services.    Skilled Therapeutic Interventions          Skilled ST services focused on cognitive skills. SLP facilitated cognitive linguistic assessment, providing education of results and goals for treatment to pt and pt's wife, [name]. All questions were answered to satisfaction. Pt was left with wife, call bell within reach and chair alarm set. ST recommends to continue ST services.   SLP Assessment  Patient will need skilled Speech Lanaguage Pathology Services during CIR admission    Recommendations  SLP Diet Recommendations: Thin Medication Administration: Whole meds with liquid Supervision: Patient able to self feed;Full supervision/cueing for compensatory strategies(due to cognition) Compensations: Minimize environmental distractions;Small sips/bites Postural Changes and/or Swallow Maneuvers: Seated upright 90 degrees Oral Care Recommendations: Oral care BID Patient destination: Home Follow up Recommendations: 24 hour supervision/assistance;Home Health SLP Equipment Recommended: None recommended by SLP    SLP Frequency 3 to 5 out of 7 days   SLP Duration  SLP Intensity  SLP Treatment/Interventions 14-16 days  Minumum of 1-2 x/day, 30 to 90 minutes  Cognitive remediation/compensation;Cueing hierarchy;Functional  tasks;Environmental controls;Internal/external aids;Speech/Language facilitation;Patient/family education    Pain Pain Assessment Pain Score: 0-No pain  Prior Functioning Cognitive/Linguistic Baseline: Within functional limits Type of Home: House  Lives With: Spouse Available Help at Discharge: Family Vocation: Retired  Industrial/product designer Term Goals: Week 1: SLP Short Term Goal 1 (Week 1): Pt will demonstrate sustained attention to task in 1 minute intervals with mod A verbal cues for redirection. SLP Short Term Goal 2 (Week 1): Pt will demonstrate verbal and functional initation of requested task in 7/10 opportunties with max A verbal cues. SLP Short Term Goal 3 (Week 1): Pt will follow basic commands during functional tasks with min A verbal cues. SLP Short Term Goal 4 (Week 1): Pt will identify 1 cognitive and 1 physical deficit with max A verbal cues.  Refer to Care Plan for Long Term Goals  Recommendations for other services: None   Discharge Criteria: Patient will be discharged from SLP if patient refuses treatment 3 consecutive times without medical reason, if treatment goals not met, if there is a change in medical status, if patient makes no progress towards goals or if patient is discharged from hospital.  The above assessment, treatment plan, treatment alternatives and goals were discussed and mutually agreed upon: by patient and by family  Renold Kozar  [facility] 06/18/2019, 5:24 PM

## 2019-06-18 NOTE — Progress Notes (Signed)
BP-184/69 this am, pt asymptomatic. Dan Angiulli-PA notified and will continue to monitor, BP will be "permissible" at present time.

## 2019-06-18 NOTE — Progress Notes (Addendum)
[place] PHYSICAL MEDICINE & REHABILITATION PROGRESS NOTE  Subjective/Complaints: Patient seen sitting up in his bed working with therapy this morning.  No reported issues overnight.  Discussed cognition and function with therapies.  ROS: Limited due to cognition.  Objective: Vital Signs: Blood pressure (!) 180/72, pulse 83, temperature 99.3 F (37.4 C), temperature source Oral, resp. rate 18, height 5\' 10"  (1.778 m), weight 80.9 kg, SpO2 97 %. No results found. Recent Labs    06/17/19 1959 06/18/19 0451  WBC 8.2 5.3  HGB 10.7* 9.8*  HCT 32.2* 30.3*  PLT 399 393   Recent Labs    06/17/19 1959 06/18/19 0451  NA  --  137  K  --  4.3  CL  --  105  CO2  --  21*  GLUCOSE  --  116*  BUN  --  37*  CREATININE 2.07* 1.81*  CALCIUM  --  9.1    Physical Exam: BP (!) 180/72   Pulse 83   Temp 99.3 F (37.4 C) (Oral)   Resp 18   Ht 5\' 10"  (1.778 m)   Wt 80.9 kg   SpO2 97%   BMI 25.59 kg/m  Constitutional: No distress . Vital signs reviewed. HENT: Normocephalic.  Atraumatic. Eyes: EOMI. No discharge. Cardiovascular: No JVD. Respiratory: Normal effort.  No stridor. GI: Non-distended. Skin: Warm and dry.  Intact. Psych: Unable to assess due to cognition Musc: Left upper extremity congenital deformity Neurological: Alert  HOH Motor: Right upper extremity: 5/5 proximal distal Left upper extremity: Limited due to deformity Bilateral lower extremities: Hip flexion, knee extension 2/5, ankle dorsiflexion 5/5   Assessment/Plan: 1. Functional deficits secondary to left thalamic/internal capsule infarcts which require 3+ hours per day of interdisciplinary therapy in a comprehensive inpatient rehab setting.  Physiatrist is providing close team supervision and 24 hour management of active medical problems listed below.  Physiatrist and rehab team continue to assess barriers to discharge/monitor patient progress toward functional and medical goals  Care Tool:  Bathing     Body parts bathed by patient: Right arm, Left arm, Chest, Abdomen, Front perineal area, Buttocks, Right upper leg, Left upper leg, Right lower leg, Left lower leg, Face         Bathing assist Assist Level: Minimal Assistance - Patient > 75%(min A to balance in standing)     Upper Body Dressing/Undressing Upper body dressing   What is the patient wearing?: Pull over shirt    Upper body assist Assist Level: Minimal Assistance - Patient > 75%    Lower Body Dressing/Undressing Lower body dressing      What is the patient wearing?: Incontinence brief, Pants     Lower body assist Assist for lower body dressing: Maximal Assistance - Patient 25 - 49%     Toileting Toileting    Toileting assist Assist for toileting: Moderate Assistance - Patient 50 - 74%     Transfers Chair/bed transfer  Transfers assist     Chair/bed transfer assist level: Moderate Assistance - Patient 50 - 74%     Locomotion Ambulation   Ambulation assist              Walk 10 feet activity   Assist           Walk 50 feet activity   Assist           Walk 150 feet activity   Assist           Walk 10 feet on uneven surface  activity   Assist           Wheelchair     Assist               Wheelchair 50 feet with 2 turns activity    Assist            Wheelchair 150 feet activity     Assist            Medical Problem List and Plan: 1.  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   Begin CIR evaluations.  Team conference today to discuss current and goals and coordination of care, home and environmental barriers, and discharge planning with nursing, case manager, and therapies.  2.  Antithrombotics: -DVT/anticoagulation: Lovenox             -antiplatelet therapy: Aspirin 81 mg daily,  Plavix 75 mg daily x3 months then Plavix alone 3. Pain Management: Tylenol as needed 4. Mood: Provide emotional support             -antipsychotic agents: N/A 5. Neuropsych: This patient is not capable of making decisions on his own behalf. 6. Skin/Wound Care: Routine skin checks 7. Fluids/Electrolytes/Nutrition: Routine in and outs 8.  Bilateral carotid stenosis.  Follow-up vascular surgery as outpatient. 9.  Diabetes mellitus with hyperglycemia.  Hemoglobin A1c 5.8.  Glucotrol 5 mg daily.            Monitor with increased mobility 10. Essential hypertension.  Presently on Norvasc 5mg  daily.  Patient on Tenormin 100 mg daily and lisinopril 5 mg daily prior to admission.  Resume as needed             Monitor with increased mobility 11.  CKD stage III.    Creatinine 1.81 on 10/6  Continue to monitor 12.  Hyperlipidemia.  Lipitor 13.  CAD with CABG 1997.  No chest pain or shortness of breath.  Continue aspirin and Plavix 14.  History of gout.  Zyloprim 300 mg daily.  Monitor for gout flareups 15.  Hard of hearing: Ensure patient wearing hearing aids 16.  History of asthma.  Monitor for increased shortness of breath.  Patient on albuterol inhaler as needed as well as theophylline 300 mg twice daily prior to admission. 17.  Hypoalbuminemia  Supplement initiated on 10/6 18.  Acute blood loss anemia  Hemoglobin 9.8 on 10/6  Continue to monitor  LOS: 1 days A FACE TO FACE EVALUATION WAS PERFORMED  Dannell Raczkowski Lorie Phenix 06/18/2019, 12:07 PM

## 2019-06-19 ENCOUNTER — Inpatient Hospital Stay (HOSPITAL_COMMUNITY): Payer: Medicare Other | Admitting: Physical Therapy

## 2019-06-19 ENCOUNTER — Inpatient Hospital Stay (HOSPITAL_COMMUNITY): Payer: Medicare Other

## 2019-06-19 DIAGNOSIS — R7309 Other abnormal glucose: Secondary | ICD-10-CM

## 2019-06-19 LAB — GLUCOSE, CAPILLARY
Glucose-Capillary: 110 mg/dL — ABNORMAL HIGH (ref 70–99)
Glucose-Capillary: 162 mg/dL — ABNORMAL HIGH (ref 70–99)
Glucose-Capillary: 181 mg/dL — ABNORMAL HIGH (ref 70–99)
Glucose-Capillary: 185 mg/dL — ABNORMAL HIGH (ref 70–99)

## 2019-06-19 NOTE — Patient Care Conference (Signed)
Inpatient RehabilitationTeam Conference and Plan of Care Update Date: 06/18/2019   Time: 10:20 AM    Patient Name: [name]      Medical Record Number: [id number]  Date of Birth: 03-05-1936 Sex: Male         Room/Bed: 4M02C/4M02C-01 Payor Info: Payor: Marine scientist / Plan: [facility] MEDICARE / Product Type: *No Product type* /    Admit Date/Time:  06/17/2019  6:57 PM  Primary Diagnosis:  Right thalamic infarction [facility])  Patient Active Problem List   Diagnosis Date Noted  . Labile blood glucose   . Acute blood loss anemia   . Hypoalbuminemia due to protein-calorie malnutrition ([place])   . Controlled type 2 diabetes mellitus with hyperglycemia, without long-term current use of insulin ([place])   . Right thalamic infarction ([place]) 06/17/2019  . Bilateral hearing loss   . Coronary artery disease involving coronary bypass graft of native heart without angina pectoris   . Diabetes mellitus type 2 in nonobese (HCC)   . Bilateral carotid artery stenosis   . History of CVA with residual deficit   . Essential hypertension 06/12/2019  . CKD (chronic kidney disease), stage III 06/12/2019  . CVA (cerebral vascular accident) ([place]) 06/11/2019  . Speech abnormality 06/10/2019    Expected Discharge Date: Expected Discharge Date: (TBD)  Team Members Present: Physician leading conference: Dr. Courtney Heys Social Worker Present: Lennart Pall, LCSW Nurse Present: Dwaine Gale, RN PT Present: Lavone Nian, PT OT Present: Laverle Hobby, OT SLP Present: Weston Anna, SLP PPS Coordinator present : Gunnar Fusi, SLP     Current Status/Progress Goal Weekly Team Focus  Bowel/Bladder   Continent of bowel, incontinent of urine at times LBM-10/5  Regain normal urinary and bowel pattern, continence.  Assist with toileting needs as needed. Timed toileting.   Swallow/Nutrition/ Hydration             ADL's   min - mod A overall  S overall  ADL training, functional mobilty, balance,  cognition, perception   Mobility   eval pending  TBD eval pending  TBD eval pending   Communication   Eval Pending         Safety/Cognition/ Behavioral Observations  Eval Pending         Pain   Denies pain.  Remain pain free.  Assess and address pain every shift and prn.   Skin   Ecchymosis noted to right abdomen and bilateral arms. Scabbed over abrasion noted to right forearm. No open areas noted.  No new areas of skin impairment.  Assess and address skin issues every shift and prn.    Rehab Goals Patient on target to meet rehab goals: Yes *See Care Plan and progress notes for long and short-term goals.     Barriers to Discharge  Current Status/Progress Possible Resolutions Date Resolved   Nursing  Behavior  Patient presents with coginitive impairment and poor safety behaviors.            PT                    OT                  SLP                SW                Discharge Planning/Teaching Needs:  Plan for home with wife and family to provide 24/7 assistance  Teaching needs TBD  Team Discussion:  New eval;  Sundowning but more alert days.  All evals pending.  Revisions to Treatment Plan:  NA    Medical Summary Current Status: Pt is participating in therapies- just got started for new CVA Weekly Focus/Goal: therapies and elevated BP  Barriers to Discharge: Decreased family/caregiver support;Home enviroment access/layout;Medical stability;Medication compliance;Weight  Barriers to Discharge Comments: n/a Possible Resolutions to Barriers: elevated BP- working on it   Continued Need for Acute Rehabilitation Level of Care: The patient requires daily medical management by a physician with specialized training in physical medicine and rehabilitation for the following reasons: Direction of a multidisciplinary physical rehabilitation program to maximize functional independence : Yes Medical management of patient stability for increased activity during participation in  an intensive rehabilitation regime.: Yes Analysis of laboratory values and/or radiology reports with any subsequent need for medication adjustment and/or medical intervention. : Yes   I attest that I was present, lead the team conference, and concur with the assessment and plan of the team.   Allayna Erlich 06/19/2019, 3:47 PM

## 2019-06-19 NOTE — Progress Notes (Signed)
Occupational Therapy Session Note  Patient Details  Name: [name] MRN: [id number] Date of Birth: [birth year]  Today's Date: 06/19/2019 OT Individual Time: 0350-0938 OT Individual Time Calculation (min): 45 min    Short Term Goals: Week 1:  OT Short Term Goal 1 (Week 1): Pt will demonstrate improved balance to stand at sink to complete grooming with CGA. OT Short Term Goal 2 (Week 1): Pt will be able to don pants with min A. OT Short Term Goal 3 (Week 1): Pt will be able to transfer to toilet with CGA. OT Short Term Goal 4 (Week 1): Pt will demonstrate improved awareness by stating where he is and what his medical condition is.  Skilled Therapeutic Interventions/Progress Updates:    1:1. Pt received in bed asleep but easily aroused. Pt received supine.sitting with MOD A and transfers EOB<>w/c<>TTB with MIN A and VC for hand placement and increased time weight shifting L to step/pivot RLE. Pt educated on TTB option if stepping over shower ledge is not safe. Pt completes 5 sit to stand at high low table with MIN A fading to S with no UE power up. Pt has small BM on toilet with A for CM after toileting d/t time constraints. Exited session with pt seated in bed,e xit alram on and call light in reach  Therapy Documentation Precautions:  Precautions Precautions: Fall Precaution Comments: HOH, delayed initiation for tasks Restrictions Weight Bearing Restrictions: No General:   Vital Signs: Therapy Vitals Temp: 97.7 F (36.5 C) Temp Source: Oral Pulse Rate: (!) 107 BP: (!) 164/84 Patient Position (if appropriate): Sitting Oxygen Therapy SpO2: 98 % O2 Device: Room Air Pain:   ADL: ADL Eating: Set up Grooming: Setup Where Assessed-Grooming: Sitting at sink Upper Body Bathing: Setup Where Assessed-Upper Body Bathing: Sitting at sink Lower Body Bathing: Minimal assistance(min A for balance) Where Assessed-Lower Body Bathing: Standing at sink Upper Body Dressing:  Setup Where Assessed-Upper Body Dressing: Sitting at sink Lower Body Dressing: Maximal assistance Where Assessed-Lower Body Dressing: Standing at sink, Sitting at sink Vision   Perception    Praxis   Exercises:   Other Treatments:     Therapy/Group: Individual Therapy  Tonny Branch 06/19/2019, 4:18 PM

## 2019-06-19 NOTE — Progress Notes (Signed)
[place] PHYSICAL MEDICINE & REHABILITATION PROGRESS NOTE  Subjective/Complaints: Patient seen sitting up in his chair this morning.  No reported issues overnight.  He states he "thinks therapies went okay".  ROS: Limited due to cognition.  Objective: Vital Signs: Blood pressure (!) 168/68, pulse 86, temperature 99.1 F (37.3 C), temperature source Oral, resp. rate 16, height 5\' 10"  (1.778 m), weight 80.9 kg, SpO2 97 %. No results found. Recent Labs    06/17/19 1959 06/18/19 0451  WBC 8.2 5.3  HGB 10.7* 9.8*  HCT 32.2* 30.3*  PLT 399 393   Recent Labs    06/17/19 1959 06/18/19 0451  NA  --  137  K  --  4.3  CL  --  105  CO2  --  21*  GLUCOSE  --  116*  BUN  --  37*  CREATININE 2.07* 1.81*  CALCIUM  --  9.1    Physical Exam: BP (!) 168/68 (BP Location: Right Arm)   Pulse 86   Temp 99.1 F (37.3 C) (Oral)   Resp 16   Ht 5\' 10"  (1.778 m)   Wt 80.9 kg   SpO2 97%   BMI 25.59 kg/m  Constitutional: No distress . Vital signs reviewed. HENT: Normocephalic.  Atraumatic. Eyes: EOMI. No discharge. Cardiovascular: No JVD. Respiratory: Normal effort.  No stridor. GI: Non-distended. Skin: Warm and dry.  Intact. Psych: Unable to assess due to cognition  Musc: Left upper extremity congenital deformity  Neurological: Alert HOH  Motor: Right upper extremity: 5/5 proximal distal Left upper extremity: Limited due to deformity Bilateral lower extremities: Hip flexion, knee extension 3/5, ankle dorsiflexion 5/5, left weaker than right  Assessment/Plan: 1. Functional deficits secondary to right thalamic/internal capsule infarcts which require 3+ hours per day of interdisciplinary therapy in a comprehensive inpatient rehab setting.  Physiatrist is providing close team supervision and 24 hour management of active medical problems listed below.  Physiatrist and rehab team continue to assess barriers to discharge/monitor patient progress toward functional and medical  goals  Care Tool:  Bathing    Body parts bathed by patient: Right arm, Left arm, Chest, Abdomen, Front perineal area, Buttocks, Right upper leg, Left upper leg, Right lower leg, Left lower leg, Face         Bathing assist Assist Level: Minimal Assistance - Patient > 75%(min A to balance in standing)     Upper Body Dressing/Undressing Upper body dressing   What is the patient wearing?: Pull over shirt    Upper body assist Assist Level: Minimal Assistance - Patient > 75%    Lower Body Dressing/Undressing Lower body dressing      What is the patient wearing?: Incontinence brief, Pants     Lower body assist Assist for lower body dressing: Maximal Assistance - Patient 25 - 49%     Toileting Toileting    Toileting assist Assist for toileting: Moderate Assistance - Patient 50 - 74%     Transfers Chair/bed transfer  Transfers assist     Chair/bed transfer assist level: Moderate Assistance - Patient 50 - 74%     Locomotion Ambulation   Ambulation assist      Assist level: Moderate Assistance - Patient 50 - 74% Assistive device: Walker-rolling Max distance: 72ft   Walk 10 feet activity   Assist     Assist level: Moderate Assistance - Patient - 50 - 74% Assistive device: Walker-rolling   Walk 50 feet activity   Assist Walk 50 feet with 2 turns activity did  not occur: Safety/medical concerns(Fatigue and weakness)         Walk 150 feet activity   Assist Walk 150 feet activity did not occur: Safety/medical concerns         Walk 10 feet on uneven surface  activity   Assist Walk 10 feet on uneven surfaces activity did not occur: Safety/medical concerns         Wheelchair     Assist Will patient use wheelchair at discharge?: No             Wheelchair 50 feet with 2 turns activity    Assist            Wheelchair 150 feet activity     Assist            Medical Problem List and Plan: 1.  Left-sided  weakness with dysarthria altered mental status secondary to medial right thalamic and right internal capsule infarction.  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   Continue CIR  2.  Antithrombotics: -DVT/anticoagulation: Lovenox             -antiplatelet therapy: Aspirin 81 mg daily, Plavix 75 mg daily x3 months then Plavix alone 3. Pain Management: Tylenol as needed 4. Mood: Provide emotional support             -antipsychotic agents: N/A 5. Neuropsych: This patient is not capable of making decisions on his own behalf. 6. Skin/Wound Care: Routine skin checks 7. Fluids/Electrolytes/Nutrition: Routine in and outs 8.  Bilateral carotid stenosis.  Follow-up vascular surgery as outpatient. 9.  Diabetes mellitus with hyperglycemia.  Hemoglobin A1c 5.8.  Glucotrol 5 mg daily.            Labile on 10/7, monitor for trend  Monitor with increased mobility 10. Essential hypertension.  Presently on Norvasc 5mg  daily.  Patient on Tenormin 100 mg daily and lisinopril 5 mg daily prior to admission.  Resume as needed  Elevated, but?  Improving on 10/7  Monitor with increased mobility 11.  CKD stage III.    Creatinine 1.81 on 10/6  Continue to monitor 12.  Hyperlipidemia.  Lipitor 13.  CAD with CABG 1997.  No chest pain or shortness of breath.  Continue aspirin and Plavix 14.  History of gout.  Zyloprim 300 mg daily.  Monitor for gout flareups 15.  Hard of hearing: Ensure patient wearing hearing aids 16.  History of asthma.  Monitor for increased shortness of breath.  Patient on albuterol inhaler as needed as well as theophylline 300 mg twice daily prior to admission. 17.  Hypoalbuminemia  Supplement initiated on 10/6 18.  Acute blood loss anemia  Hemoglobin 9.8 on 10/6, labs ordered for tomorrow  Continue to monitor  LOS: 2 days A FACE TO FACE EVALUATION WAS PERFORMED   [name] 06/19/2019, 9:01 AM

## 2019-06-19 NOTE — Progress Notes (Signed)
Occupational Therapy Session Note  Patient Details  Name: [name] MRN: [id number] Date of Birth: [birth year]  Today's Date: 06/19/2019 OT Individual Time: 0730-0830 OT Individual Time Calculation (min): 60 min    Short Term Goals: Week 1:  OT Short Term Goal 1 (Week 1): Pt will demonstrate improved balance to stand at sink to complete grooming with CGA. OT Short Term Goal 2 (Week 1): Pt will be able to don pants with min A. OT Short Term Goal 3 (Week 1): Pt will be able to transfer to toilet with CGA. OT Short Term Goal 4 (Week 1): Pt will demonstrate improved awareness by stating where he is and what his medical condition is.    Skilled Therapeutic Interventions/Progress Updates:  Pt received sitting up in bed eating breakfast c/o pain in his R thigh. Did not rate pain and no c/o pain during tx. Pt comes supine <> EOB with min A for moving legs and completes stand pivot transfer with mod A and min VC for stepping cues. Pt completes shower transfer w/c <> BSC with mod A. 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 Pt is (S) for LB bathing with min VC for lateral leans to wash buttocks. Following shower, pt reports needing to void urine. Pt completes toileting standing at toilet with min A and use of grab bars; voids urine. Pt is min A for UB dressing to pull down shirt on R side and max A for LB dressing while standing d/t time constraint. Pt left in w/c with breakfast tray in front and safety belt on.      Therapy Documentation Precautions:  Precautions Precautions: Fall Precaution Comments: HOH, delayed initiation for tasks Restrictions Weight Bearing Restrictions: No      Therapy/Group: Individual Therapy  Rianne Degraaf 06/19/2019, 8:34 AM

## 2019-06-19 NOTE — Progress Notes (Signed)
Speech Language Pathology Daily Session Note  Patient Details  Name: FRANCES JOYNT MRN: [id number] Date of Birth: [birth year]  Today's Date: 06/19/2019 SLP Individual Time: 1420-1500 SLP Individual Time Calculation (min): 40 min  Short Term Goals: Week 1: SLP Short Term Goal 1 (Week 1): Pt will demonstrate sustained attention to task in 1 minute intervals with mod A verbal cues for redirection. SLP Short Term Goal 2 (Week 1): Pt will demonstrate verbal and functional initation of requested task in 7/10 opportunties with max A verbal cues. SLP Short Term Goal 3 (Week 1): Pt will follow basic commands during functional tasks with min A verbal cues. SLP Short Term Goal 4 (Week 1): Pt will identify 1 cognitive and 1 physical deficit with max A verbal cues.  Skilled Therapeutic Interventions: Skilled ST services focused on cognitive skills. Pt demonstrated overall increase sustained attention compare to on evaluation. SLP adjusted hearing aid volume during session, pt stated ability to hear SLP, suggesting greater deficits of auditory comprehension verse hearing impairment.  SLP facilitated initiation of verbal expression in multi syllable word repetition task, pt demonstrated ability to repeat 6/6 words with mod A verbal cues to initiate response requiring extra time and verbal prompts. Pt was unable to repeat words when presented auditorally, initial repeating them with semantic and articulation errors, however was able to read and repeat words when written. SLP also facilitated basic problem solving, functional initiation and sustained attention in card sorting tasks by two colors, pt required supervision A verbal cues for problem solving/initating task and sustained attention in 7 minute interval with supervision A verbal cues. Pt required max A verbal cues for problem solving during card task of WAR. Pt requested to get into bed. SLP and NT assisted, pt required max A to initiate movements and  problem solve during transfer. Pt was left in room with call bell within reach and bed alarm set. ST recommends to continue skilled ST services.      Pain Pain Assessment Pain Score: 0-No pain  Therapy/Group: Individual Therapy  Wanya Bangura  [facility] 06/19/2019, 6:03 PM

## 2019-06-19 NOTE — Progress Notes (Signed)
Physical Therapy Session Note  Patient Details  Name: [name] MRN: [id number] Date of Birth: [birth year]  Today's Date: 06/19/2019 PT Individual Time: 0925-1020 PT Individual Time Calculation (min): 55 min   Short Term Goals: Week 1:  PT Short Term Goal 1 (Week 1): Pt will ambulate 92ft with RW min A PT Short Term Goal 2 (Week 1): Pt will navigate 3 steps min A with 1 rail PT Short Term Goal 3 (Week 1): Pt will transfer bed<>WC stand pivot min A  Skilled Therapeutic Interventions/Progress Updates:  Pt received in w/c & agreeable to tx. Pt reports unrated pain in L knee during standing/gait with rest breaks provided PRN for pain relief/management. Pt transfers sit>stand with multiple attempts but ultimately min assist, and stand>sit with instructional cuing for hand placement. Pt ambulates 15 ft + 20 ft with RW & min assist & significantly decreased gait speed with heavy cuing to ambulate within base of RW. Pt utilized dynavision while standing without UE support with min assist for balance for 2 minutes + 2 minutes with seated rest break in between; no LOB noted during task & pt with some improvement in scanning entire board during activity. Pt negotiates 4 steps with R ascending rail & R descending rail with only RUE support, min assist, and verbal/tactile cuing for compensatory pattern. At end of session pt left in w/c with chair alarm donned & call bell in reach.  Pt with slight tremors noted in RUE on this date. 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   Pt requires extra time to complete all tasks 2/2 delayed processing.  Therapy Documentation Precautions:  Precautions Precautions: Fall Precaution Comments: HOH, delayed initiation for tasks Restrictions Weight Bearing Restrictions: No    Therapy/Group: Individual Therapy  Waunita Schooner 06/19/2019, 10:20 AM

## 2019-06-20 ENCOUNTER — Inpatient Hospital Stay (HOSPITAL_COMMUNITY): Payer: Medicare Other | Admitting: Speech Pathology

## 2019-06-20 ENCOUNTER — Inpatient Hospital Stay (HOSPITAL_COMMUNITY): Payer: Medicare Other | Admitting: Physical Therapy

## 2019-06-20 ENCOUNTER — Inpatient Hospital Stay (HOSPITAL_COMMUNITY): Payer: Medicare Other

## 2019-06-20 ENCOUNTER — Inpatient Hospital Stay (HOSPITAL_COMMUNITY): Payer: Medicare Other | Admitting: Occupational Therapy

## 2019-06-20 LAB — CBC WITH DIFFERENTIAL/PLATELET
Abs Immature Granulocytes: 0.03 10*3/uL (ref 0.00–0.07)
Basophils Absolute: 0 10*3/uL (ref 0.0–0.1)
Basophils Relative: 1 %
Eosinophils Absolute: 0.3 10*3/uL (ref 0.0–0.5)
Eosinophils Relative: 4 %
HCT: 32 % — ABNORMAL LOW (ref 39.0–52.0)
Hemoglobin: 10.6 g/dL — ABNORMAL LOW (ref 13.0–17.0)
Immature Granulocytes: 0 %
Lymphocytes Relative: 13 %
Lymphs Abs: 1 10*3/uL (ref 0.7–4.0)
MCH: 29.4 pg (ref 26.0–34.0)
MCHC: 33.1 g/dL (ref 30.0–36.0)
MCV: 88.9 fL (ref 80.0–100.0)
Monocytes Absolute: 0.8 10*3/uL (ref 0.1–1.0)
Monocytes Relative: 11 %
Neutro Abs: 5.7 10*3/uL (ref 1.7–7.7)
Neutrophils Relative %: 71 %
Platelets: 379 10*3/uL (ref 150–400)
RBC: 3.6 MIL/uL — ABNORMAL LOW (ref 4.22–5.81)
RDW: 14.2 % (ref 11.5–15.5)
WBC: 7.9 10*3/uL (ref 4.0–10.5)
nRBC: 0 % (ref 0.0–0.2)

## 2019-06-20 LAB — GLUCOSE, CAPILLARY
Glucose-Capillary: 157 mg/dL — ABNORMAL HIGH (ref 70–99)
Glucose-Capillary: 126 mg/dL — ABNORMAL HIGH (ref 70–99)
Glucose-Capillary: 121 mg/dL — ABNORMAL HIGH (ref 70–99)
Glucose-Capillary: 117 mg/dL — ABNORMAL HIGH (ref 70–99)

## 2019-06-20 MED ORDER — ATENOLOL 50 MG PO TABS
50.0000 mg | ORAL_TABLET | Freq: Every day | ORAL | Status: DC
  Administered 2019-06-20 – 2019-06-29 (×10): 50 mg via ORAL
  Filled 2019-06-20 (×10): qty 1

## 2019-06-20 NOTE — Progress Notes (Signed)
Social Work Assessment and Plan   Patient Details  Name: [name] MRN: [id number] Date of Birth: 28-May-1936  Today's Date: 06/20/2019  Problem List:  Patient Active Problem List   Diagnosis Date Noted  . Labile blood glucose   . Acute blood loss anemia   . Hypoalbuminemia due to protein-calorie malnutrition ([place])   . Controlled type 2 diabetes mellitus with hyperglycemia, without long-term current use of insulin ([place])   . Right thalamic infarction ([place]) 06/17/2019  . Bilateral hearing loss   . Coronary artery disease involving coronary bypass graft of native heart without angina pectoris   . Diabetes mellitus type 2 in nonobese ([facility])   . Bilateral carotid artery stenosis   . History of CVA with residual deficit   . Essential hypertension 06/12/2019  . CKD (chronic kidney disease), stage III 06/12/2019  . CVA (cerebral vascular accident) ([place]) 06/11/2019  . Speech abnormality 06/10/2019   Past Medical History:  Past Medical History:  Diagnosis Date  . Anxiety   . Arthritis   . Asthma   . Coronary artery disease   . Diabetes mellitus without complication ([place])   . Gout   . HOH (hard of hearing) bilateral  . Hypertension   . PONV (postoperative nausea and vomiting)   . Right thalamic infarction [facility]) 06/17/2019   Past Surgical History:  Past Surgical History:  Procedure Laterality Date  . CATARACT EXTRACTION W/PHACO  11/28/2011   Procedure: CATARACT EXTRACTION PHACO AND INTRAOCULAR LENS PLACEMENT (IOC);  Surgeon: Williams Che, MD;  Location: [facility];  Service: Ophthalmology;  Laterality: Right;  CDE 3.84  . CORONARY ARTERY BYPASS GRAFT  1997   6 vessels  . EYE SURGERY  1995   left eye cataract removal   . TOTAL KNEE  PROSTHESIS REMOVAL W/ SPACER INSERTION  8 yrs ago   right   Social History:  reports that he has never smoked. He has never used smokeless tobacco. He reports that he does not drink alcohol or use drugs.  Family / Support Systems Marital  Status: Married Patient Roles: Spouse, Parent Spouse/Significant Other: wife, Ayoub Arey @ (H) [phone] or [phone] Children: daughter, Sabino Niemann @ [phone] Anticipated Caregiver: wife (daugther and son-in-law) can assist at times as well Ability/Limitations of Caregiver: Min A Caregiver Availability: 24/7 Family Dynamics: Wife and daughter very supportive and involved/ willing to provide any needed assistance.  Social History Preferred language: English Religion: Non-Denominational Cultural Background: NA Read: Yes Write: Yes Employment Status: Retired Public relations account executive Issues: None Guardian/Conservator: None - per MD, pt is not capable of making decisions on his own behalf.   Abuse/Neglect Abuse/Neglect Assessment Can Be Completed: Yes Physical Abuse: Denies Verbal Abuse: Denies Sexual Abuse: Denies Exploitation of patient/patient's resources: Denies Self-Neglect: Denies  Emotional Status Pt's affect, behavior and adjustment status: Pt sitting up in w/c with wife present.  Very HOH and she assists with completion of assessment interview.  Pt denies any significant emotional distress.  Eager to get home ASAP.  Will monitor mood and refer for neurospychology as indicated. Recent Psychosocial Issues: None Psychiatric History: None Substance Abuse History: None  Patient / Family Perceptions, Expectations & Goals Pt/Family understanding of illness & functional limitations: Pt and wife with general understanding that he suffered a CVA and of current functional limitations/ need for CIR. Premorbid pt/family roles/activities: Pt was completely independent and active in home and community. Anticipated changes in roles/activities/participation: Per goals of CGA to min/ mod for cognition, wife will need  to provide 24/7 caregiver support. Pt/family expectations/goals: "I just hope I'm home soon."  [employer]: None Premorbid  Home Care/DME Agencies: None Transportation available at discharge: yes  Discharge Planning Living Arrangements: Spouse/significant other Support Systems: Spouse/significant other, Children Type of Residence: Private residence Insurance Resources: Chartered certified accountant Resources: [place] Referred: No Living Expenses: Own Money Management: Spouse Does the patient have any problems obtaining your medications?: No Home Management: shared between pt/ wife Patient/Family Preliminary Plans: Pt to return home with wife providing primary support. Social Work Anticipated Follow Up Needs: HH/OP Expected length of stay: 14-16 days  Clinical Impression Pleasant, elderly gentleman here following CVA and very HOH.  Wife present and assists with completion of assessment interview and will be able to provide 24/7 assistance at home.  Pt denies any emotional distress.  Hopeful for short LOS.  Will follow for support and d/c planning.  Mikenzi Raysor 06/20/2019, 3:12 PM

## 2019-06-20 NOTE — Progress Notes (Signed)
Occupational Therapy Session Note  Patient Details  Name: RHILEY SOLEM MRN: [id number] Date of Birth: [birth year]  Today's Date: 06/20/2019 OT Individual Time: 8875-7972 OT Individual Time Calculation (min): 35 min    Short Term Goals: Week 1:  OT Short Term Goal 1 (Week 1): Pt will demonstrate improved balance to stand at sink to complete grooming with CGA. OT Short Term Goal 2 (Week 1): Pt will be able to don pants with min A. OT Short Term Goal 3 (Week 1): Pt will be able to transfer to toilet with CGA. OT Short Term Goal 4 (Week 1): Pt will demonstrate improved awareness by stating where he is and what his medical condition is.  Skilled Therapeutic Interventions/Progress Updates:    1:1 Pt in bed when arrived. Pt and the bed were soaked with urine. PT came to EOB with min A with HOB elevated and bed rail. Min A stand pivot into the w/c. Pt performed min A transfers to the toilet. Max A to ambulate without device over to the shower- at first started out well but then difficulty with maintaining upright posture. Pt showered with overall mod A with extra time. Pt transferred into w/c with contact guard. Hospital gown donned in prep of next OT session.   Therapy Documentation Precautions:  Precautions Precautions: Fall Precaution Comments: HOH, delayed initiation for tasks Restrictions Weight Bearing Restrictions: No Pain: No c/o pain in session   Therapy/Group: Individual Therapy  Willeen Cass [facility] 06/20/2019, 10:18 AM

## 2019-06-20 NOTE — Progress Notes (Signed)
Physical Therapy Session Note  Patient Details  Name: [name] MRN: [id number] Date of Birth: [birth year]  Today's Date: 06/20/2019 PT Individual Time: 1330-1415 PT Individual Time Calculation (min): 45 min   Short Term Goals: Week 1:  PT Short Term Goal 1 (Week 1): Pt will ambulate 31f with RW min A PT Short Term Goal 2 (Week 1): Pt will navigate 3 steps min A with 1 rail PT Short Term Goal 3 (Week 1): Pt will transfer bed<>WC stand pivot min A  Skilled Therapeutic Interventions/Progress Updates:  Pt in wheelchair eating lunch with wife present in room upon arrival for therapy. Pt agreeable to treatment. Pt transported to the therapy gym. Pt performed sit>stand and ambulated 20 feetx4 with RW and minA. Pt required verbal cueing for proper use of the RW took seated rest breaks in between short bouts of gait d/t fatigue. PT noted crepitus and mild knee instability during ambulation. Pt noted mild increase of pain with ambulation. Then, pt completed sit>stand and remained standing with RW and minA during game of corn hole. Pt reached across body with R UE to grasp bean bag and then throw towards the corn hole board approximately 6 feet away. Pt returned to wheelchair and transported back to his room. Pt left sitting upright in chair, chair alarm set, and call bell within reach. Wife present in room. All needs met at this time.     Therapy Documentation Precautions:  Precautions Precautions: Fall Precaution Comments: HOH, delayed initiation for tasks Restrictions Weight Bearing Restrictions: No  Pain: pain in bilateral knees (4/10)    Therapy/Group: Individual Therapy  KOlena Leatherwood SPT  06/20/2019, 5:58 PM

## 2019-06-20 NOTE — Progress Notes (Signed)
[place] PHYSICAL MEDICINE & REHABILITATION PROGRESS NOTE  Subjective/Complaints: Patient seen sitting up in his chair this morning.  He believes he slept well overnight.  He is better able to engage in this morning.  ROS: Limited due to cognition  Objective: Vital Signs: Blood pressure (!) 189/84, pulse (!) 104, temperature 99.6 F (37.6 C), temperature source Oral, resp. rate 18, height 5\' 10"  (1.778 m), weight 80.9 kg, SpO2 98 %. No results found. Recent Labs    06/18/19 0451 06/20/19 0438  WBC 5.3 7.9  HGB 9.8* 10.6*  HCT 30.3* 32.0*  PLT 393 379   Recent Labs    06/17/19 1959 06/18/19 0451  NA  --  137  K  --  4.3  CL  --  105  CO2  --  21*  GLUCOSE  --  116*  BUN  --  37*  CREATININE 2.07* 1.81*  CALCIUM  --  9.1    Physical Exam: BP (!) 189/84 (BP Location: Left Arm)   Pulse (!) 104   Temp 99.6 F (37.6 C) (Oral)   Resp 18   Ht 5\' 10"  (1.778 m)   Wt 80.9 kg   SpO2 98%   BMI 25.59 kg/m  Constitutional: No distress . Vital signs reviewed. HENT: Normocephalic.  Atraumatic. Eyes: EOMI. No discharge. Cardiovascular: No JVD. Respiratory: Normal effort.  No stridor. GI: Non-distended. Skin: Warm and dry.  Intact. Psych: Unable to assess due to cognition.  Appears flat. 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 Neurological: Alert HOH Motor: Right upper extremity: 5/5 proximal distal Left upper extremity: Limited due to deformity Bilateral lower extremities: Hip flexion, knee extension 3 +-4-/5, ankle dorsiflexion 5/5, left weaker than right  Assessment/Plan: 1. Functional deficits secondary to right thalamic/internal capsule infarcts which require 3+ hours per day of interdisciplinary therapy in a comprehensive inpatient rehab setting.  Physiatrist is providing close team supervision and 24 hour management of active medical problems listed below.  Physiatrist and rehab team continue to assess barriers to  discharge/monitor patient progress toward functional and medical goals  Care Tool:  Bathing    Body parts bathed by patient: Left arm, Chest, Face, Abdomen, Front perineal area, Buttocks, Right upper leg, Left upper leg, Right lower leg   Body parts bathed by helper: Right arm     Bathing assist Assist Level: Minimal Assistance - Patient > 75%     Upper Body Dressing/Undressing Upper body dressing   What is the patient wearing?: Pull over shirt    Upper body assist Assist Level: Minimal Assistance - Patient > 75%    Lower Body Dressing/Undressing Lower body dressing      What is the patient wearing?: Incontinence brief, Pants     Lower body assist Assist for lower body dressing: Maximal Assistance - Patient 25 - 49%     Toileting Toileting    Toileting assist Assist for toileting: Minimal Assistance - Patient > 75%     Transfers Chair/bed transfer  Transfers assist     Chair/bed transfer assist level: Moderate Assistance - Patient 50 - 74%     Locomotion Ambulation   Ambulation assist      Assist level: Minimal Assistance - Patient > 75% Assistive device: Walker-rolling Max distance: 20 ft   Walk 10 feet activity   Assist     Assist level: Minimal Assistance - Patient > 75% Assistive device: Walker-rolling   Walk 50 feet activity   Assist Walk 50 feet with  2 turns activity did not occur: Safety/medical concerns(Fatigue and weakness)         Walk 150 feet activity   Assist Walk 150 feet activity did not occur: Safety/medical concerns         Walk 10 feet on uneven surface  activity   Assist Walk 10 feet on uneven surfaces activity did not occur: Safety/medical concerns         Wheelchair     Assist Will patient use wheelchair at discharge?: No             Wheelchair 50 feet with 2 turns activity    Assist            Wheelchair 150 feet activity     Assist            Medical Problem List  and Plan: 1.  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   Continue CIR 2.  Antithrombotics: -DVT/anticoagulation: Lovenox             -antiplatelet therapy: Aspirin 81 mg daily, Plavix 75 mg daily x3 months then Plavix alone 3. Pain Management: Tylenol as needed 4. Mood: Provide emotional support             -antipsychotic agents: N/A 5. Neuropsych: This patient is not capable of making decisions on his own behalf. 6. Skin/Wound Care: Routine skin checks 7. Fluids/Electrolytes/Nutrition: Routine in and outs 8.  Bilateral carotid stenosis.  Follow-up vascular surgery as outpatient. 9.  Diabetes mellitus with hyperglycemia.  Hemoglobin A1c 5.8.    Continue Glucotrol 5 mg daily.  Elevated on 10/8  Monitor with increased mobility 10. Essential hypertension.  Patient on Norvasc, Tenormin 100 mg daily and lisinopril 5 mg daily prior to admission.  Resume as needed  Continue Norvasc 5mg   Tenormin 50 daily started on 10/8  Very elevated with SBP is greater than 180.  Tachycardia also noted  Monitor with increased mobility 11.  CKD stage III.    Creatinine 1.81 on 10/6, labs ordered for tomorrow  Continue to monitor 12.  Hyperlipidemia.  Lipitor 13.  CAD with CABG 1997.  No chest pain or shortness of breath.  Continue aspirin and Plavix 14.  History of gout.  Zyloprim 300 mg daily.  Monitor for gout flareups 15.  Hard of hearing: Ensure patient wearing hearing aids 16.  History of asthma.  Monitor for increased shortness of breath.  Patient on albuterol inhaler as needed as well as theophylline 300 mg twice daily prior to admission. 17.  Hypoalbuminemia  Supplement initiated on 10/6 18.  Acute blood loss anemia  Hemoglobin 10.6 on 10/8  Continue to monitor  LOS: 3 days A FACE TO FACE EVALUATION WAS PERFORMED  Teala Daffron Lorie Phenix 06/20/2019, 9:14 AM

## 2019-06-20 NOTE — Progress Notes (Signed)
Occupational Therapy Session Note  Patient Details  Name: [name] MRN: [id number] Date of Birth: Dec 16, 1935  Today's Date: 06/20/2019 OT Individual Time: 6548-6885  OT Individual Time Calculation: 60 mins    Short Term Goals: Week 1:  OT Short Term Goal 1 (Week 1): Pt will demonstrate improved balance to stand at sink to complete grooming with CGA. OT Short Term Goal 2 (Week 1): Pt will be able to don pants with min A. OT Short Term Goal 3 (Week 1): Pt will be able to transfer to toilet with CGA. OT Short Term Goal 4 (Week 1): Pt will demonstrate improved awareness by stating where he is and what his medical condition is.      Skilled Therapeutic Interventions/Progress Updates:  Pt received in w/c with no c/o pain and ready for tx. Pt reporting he would like to shave; completes shaving at sink level seated in w/c with min A for shaving neck and around lips d/t perseveration in shaving certain areas. Pt completes oral care with (S) seated in w/c. Pt is (S) for UB dressing and mod A for standing balance for LB dressing. Pt is (S) for doffing and donning socks and min A for donning shoes with assist to tie. Pt transported to therapy apartment to practice simulated walk in shower transfer in preparation for safe transfers at home. Pt completes transfer with RW and min A with mod VC for walker management and R and L stepping. Pt completes functional sit <> stand transfers with min A at therapy apartment kitchen counter to increase endurance and standing balance and decrease pelvic tilt in standing. Exited session pt in w/c with safety belt on and all needs met.      Therapy Documentation Precautions:  Precautions Precautions: Fall Precaution Comments: HOH, delayed initiation for tasks Restrictions Weight Bearing Restrictions: No    Therapy/Group: Individual Therapy  Shamal Stracener 06/20/2019, 10:24 AM

## 2019-06-20 NOTE — Progress Notes (Signed)
Speech Language Pathology Daily Session Note  Patient Details  Name: [name] MRN: [id number] Date of Birth: Mar 19, 1936  Today's Date: 06/20/2019 SLP Individual Time: 1100-1200 SLP Individual Time Calculation (min): 60 min  Short Term Goals: Week 1: SLP Short Term Goal 1 (Week 1): Pt will demonstrate sustained attention to task in 1 minute intervals with mod A verbal cues for redirection. SLP Short Term Goal 2 (Week 1): Pt will demonstrate verbal and functional initation of requested task in 7/10 opportunties with max A verbal cues. SLP Short Term Goal 3 (Week 1): Pt will follow basic commands during functional tasks with min A verbal cues. SLP Short Term Goal 4 (Week 1): Pt will identify 1 cognitive and 1 physical deficit with max A verbal cues.  Skilled Therapeutic Interventions:  Skilled treatment session focused on cognition goals. Pt had both hearing aids in but continues to be very hard of hearing and required frequent repetition of information. SLP utilizied white board and orthographic cues to target cognition. When given written information, pt is able to decode and follow 1 step directions. His ability to follow 2 step directions declines to ~ 50% and improves to ~ 75% with Mod A cues to re-read information. This could be attributable to deficits in attention or memory or both. Additionally, pt unable to answer written orientation questions but able to recall correct answers after 20 and 40 minutes. With written prompts, pt able to name, sort, count and provide requested amounts of money with coins when given more than a reasonable amount of time. Pt struggled with generative naming task d/t deficits in sustained attention but didn't demonstrate any difficulty naming objects or coins. Pt left upright in wheelchair, lap belt alarm on and all needs within reach. Continue per current plan of care.  Pain    Therapy/Group: Individual Therapy  [name] 06/20/2019, 4:17  PM

## 2019-06-21 ENCOUNTER — Ambulatory Visit: Payer: Medicare Other | Admitting: Gastroenterology

## 2019-06-21 ENCOUNTER — Inpatient Hospital Stay (HOSPITAL_COMMUNITY): Payer: Medicare Other | Admitting: Physical Therapy

## 2019-06-21 ENCOUNTER — Inpatient Hospital Stay (HOSPITAL_COMMUNITY): Payer: Medicare Other

## 2019-06-21 ENCOUNTER — Inpatient Hospital Stay (HOSPITAL_COMMUNITY): Payer: Medicare Other | Admitting: Speech Pathology

## 2019-06-21 LAB — GLUCOSE, CAPILLARY
Glucose-Capillary: 100 mg/dL — ABNORMAL HIGH (ref 70–99)
Glucose-Capillary: 134 mg/dL — ABNORMAL HIGH (ref 70–99)
Glucose-Capillary: 147 mg/dL — ABNORMAL HIGH (ref 70–99)
Glucose-Capillary: 161 mg/dL — ABNORMAL HIGH (ref 70–99)

## 2019-06-21 MED ORDER — MUSCLE RUB 10-15 % EX CREA
TOPICAL_CREAM | CUTANEOUS | Status: DC | PRN
  Administered 2019-06-21 – 2019-06-29 (×4): via TOPICAL
  Filled 2019-06-21: qty 85

## 2019-06-21 NOTE — Progress Notes (Signed)
Physical Therapy Session Note  Patient Details  Name: [name] MRN: [id number] Date of Birth: 09-07-1936  Today's Date: 06/21/2019 PT Individual Time: 8127-5170 PT Individual Time Calculation (min): 55 min   Short Term Goals: Week 1:  PT Short Term Goal 1 (Week 1): Pt will ambulate 77ft with RW min A PT Short Term Goal 2 (Week 1): Pt will navigate 3 steps min A with 1 rail PT Short Term Goal 3 (Week 1): Pt will transfer bed<>WC stand pivot min A  Skilled Therapeutic Interventions/Progress Updates:  Pt received in w/c & agreeable to tx. Transported pt to/from gym via w/c dependent assist. Pt transfers sit>stand with RW & CGA & requires cuing & poor return demo to square up to surface & reach back before stand>sit. Pt ambulates 45 ft x 2 with RW & CGA with cuing to ambulate within base of RW and upright posture, and pt demonstrating significantly decreased gait speed. Pt negotiates 4 steps (6") with R ascending & descending rail with CGA, significantly extra time, and tactile cuing for compensatory pattern. Pt completes car transfer at [facility] simulated height with CGA over but max cuing for safety as pt attempts to push RW out of the way & attempts to step in.  Pt performed standing cone taps with BUE support on RW & min assist for balance with task focusing on weight shifting, dynamic balance, & LE coordination. Attempted to have pt perform task with only LUE support but pt continues to hold with BUE. At end of session pt left in w/c with chair alarm donned, call bell in reach.  Therapy Documentation Precautions:  Precautions Precautions: Fall Precaution Comments: HOH, delayed initiation for tasks Restrictions Weight Bearing Restrictions: No  Pain: Pt reports unrated L knee pain - rest breaks taken for pain relief.  Therapy/Group: Individual Therapy  Waunita Schooner 06/21/2019, 2:00 PM

## 2019-06-21 NOTE — Progress Notes (Signed)
Occupational Therapy Session Note  Patient Details  Name: [name] MRN: [id number] Date of Birth: [birth year]  Today's Date: 06/21/2019 OT Individual Time:  Session 1: 1015-1100 Session 2: 330-400  OT Individual Time Calculation:    Session 1: 45 mins    Session 2: 30 mins   Short Term Goals: Week 1:  OT Short Term Goal 1 (Week 1): Pt will demonstrate improved balance to stand at sink to complete grooming with CGA. OT Short Term Goal 2 (Week 1): Pt will be able to don pants with min A. OT Short Term Goal 3 (Week 1): Pt will be able to transfer to toilet with CGA. OT Short Term Goal 4 (Week 1): Pt will demonstrate improved awareness by stating where he is and what his medical condition is.      Skilled Therapeutic Interventions/Progress Updates:    Session 1: Pt received asleep supine in bed, c/o pain in L hip and requesting pain meds; RN consulted and reports he is not due meds. Pt continues to be agreeable to tx. Pt comes supine <> EOB with (S) and completes functional mobility from EOB to w/c with RW and min A. Pt completes UB bathing at sink level from w/c with min A to wash R armpit and completes shaving with (S) and min VC to terminate task d/t perseveration. Pt completes LB bathing with (S), reports not needing to wash buttocks. 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 Pt completes LB dressing with min A d/t time constraint. End of session pt in w/c, safety belt on and all needs met.   Session 2: Pt received sitting in w/c with wife present in room. Pt transported to therapy gym. Pt completed horshoes to address sit <> stand transfers, dynamic standing balance while reaching across midline. Pt transfers sit <> stand with CGA and is min A while standing >3 min d/t posterior lean. Pt used R arm to functionally reach for horsehoes placed on L side. Pt completes stepping onto 3" surface with min A for posterior lean and  balance. Pt perseverates on doing stairs this session to practice for d/c to home; OT recommended pt complete task with PT. End of session pt left in w/c with safety belt on and all needs met.   Therapy Documentation Precautions:  Precautions Precautions: Fall Precaution Comments: HOH, delayed initiation for tasks Restrictions Weight Bearing Restrictions: No      Therapy/Group: Individual Therapy  Darlyne Schmiesing 06/21/2019, 11:16 AM

## 2019-06-21 NOTE — Progress Notes (Signed)
Speech Language Pathology Daily Session Note  Patient Details  Name: [name] MRN: [id number] Date of Birth: [birth year]  Today's Date: 06/21/2019 SLP Individual Time: 0830-0930 SLP Individual Time Calculation (min): 60 min  Short Term Goals: Week 1: SLP Short Term Goal 1 (Week 1): Pt will demonstrate sustained attention to task in 1 minute intervals with mod A verbal cues for redirection. SLP Short Term Goal 2 (Week 1): Pt will demonstrate verbal and functional initation of requested task in 7/10 opportunties with max A verbal cues. SLP Short Term Goal 3 (Week 1): Pt will follow basic commands during functional tasks with min A verbal cues. SLP Short Term Goal 4 (Week 1): Pt will identify 1 cognitive and 1 physical deficit with max A verbal cues.  Skilled Therapeutic Interventions:  Skilled treatment session focused on cognition goals. SLP recieved pt upright in bed, bilateral hearing aids in place. Pt able to hear SLP when using moderately loud volume. 2 step auditory only directions given. Initially, SLP had pt repeat back the directions to check for accurate hearing of information. Verbal repetition faded and pt with much improved ability to implement directions - completed 11 out of 12 sets of 2 step directions. Pt able to recall orientation information and also demonstrated improved use of memory strategies such as using watch for day of week/date.  Pt was Mod I when sequencing 3 picture cards and verbally describing activity portrayed as well as demonstrating selective attention to task for ~ 45 minutes. With Min A cues, pt able to recall medicines. Pt left upright in bed, bed alarm on and all needs within reach. Continue per current plan of care.       Pain Pain Assessment Pain Scale: 0-10 Pain Score: 0-No pain Therapy/Group: Individual Therapy  Ivyonna Hoelzel 06/21/2019, 9:20 AM

## 2019-06-21 NOTE — Progress Notes (Signed)
[facility] PHYSICAL MEDICINE & REHABILITATION PROGRESS NOTE  Subjective/Complaints: Patient seen sitting up in bed this morning with therapies.  He indicates he slept well overnight.  He is alert and oriented, except for date of month with recall regarding yesterday's conversations with therapies.  ROS: Denies CP, shortness of breath, nausea, vomiting, diarrhea.  Objective: Vital Signs: Blood pressure (!) 139/58, pulse 66, temperature (!) 97.5 F (36.4 C), temperature source Oral, resp. rate 18, height 5\' 10"  (1.778 m), weight 80.9 kg, SpO2 97 %. No results found. Recent Labs    06/20/19 0438  WBC 7.9  HGB 10.6*  HCT 32.0*  PLT 379   No results for input(s): NA, K, CL, CO2, GLUCOSE, BUN, CREATININE, CALCIUM in the last 72 hours.  Physical Exam: BP (!) 139/58 (BP Location: Right Arm)   Pulse 66   Temp (!) 97.5 F (36.4 C) (Oral)   Resp 18   Ht 5\' 10"  (1.778 m)   Wt 80.9 kg   SpO2 97%   BMI 25.59 kg/m  Constitutional: No distress . Vital signs reviewed. HENT: Normocephalic.  Atraumatic. Eyes: EOMI. No discharge. Cardiovascular: No JVD. Respiratory: Normal effort.  No stridor. GI: Non-distended. Skin: Warm and dry.  Intact. Psych: Flat. 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 Neurological: Alert HOH Motor: Right upper extremity: 5/5 proximal distal Left upper extremity: Limited due to deformity Bilateral lower extremities: Hip flexion, knee extension 3 +-4-/5, ankle dorsiflexion 5/5, left weaker than right, unchanged  Assessment/Plan: 1. Functional deficits secondary to right thalamic/internal capsule infarcts which require 3+ hours per day of interdisciplinary therapy in a comprehensive inpatient rehab setting.  Physiatrist is providing close team supervision and 24 hour management of active medical problems listed below.  Physiatrist and rehab team continue to assess barriers to discharge/monitor patient progress toward functional and medical  goals  Care Tool:  Bathing    Body parts bathed by patient: Face   Body parts bathed by helper: Right arm     Bathing assist Assist Level: Supervision/Verbal cueing     Upper Body Dressing/Undressing Upper body dressing   What is the patient wearing?: Pull over shirt    Upper body assist Assist Level: Supervision/Verbal cueing    Lower Body Dressing/Undressing Lower body dressing      What is the patient wearing?: Pants     Lower body assist Assist for lower body dressing: Moderate Assistance - Patient 50 - 74%     Toileting Toileting    Toileting assist Assist for toileting: Minimal Assistance - Patient > 75%     Transfers Chair/bed transfer  Transfers assist     Chair/bed transfer assist level: Moderate Assistance - Patient 50 - 74%     Locomotion Ambulation   Ambulation assist      Assist level: Minimal Assistance - Patient > 75% Assistive device: Walker-rolling Max distance: 20 ft   Walk 10 feet activity   Assist     Assist level: Minimal Assistance - Patient > 75% Assistive device: Walker-rolling   Walk 50 feet activity   Assist Walk 50 feet with 2 turns activity did not occur: Safety/medical concerns(Fatigue and weakness)         Walk 150 feet activity   Assist Walk 150 feet activity did not occur: Safety/medical concerns         Walk 10 feet on uneven surface  activity   Assist Walk 10 feet on uneven surfaces activity did not occur: Safety/medical concerns  Wheelchair     Assist Will patient use wheelchair at discharge?: No             Wheelchair 50 feet with 2 turns activity    Assist            Wheelchair 150 feet activity     Assist            Medical Problem List and Plan: 1.  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  Old right basal ganglia and left occipital cortex  infarction.   Continue CIR 2.  Antithrombotics: -DVT/anticoagulation: Lovenox             -antiplatelet therapy: Aspirin 81 mg daily, Plavix 75 mg daily x3 months then Plavix alone 3. Pain Management: Tylenol as needed 4. Mood: Provide emotional support             -antipsychotic agents: N/A 5. Neuropsych: This patient is not capable of making decisions on his own behalf. 6. Skin/Wound Care: Routine skin checks 7. Fluids/Electrolytes/Nutrition: Routine in and outs 8.  Bilateral carotid stenosis.  Follow-up vascular surgery as outpatient. 9.  Diabetes mellitus with hyperglycemia.  Hemoglobin A1c 5.8.    Continue Glucotrol 5 mg daily.  Improving control on 10/9  Monitor with increased mobility 10. Essential hypertension.  Patient on Norvasc, Tenormin 100 mg daily and lisinopril 5 mg daily prior to admission.  Resume as needed  Continue Norvasc 5mg   Tenormin 50 daily started on 10/8  Still elevated, but?  Improving on 10/9  Monitor with increased mobility 11.  CKD stage III.    Creatinine 1.81 on 10/6, labs ordered for Monday  Continue to monitor 12.  Hyperlipidemia.  Lipitor 13.  CAD with CABG 1997.  No chest pain or shortness of breath.  Continue aspirin and Plavix 14.  History of gout.  Zyloprim 300 mg daily.  Monitor for gout flareups 15.  Hard of hearing: Ensure patient wearing hearing aids 16.  History of asthma.  Monitor for increased shortness of breath.  Patient on albuterol inhaler as needed as well as theophylline 300 mg twice daily prior to admission. 17.  Hypoalbuminemia  Supplement initiated on 10/6 18.  Acute blood loss anemia  Hemoglobin 10.6 on 10/8, labs ordered for Monday  Continue to monitor  LOS: 4 days A FACE TO FACE EVALUATION WAS PERFORMED  [place] Lorie Phenix 06/21/2019, 8:59 AM

## 2019-06-21 NOTE — Care Management (Signed)
[facility] Individual Statement of Services  Patient Name:  [name]  Date:  06/21/2019  [facility].  Our goal is to provide you with an individualized program based on your diagnosis and situation, designed to meet your specific needs.  With this comprehensive rehabilitation program, you will be expected to participate in at least 3 hours of rehabilitation therapies Monday-Friday, with modified therapy programming on the weekends.  Your rehabilitation program will include the following services:  Physical Therapy (PT), Occupational Therapy (OT), Speech Therapy (ST), 24 hour per day rehabilitation nursing, Therapeutic Recreaction (TR), Neuropsychology, Case Management (Social Worker), Rehabilitation Medicine, Nutrition Services and Pharmacy Services  Weekly team conferences will be held on Tuesdays to discuss your progress.  Your Social Worker will talk with you frequently to get your input and to update you on team discussions.  Team conferences with you and your family in attendance may also be held.  Expected length of stay: 14-16 days   Overall anticipated outcome: supervision tom min assist  Depending on your progress and recovery, your program may change. Your Social Worker will coordinate services and will keep you informed of any changes. Your Social Worker's name and contact numbers are listed  below.  The following services may also be recommended but are not provided by the [facility] will be made to provide these services after discharge if needed.  Arrangements include referral to agencies that provide these services.  Your insurance has been verified to be:  [facility] [employer] Your primary doctor is:  [place]  Pertinent information will be shared with your doctor and your insurance  company.  Social Worker:  [place], [place] or ([phone]   Information discussed with and copy given to patient by: Lennart Pall, 06/21/2019, 1:58 PM

## 2019-06-22 ENCOUNTER — Inpatient Hospital Stay (HOSPITAL_COMMUNITY): Payer: Medicare Other | Admitting: Physical Therapy

## 2019-06-22 ENCOUNTER — Inpatient Hospital Stay (HOSPITAL_COMMUNITY): Payer: Medicare Other | Admitting: Speech Pathology

## 2019-06-22 ENCOUNTER — Inpatient Hospital Stay (HOSPITAL_COMMUNITY): Payer: Medicare Other | Admitting: Occupational Therapy

## 2019-06-22 LAB — GLUCOSE, CAPILLARY
Glucose-Capillary: 120 mg/dL — ABNORMAL HIGH (ref 70–99)
Glucose-Capillary: 151 mg/dL — ABNORMAL HIGH (ref 70–99)
Glucose-Capillary: 184 mg/dL — ABNORMAL HIGH (ref 70–99)
Glucose-Capillary: 187 mg/dL — ABNORMAL HIGH (ref 70–99)

## 2019-06-22 MED ORDER — ALBUTEROL SULFATE (2.5 MG/3ML) 0.083% IN NEBU
2.5000 mg | INHALATION_SOLUTION | Freq: Four times a day (QID) | RESPIRATORY_TRACT | Status: DC | PRN
  Administered 2019-06-24: 2.5 mg via RESPIRATORY_TRACT
  Filled 2019-06-22: qty 3

## 2019-06-22 NOTE — Progress Notes (Signed)
Occupational Therapy Session Note  Patient Details  Name: [name] MRN: [id number] Date of Birth: 1935/10/12  Today's Date: 06/22/2019 OT Individual Time: 3825-0539 OT Individual Time Calculation (min): 90 min    Short Term Goals: Week 1:  OT Short Term Goal 1 (Week 1): Pt will demonstrate improved balance to stand at sink to complete grooming with CGA. OT Short Term Goal 2 (Week 1): Pt will be able to don pants with min A. OT Short Term Goal 3 (Week 1): Pt will be able to transfer to toilet with CGA. OT Short Term Goal 4 (Week 1): Pt will demonstrate improved awareness by stating where he is and what his medical condition is.  Skilled Therapeutic Interventions/Progress Updates:    Pt received in wc dressed and ready for the day. He declined showering.  Discussed his birthday today and pt was somewhat confused about where he was stating he was at his house and his wife was in the next room. She was not here during this session.    Pt taken to therapy gym and used RW to transfer to mat but with max cues as he tended push walker very far in front of him.    On therapy mat, he worked on card matching activity with mod cues to find Production manager.   Standing balance with a focus on trunk elongation with overhead reaching to place horse shoes on basket ball hoop and then removing and tossing onto horseshoe stand with good accuracy.  Sit to stand 5x in a row , 2 sets holding a large ball.  Pt requested to try rebounder which he did in sitting, tossing and catching with RUE with 60% accuracy.  Ambulation 20 ft to arm chair and back.  Improved upright posture but needed continual cues to not push walker forward.    Pt taken back to his room and his wife was present. She stated that prior to this CVA he was very clear cognitively, managed his own medication, drove.  She is aware that he does not realize he is in the hospital.  She said that he has recently walked with poor posture and  would push his rollator too far out in front.  Reviewed his progress with mobility and self care.  Pt in room with wife and RN and belt alarm on and all needs met.    Therapy Documentation Precautions:  Precautions Precautions: Fall Precaution Comments: HOH, delayed initiation for tasks Restrictions Weight Bearing Restrictions: No  Pain: Pain Assessment Pain Score: 0-No pain    Therapy/Group: Individual Therapy  [place] 06/22/2019, 2:45 PM

## 2019-06-22 NOTE — Progress Notes (Signed)
Speech Language Pathology Daily Session Note  Patient Details  Name: [name] MRN: [id number] Date of Birth: 09-24-1935  Today's Date: 06/22/2019 SLP Individual Time: 1030-1100 SLP Individual Time Calculation (min): 30 min  Short Term Goals: Week 1: SLP Short Term Goal 1 (Week 1): Pt will demonstrate sustained attention to task in 1 minute intervals with mod A verbal cues for redirection. SLP Short Term Goal 2 (Week 1): Pt will demonstrate verbal and functional initation of requested task in 7/10 opportunties with max A verbal cues. SLP Short Term Goal 3 (Week 1): Pt will follow basic commands during functional tasks with min A verbal cues. SLP Short Term Goal 4 (Week 1): Pt will identify 1 cognitive and 1 physical deficit with max A verbal cues.  Skilled Therapeutic Interventions:  Skilled treatment session focused on cognition goals. SLP facilitated session by providing Min A faded to supervision cues to follow 2 step (auditory only) directions. Pt able to follow accurately in 7 out of 10 opportunities. Pt also able to recall orientation information with auditory questions. TV was not initially turned off at beginning of session and as a result, pt was distracted and benefited from having it off dor laterr half of session. Pt left upright in wheelchair, lapbelt alarm on and eating ice cream. All needs were within reach. Continue per current plan of care.      Pain    Therapy/Group: Individual Therapy  Linwood Gullikson 06/22/2019, 10:54 AM

## 2019-06-22 NOTE — Progress Notes (Signed)
Physical Therapy Session Note  Patient Details  Name: [name] MRN: [id number] Date of Birth: 18-Nov-1935  Today's Date: 06/22/2019 PT Individual Time: 0835-0946 PT Individual Time Calculation (min): 71 min   Short Term Goals: Week 1:  PT Short Term Goal 1 (Week 1): Pt will ambulate 72ft with RW min A PT Short Term Goal 2 (Week 1): Pt will navigate 3 steps min A with 1 rail PT Short Term Goal 3 (Week 1): Pt will transfer bed<>WC stand pivot min A  Skilled Therapeutic Interventions/Progress Updates:  Pt received in w/c & agreeable to tx. Transported pt to gym & pt transfers sit>stand with supervision but cuing for hand placement & to initiate movement, stand>sit with min assist with therapist placing UE on w/c armrest as pt unable to follow commands to reach back. Pt ambulates 35 ft + 10 ft + 10 ft with RW & CGA/min assist with max cuing for upright posture & to ambulate within base of AD but pt with ongoing forward flexion & behaviors demonstrating pain in L knee - RN made aware & pain meds requested & she applied muscle rub to L knee, rest breaks also provided PRN for pain management. While standing EOM pt engaged in peg board activity with task focusing on cognitive remediation & standing balance without UE support & pt requiring close supervision for standing balance and up to max cuing for error correction & problem solving to assemble correct design from pre selected pieces. Pt performed 2" step taps with mod assist with task focusing on weight shifting L<>R & dynamic balance with pt demonstrating decreased ability to weight shift L to tap with RLE. Pt utilized kinetron in sitting for 1 minute x 3 bouts with task focusing on sustained attention & BLE strengthening & NMR. At end of session pt left in w/c with chair alarm donned & all needs in reach. At end of session pt left in w/c with chair alarm donned & all needs in reach.  Therapy Documentation Precautions:   Precautions Precautions: Fall Precaution Comments: HOH, delayed initiation for tasks Restrictions Weight Bearing Restrictions: No     Therapy/Group: Individual Therapy  Waunita Schooner 06/22/2019, 9:50 AM

## 2019-06-22 NOTE — Progress Notes (Addendum)
[place] PHYSICAL MEDICINE & REHABILITATION PROGRESS NOTE  Subjective/Complaints:   No C/os   ROS: Denies CP, shortness of breath, nausea, vomiting, diarrhea.  Objective: Vital Signs: Blood pressure (!) 146/53, pulse 75, temperature 98.8 F (37.1 C), temperature source Oral, resp. rate 18, height 5\' 10"  (1.778 m), weight 80.9 kg, SpO2 98 %. No results found. Recent Labs    06/20/19 0438  WBC 7.9  HGB 10.6*  HCT 32.0*  PLT 379   No results for input(s): NA, K, CL, CO2, GLUCOSE, BUN, CREATININE, CALCIUM in the last 72 hours.  Physical Exam: BP (!) 146/53 (BP Location: Right Arm)   Pulse 75   Temp 98.8 F (37.1 C) (Oral)   Resp 18   Ht 5\' 10"  (1.778 m)   Wt 80.9 kg   SpO2 98%   BMI 25.59 kg/m  Constitutional: No distress . Vital signs reviewed. HENT: Normocephalic.  Atraumatic. Eyes: EOMI. No discharge. Cardiovascular: No JVD. Respiratory: Normal effort.  No stridor. GI: Non-distended. Skin: Warm and dry.  Intact. Psych: Flat. 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 Neurological: Alert HOH Motor: Right upper extremity: 5/5 proximal distal Left upper extremity: Limited due to deformity, flexion deformity t elbow and finger flexors, ext deformity of wrist  Bilateral lower extremities: Hip flexion, knee extension 3 +-4-/5, ankle dorsiflexion 5/5, left weaker than right, unchanged  Assessment/Plan: 1. Functional deficits secondary to right thalamic/internal capsule infarcts which require 3+ hours per day of interdisciplinary therapy in a comprehensive inpatient rehab setting.  Physiatrist is providing close team supervision and 24 hour management of active medical problems listed below.  Physiatrist and rehab team continue to assess barriers to discharge/monitor patient progress toward functional and medical goals  Care Tool:  Bathing    Body parts bathed by patient: Left arm, Chest, Abdomen, Right upper leg, Left upper leg, Right lower  leg, Left lower leg, Face   Body parts bathed by helper: Right arm Body parts n/a: Buttocks, Front perineal area   Bathing assist Assist Level: Minimal Assistance - Patient > 75%     Upper Body Dressing/Undressing Upper body dressing   What is the patient wearing?: Pull over shirt    Upper body assist Assist Level: Minimal Assistance - Patient > 75%    Lower Body Dressing/Undressing Lower body dressing      What is the patient wearing?: Pants     Lower body assist Assist for lower body dressing: Minimal Assistance - Patient > 75%     Toileting Toileting    Toileting assist Assist for toileting: Minimal Assistance - Patient > 75%     Transfers Chair/bed transfer  Transfers assist     Chair/bed transfer assist level: Moderate Assistance - Patient 50 - 74%     Locomotion Ambulation   Ambulation assist      Assist level: Contact Guard/Touching assist Assistive device: Walker-rolling Max distance: 45 ft   Walk 10 feet activity   Assist     Assist level: Contact Guard/Touching assist Assistive device: Walker-rolling   Walk 50 feet activity   Assist Walk 50 feet with 2 turns activity did not occur: Safety/medical concerns(Fatigue and weakness)         Walk 150 feet activity   Assist Walk 150 feet activity did not occur: Safety/medical concerns         Walk 10 feet on uneven surface  activity   Assist Walk 10 feet on uneven surfaces activity did not occur: Safety/medical concerns  Wheelchair     Assist Will patient use wheelchair at discharge?: No             Wheelchair 50 feet with 2 turns activity    Assist            Wheelchair 150 feet activity     Assist            Medical Problem List and Plan: 1.  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  Old right basal ganglia and left occipital cortex  infarction.   Continue CIR 2.  Antithrombotics: -DVT/anticoagulation: Lovenox             -antiplatelet therapy: Aspirin 81 mg daily, Plavix 75 mg daily x3 months then Plavix alone 3. Pain Management: Tylenol as needed 4. Mood: Provide emotional support             -antipsychotic agents: N/A 5. Neuropsych: This patient is not capable of making decisions on his own behalf. 6. Skin/Wound Care: Routine skin checks 7. Fluids/Electrolytes/Nutrition: Routine in and outs 8.  Bilateral carotid stenosis.  Follow-up vascular surgery as outpatient. 9.  Diabetes mellitus with hyperglycemia.  Hemoglobin A1c 5.8.    Continue Glucotrol 5 mg daily.  Improving control on 10/9  Monitor with increased mobility 10. Essential hypertension.  Patient on Norvasc, Tenormin 100 mg daily and lisinopril 5 mg daily prior to admission.  Resume as needed Vitals:   06/21/19 2015 06/22/19 0458  BP: (!) 121/51 (!) 146/53  Pulse: 63 75  Resp: 19 18  Temp: (!) 97.4 F (36.3 C) 98.8 F (37.1 C)  SpO2: 100% 98%     Continue Norvasc 5mg   Tenormin 50 daily started on 72/5  Some systolic lability but overall controlled 10/10  Monitor with increased mobility 11.  CKD stage III.    Creatinine 1.81 on 10/6, labs ordered for Monday  Continue to monitor 12.  Hyperlipidemia.  Lipitor 13.  CAD with CABG 1997.  No chest pain or shortness of breath.  Continue aspirin and Plavix 14.  History of gout.  Zyloprim 300 mg daily.  Monitor for gout flareups 15.  Hard of hearing: Ensure patient wearing hearing aids 16.  History of asthma.  Monitor for increased shortness of breath.  Patient on albuterol inhaler as needed as well as theophylline 300 mg twice daily prior to admission. 17.  Hypoalbuminemia  Supplement initiated on 10/6 18.  Acute blood loss anemia  Hemoglobin 10.6 on 10/8, labs ordered for Monday  Continue to monitor 19.  Congential L brachial plexopathy with chronic contractures LOS: 5 days A FACE TO FACE  EVALUATION WAS PERFORMED  [name] 06/22/2019, 6:17 AM

## 2019-06-23 LAB — GLUCOSE, CAPILLARY
Glucose-Capillary: 158 mg/dL — ABNORMAL HIGH (ref 70–99)
Glucose-Capillary: 65 mg/dL — ABNORMAL LOW (ref 70–99)
Glucose-Capillary: 65 mg/dL — ABNORMAL LOW (ref 70–99)
Glucose-Capillary: 123 mg/dL — ABNORMAL HIGH (ref 70–99)
Glucose-Capillary: 158 mg/dL — ABNORMAL HIGH (ref 70–99)
Glucose-Capillary: 216 mg/dL — ABNORMAL HIGH (ref 70–99)

## 2019-06-23 NOTE — Significant Event (Signed)
Hypoglycemic Event  CBG: 65  Treatment: 4 oz juice/soda  Symptoms: None  Follow-up CBG: Time:1226 CBG Result:65  Possible Reasons for Event: Unknown  Comments/MD notified:Kirsteins    Barrett Shell

## 2019-06-23 NOTE — Significant Event (Signed)
Hypoglycemic Event  CBG: 65  Treatment: 4 oz juice/soda  Symptoms: None  Follow-up CBG: Time:1226 CBG Result:1337  Possible Reasons for Event: Change in activity and Unknown  Comments/MD notified:Kirsteins    [name], Devra Dopp

## 2019-06-23 NOTE — Progress Notes (Signed)
[place] PHYSICAL MEDICINE & REHABILITATION PROGRESS NOTE  Subjective/Complaints:   No issues overnite  ROS: Denies CP, shortness of breath, nausea, vomiting, diarrhea.  Objective: Vital Signs: Blood pressure (!) 144/56, pulse 63, temperature 98.4 F (36.9 C), temperature source Oral, resp. rate 18, height 5\' 10"  (1.778 m), weight 80.9 kg, SpO2 98 %. No results found. No results for input(s): WBC, HGB, HCT, PLT in the last 72 hours. No results for input(s): NA, K, CL, CO2, GLUCOSE, BUN, CREATININE, CALCIUM in the last 72 hours.  Physical Exam: BP (!) 144/56 (BP Location: Right Arm)   Pulse 63   Temp 98.4 F (36.9 C) (Oral)   Resp 18   Ht 5\' 10"  (1.778 m)   Wt 80.9 kg   SpO2 98%   BMI 25.59 kg/m  Constitutional: No distress . Vital signs reviewed. HENT: Normocephalic.  Atraumatic. Eyes: EOMI. No discharge. Cardiovascular: No JVD. Respiratory: Normal effort.  No stridor. GI: Non-distended. Skin: Warm and dry.  Intact. Psych: Flat. 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 Neurological: Alert HOH Motor: Right upper extremity: 5/5 proximal distal Left upper extremity: Limited due to deformity, flexion deformity t elbow and finger flexors, ext deformity of wrist  Bilateral lower extremities: Hip flexion, knee extension 3 +-4-/5, ankle dorsiflexion 5/5, left weaker than right, unchanged  Assessment/Plan: 1. Functional deficits secondary to right thalamic/internal capsule infarcts which require 3+ hours per day of interdisciplinary therapy in a comprehensive inpatient rehab setting.  Physiatrist is providing close team supervision and 24 hour management of active medical problems listed below.  Physiatrist and rehab team continue to assess barriers to discharge/monitor patient progress toward functional and medical goals  Care Tool:  Bathing    Body parts bathed by patient: Left arm, Chest, Abdomen, Right upper leg, Left upper leg, Right lower leg,  Left lower leg, Face   Body parts bathed by helper: Right arm Body parts n/a: Buttocks, Front perineal area   Bathing assist Assist Level: Minimal Assistance - Patient > 75%     Upper Body Dressing/Undressing Upper body dressing   What is the patient wearing?: Pull over shirt    Upper body assist Assist Level: Minimal Assistance - Patient > 75%    Lower Body Dressing/Undressing Lower body dressing      What is the patient wearing?: Pants     Lower body assist Assist for lower body dressing: Minimal Assistance - Patient > 75%     Toileting Toileting    Toileting assist Assist for toileting: Minimal Assistance - Patient > 75%     Transfers Chair/bed transfer  Transfers assist     Chair/bed transfer assist level: Moderate Assistance - Patient 50 - 74%     Locomotion Ambulation   Ambulation assist      Assist level: Minimal Assistance - Patient > 75% Assistive device: Walker-rolling Max distance: 35 ft   Walk 10 feet activity   Assist     Assist level: Minimal Assistance - Patient > 75% Assistive device: Walker-rolling   Walk 50 feet activity   Assist Walk 50 feet with 2 turns activity did not occur: Safety/medical concerns(Fatigue and weakness)         Walk 150 feet activity   Assist Walk 150 feet activity did not occur: Safety/medical concerns         Walk 10 feet on uneven surface  activity   Assist Walk 10 feet on uneven surfaces activity did not occur: Safety/medical concerns  Wheelchair     Assist Will patient use wheelchair at discharge?: No             Wheelchair 50 feet with 2 turns activity    Assist            Wheelchair 150 feet activity     Assist            Medical Problem List and Plan: 1.  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  Old right basal ganglia and left occipital cortex  infarction.   Continue CIR PT, OT, SLP  2.  Antithrombotics: -DVT/anticoagulation: Lovenox             -antiplatelet therapy: Aspirin 81 mg daily, Plavix 75 mg daily x3 months then Plavix alone 3. Pain Management: Tylenol as needed 4. Mood: Provide emotional support             -antipsychotic agents: N/A 5. Neuropsych: This patient is not capable of making decisions on his own behalf. 6. Skin/Wound Care: Routine skin checks 7. Fluids/Electrolytes/Nutrition: Routine in and outs 8.  Bilateral carotid stenosis.  Follow-up vascular surgery as outpatient. 9.  Diabetes mellitus with hyperglycemia.  Hemoglobin A1c 5.8.    Continue Glucotrol 5 mg daily.  Improving control on 10/9  Monitor with increased mobility 10. Essential hypertension.  Patient on Norvasc, Tenormin 100 mg daily and lisinopril 5 mg daily prior to admission.  Resume as needed Vitals:   06/22/19 2007 06/23/19 0509  BP: (!) 131/51 (!) 144/56  Pulse: 63 63  Resp: 18 18  Temp: 98.1 F (36.7 C) 98.4 F (36.9 C)  SpO2: 98% 98%  Good control    Continue Norvasc 5mg   Tenormin 50 daily started on 10/8    Monitor with increased mobility 11.  CKD stage III.    Creatinine 1.81 on 10/6, labs ordered for 10/12  Continue to monitor 12.  Hyperlipidemia.  Lipitor 13.  CAD with CABG 1997.  No chest pain or shortness of breath.  Continue aspirin and Plavix 14.  History of gout.  Zyloprim 300 mg daily.  Monitor for gout flareups 15.  Hard of hearing: Ensure patient wearing hearing aids 16.  History of asthma.  Monitor for increased shortness of breath.  Patient on albuterol inhaler as needed as well as theophylline 300 mg twice daily prior to admission. 17.  Hypoalbuminemia  Supplement initiated on 10/6 18.  Acute blood loss anemia  Hemoglobin 10.6 on 10/8, labs ordered for Monday  Continue to monitor 19.  Congential L brachial plexopathy with chronic contractures LOS: 6 days A FACE TO FACE EVALUATION WAS PERFORMED  Charlett Blake 06/23/2019, 7:25 AM

## 2019-06-24 ENCOUNTER — Inpatient Hospital Stay (HOSPITAL_COMMUNITY): Payer: Medicare Other | Admitting: Physical Therapy

## 2019-06-24 ENCOUNTER — Inpatient Hospital Stay (HOSPITAL_COMMUNITY): Payer: Medicare Other

## 2019-06-24 ENCOUNTER — Inpatient Hospital Stay (HOSPITAL_COMMUNITY): Payer: Medicare Other | Admitting: Speech Pathology

## 2019-06-24 LAB — CBC WITH DIFFERENTIAL/PLATELET
Abs Immature Granulocytes: 0.12 10*3/uL — ABNORMAL HIGH (ref 0.00–0.07)
Basophils Absolute: 0.1 10*3/uL (ref 0.0–0.1)
Basophils Relative: 1 %
Eosinophils Absolute: 0.5 10*3/uL (ref 0.0–0.5)
Eosinophils Relative: 6 %
HCT: 31.6 % — ABNORMAL LOW (ref 39.0–52.0)
Hemoglobin: 10.6 g/dL — ABNORMAL LOW (ref 13.0–17.0)
Immature Granulocytes: 1 %
Lymphocytes Relative: 17 %
Lymphs Abs: 1.5 10*3/uL (ref 0.7–4.0)
MCH: 29.6 pg (ref 26.0–34.0)
MCHC: 33.5 g/dL (ref 30.0–36.0)
MCV: 88.3 fL (ref 80.0–100.0)
Monocytes Absolute: 0.6 10*3/uL (ref 0.1–1.0)
Monocytes Relative: 7 %
Neutro Abs: 5.7 10*3/uL (ref 1.7–7.7)
Neutrophils Relative %: 68 %
Platelets: 363 10*3/uL (ref 150–400)
RBC: 3.58 MIL/uL — ABNORMAL LOW (ref 4.22–5.81)
RDW: 14.3 % (ref 11.5–15.5)
WBC: 8.5 10*3/uL (ref 4.0–10.5)
nRBC: 0 % (ref 0.0–0.2)

## 2019-06-24 LAB — BASIC METABOLIC PANEL
Anion gap: 10 (ref 5–15)
BUN: 47 mg/dL — ABNORMAL HIGH (ref 8–23)
CO2: 21 mmol/L — ABNORMAL LOW (ref 22–32)
Calcium: 9.4 mg/dL (ref 8.9–10.3)
Chloride: 107 mmol/L (ref 98–111)
Creatinine, Ser: 2.01 mg/dL — ABNORMAL HIGH (ref 0.61–1.24)
GFR calc Af Amer: 35 mL/min — ABNORMAL LOW (ref >60)
GFR calc non Af Amer: 30 mL/min — ABNORMAL LOW (ref >60)
Glucose, Bld: 112 mg/dL — ABNORMAL HIGH (ref 70–99)
Potassium: 5.1 mmol/L (ref 3.5–5.1)
Sodium: 138 mmol/L (ref 135–145)

## 2019-06-24 LAB — GLUCOSE, CAPILLARY
Glucose-Capillary: 102 mg/dL — ABNORMAL HIGH (ref 70–99)
Glucose-Capillary: 164 mg/dL — ABNORMAL HIGH (ref 70–99)
Glucose-Capillary: 160 mg/dL — ABNORMAL HIGH (ref 70–99)
Glucose-Capillary: 115 mg/dL — ABNORMAL HIGH (ref 70–99)

## 2019-06-24 NOTE — Progress Notes (Signed)
Occupational Therapy Session Note  Patient Details  Name: [name] MRN: [id number] Date of Birth: 12-02-1935  Today's Date: 06/24/2019 OT Individual Time: 1515-1630 OT Individual Time Calculation (min): 75 min    Short Term Goals: Week 1:  OT Short Term Goal 1 (Week 1): Pt will demonstrate improved balance to stand at sink to complete grooming with CGA. OT Short Term Goal 2 (Week 1): Pt will be able to don pants with min A. OT Short Term Goal 3 (Week 1): Pt will be able to transfer to toilet with CGA. OT Short Term Goal 4 (Week 1): Pt will demonstrate improved awareness by stating where he is and what his medical condition is.  Skilled Therapeutic Interventions/Progress Updates:    Pt received sitting up in the w/c with his wife present. Pt's wife verbose but very friendly and supportive throughout session. Pt agreeable to taking shower with no c/o pain. Pt used RW during sit > stand from w/c with min A. Pt completed ambulatory transfer into the bathroom with min A- CGA. Pt stood over toilet with min cueing for positioning with RW to void urine. Pt then transferred to [facility] in the walk in shower. He doffed all clothing with min A to remove distally. Pt washed UB and LB with CGA for standing level. Pt returned to w/c and donned shirt with CGA. Pt able to don pants with CGA as well. Shoe horn provided to assist pt with donning new shoes, mod A overall to tie. Pt then sat at the sink and completed oral care and shaving task with min A for thoroughness of shaving. Pt was left sitting up with all needs met, chair alarm set.   Therapy Documentation Precautions:  Precautions Precautions: Fall Precaution Comments: HOH, delayed initiation for tasks Restrictions Weight Bearing Restrictions: No   Therapy/Group: Individual Therapy  Curtis Sites 06/24/2019, 7:23 AM

## 2019-06-24 NOTE — Progress Notes (Signed)
Physical Therapy Weekly Progress Note  Patient Details  Name: [name] MRN: [id number] Date of Birth: [birth year]  Beginning of progress report period: June 18, 2019 End of progress report period: June 24, 2019  Today's Date: 06/24/2019 PT Individual Time: 1005-1100 PT Individual Time Calculation (min): 55 min   Patient has met 2 of 3 short term goals.  Pt is making consistent progress towards LTG's with encouragement. Pt currently requires CGA<>min assist for short distance gait with RW & CGA for stairs with 1 rail. Pt continues to be limited by impaired awareness & cognition & significantly delayed processing & initiation. Pt would benefit from continued skilled PT treatment to focus on the deficits noted above & below, to increase independence with mobility & caregiver training prior to pt's d/c.  Patient continues to demonstrate the following deficits muscle weakness, decreased cardiorespiratoy endurance, decreased coordination, decreased initiation, decreased attention, decreased awareness, decreased problem solving, decreased safety awareness, decreased memory and delayed processing, and decreased standing balance, decreased postural control and decreased balance strategies and therefore will continue to benefit from skilled PT intervention to increase functional independence with mobility.  Patient progressing toward long term goals.  Continue plan of care.  PT Short Term Goals Week 1:  PT Short Term Goal 1 (Week 1): Pt will ambulate 33f with RW min A PT Short Term Goal 1 - Progress (Week 1): Progressing toward goal PT Short Term Goal 2 (Week 1): Pt will navigate 3 steps min A with 1 rail PT Short Term Goal 2 - Progress (Week 1): Met PT Short Term Goal 3 (Week 1): Pt will transfer bed<>WC stand pivot min A PT Short Term Goal 3 - Progress (Week 1): Met Week 2:  PT Short Term Goal 1 (Week 2): STG = LTG due to ELOS.  Skilled Therapeutic Interventions/Progress Updates:   Pt received in bed & agreeable to tx. Pt unable to recall if his wife visited him over the weekend for his birthday or not. Pt completes bed mobility with bed flat, without rails, with supervision overall. Pt reports need to use restroom and ambulates in room/bathroom with RW & CGA with cuing & fair/poor return demo to ambulate within base of AD. Pt requires MAX cuing for turning to toilet and min assist for toilet transfer with RW & grab bar. Pt with continent void & BM & performs peri hygiene without assistance. Pt requires min assist to pull pants over hips when finished. Pt ambulates to sink with max cuing to square up to sink. Pt performs hand hygiene with cuing to use soap & brushes hair with comb but ultimately requires assistance to complete. Transported pt to ortho gym via w/c dependent assist. Pt completes ambulatory car transfer to sedan simulated height (pt reports his wife drives a [employer] with RW & CGA with max cuing overall for technique. Pt ambulates ~48 ft with RW & CGA with ongoing cuing to ambulate within base of RW; pt ultimately requires seated rest break 2/2 L knee pain & L knee buckling. At end of session pt left in w/c with chair alarm donned, call bell in reach, & RN in room.  Therapy Documentation Precautions:  Precautions Precautions: Fall Precaution Comments: HOH, delayed initiation for tasks Restrictions Weight Bearing Restrictions: No  Pain: Pt reports unrated pain in knee, rest breaks provided for pain management & pain meds requested.  Therapy/Group: Individual Therapy  VWaunita Schooner10/08/2019, 11:04 AM

## 2019-06-24 NOTE — Progress Notes (Signed)
[place] PHYSICAL MEDICINE & REHABILITATION PROGRESS NOTE  Subjective/Complaints: Lying in bed waiting for therapy. Says that left leg is hurting/tingling  ROS: Patient denies fever, rash, sore throat, blurred vision, nausea, vomiting, diarrhea, cough, shortness of breath or chest pain,  headache, or mood change.    Objective: Vital Signs: Blood pressure (!) 156/60, pulse 67, temperature 97.7 F (36.5 C), temperature source Oral, resp. rate 18, height 5\' 10"  (1.778 m), weight 82.7 kg, SpO2 99 %. No results found. Recent Labs    06/24/19 0711  WBC 8.5  HGB 10.6*  HCT 31.6*  PLT 363   Recent Labs    06/24/19 0711  NA 138  K 5.1  CL 107  CO2 21*  GLUCOSE 112*  BUN 47*  CREATININE 2.01*  CALCIUM 9.4    Physical Exam: BP (!) 156/60 (BP Location: Right Arm)   Pulse 67   Temp 97.7 F (36.5 C) (Oral)   Resp 18   Ht 5\' 10"  (1.778 m)   Wt 82.7 kg   SpO2 99%   BMI 26.16 kg/m  Constitutional: No distress . Vital signs reviewed. HEENT: EOMI, oral membranes moist Neck: supple Cardiovascular: RRR without murmur. No JVD    Respiratory: CTA Bilaterally without wheezes or rales. Normal effort    GI: BS +, non-tender, non-distended  Skin: Warm and dry.  Intact. Psych: Flat. 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 Neurological: Alert HOH Motor: Right upper extremity: 5/5 proximal distal Left upper extremity: Limited due to deformity, flexion deformity t elbow and finger flexors, ext deformity of wrist  Bilateral lower extremities: Hip flexion, knee extension 3 +-4-/5, ankle dorsiflexion 5/5, left weaker than right. Stable. Decreased light touch LUE and LLE, dysesthesias left leg?  Assessment/Plan: 1. Functional deficits secondary to right thalamic/internal capsule infarcts which require 3+ hours per day of interdisciplinary therapy in a comprehensive inpatient rehab setting.  Physiatrist is providing close team supervision and 24 hour management of  active medical problems listed below.  Physiatrist and rehab team continue to assess barriers to discharge/monitor patient progress toward functional and medical goals  Care Tool:  Bathing    Body parts bathed by patient: Left arm, Chest, Abdomen, Right upper leg, Left upper leg, Right lower leg, Left lower leg, Face   Body parts bathed by helper: Right arm Body parts n/a: Buttocks, Front perineal area   Bathing assist Assist Level: Minimal Assistance - Patient > 75%     Upper Body Dressing/Undressing Upper body dressing   What is the patient wearing?: Pull over shirt    Upper body assist Assist Level: Minimal Assistance - Patient > 75%    Lower Body Dressing/Undressing Lower body dressing      What is the patient wearing?: Pants     Lower body assist Assist for lower body dressing: Minimal Assistance - Patient > 75%     Toileting Toileting    Toileting assist Assist for toileting: Minimal Assistance - Patient > 75%     Transfers Chair/bed transfer  Transfers assist     Chair/bed transfer assist level: Moderate Assistance - Patient 50 - 74%     Locomotion Ambulation   Ambulation assist      Assist level: Minimal Assistance - Patient > 75% Assistive device: Walker-rolling Max distance: 35 ft   Walk 10 feet activity   Assist     Assist level: Minimal Assistance - Patient > 75% Assistive device: Walker-rolling   Walk 50 feet activity   Assist Walk 50  feet with 2 turns activity did not occur: Safety/medical concerns(Fatigue and weakness)         Walk 150 feet activity   Assist Walk 150 feet activity did not occur: Safety/medical concerns         Walk 10 feet on uneven surface  activity   Assist Walk 10 feet on uneven surfaces activity did not occur: Safety/medical concerns         Wheelchair     Assist Will patient use wheelchair at discharge?: No             Wheelchair 50 feet with 2 turns  activity    Assist            Wheelchair 150 feet activity     Assist            Medical Problem List and Plan: 1.  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   Continue CIR PT, OT, SLP  2.  Antithrombotics: -DVT/anticoagulation: Lovenox             -antiplatelet therapy: Aspirin 81 mg daily, Plavix 75 mg daily x3 months then Plavix alone 3. Pain Management: Tylenol as needed  -add low dose gabapentin for ?neuropathic pain LLE 4. Mood: Provide emotional support             -antipsychotic agents: N/A 5. Neuropsych: This patient is not capable of making decisions on his own behalf. 6. Skin/Wound Care: Routine skin checks 7. Fluids/Electrolytes/Nutrition: Routine in and outs 8.  Bilateral carotid stenosis.  Follow-up vascular surgery as outpatient. 9.  Diabetes mellitus with hyperglycemia.  Hemoglobin A1c 5.8.    Continue Glucotrol 5 mg daily.  Improving control on 10/12  ?watch for am hypoglycemia. (none this morning) 10. Essential hypertension.  Patient on Norvasc, Tenormin 100 mg daily and lisinopril 5 mg daily prior to admission.  Resume as needed Vitals:   06/23/19 1917 06/24/19 0458  BP: (!) 153/69 (!) 156/60  Pulse: 69 67  Resp: 19 18  Temp: 98.8 F (37.1 C) 97.7 F (36.5 C)  SpO2: 99% 99%  reasonable control 10/12   Continue Norvasc 5mg   Tenormin 50 daily started on 10/8 11.  CKD stage III.    Creatinine 1.81 on 10/6, Cr 2.0 today 10/12  Continue to monitor 12.  Hyperlipidemia.  Lipitor 13.  CAD with CABG 1997.  No chest pain or shortness of breath.  Continue aspirin and Plavix 14.  History of gout.  Zyloprim 300 mg daily.  Monitor for gout flareups 15.  Hard of hearing: Ensure patient wearing hearing aids 16.  History of asthma.  Monitor for increased shortness of breath.  Patient on albuterol  inhaler as needed as well as theophylline 300 mg twice daily prior to admission. 17.  Hypoalbuminemia  Supplement initiated on 10/6 18.  Acute blood loss anemia  Hemoglobin 10.6 on 10/8 and again on 10/12  Continue to monitor 19.  Congential L brachial plexopathy with chronic contractures--at baseline  LOS: 7 days A FACE TO FACE EVALUATION WAS PERFORMED  Meredith Staggers 06/24/2019, 9:29 AM

## 2019-06-24 NOTE — Progress Notes (Signed)
Speech Language Pathology Daily Session Note  Patient Details  Name: [name] MRN: [id number] Date of Birth: [birth year]  Today's Date: 06/24/2019 SLP Individual Time: 0730-0828 SLP Individual Time Calculation (min): 58 min  Short Term Goals: Week 1: SLP Short Term Goal 1 (Week 1): Pt will demonstrate sustained attention to task in 1 minute intervals with mod A verbal cues for redirection. SLP Short Term Goal 2 (Week 1): Pt will demonstrate verbal and functional initation of requested task in 7/10 opportunties with max A verbal cues. SLP Short Term Goal 3 (Week 1): Pt will follow basic commands during functional tasks with min A verbal cues. SLP Short Term Goal 4 (Week 1): Pt will identify 1 cognitive and 1 physical deficit with max A verbal cues.  Skilled Therapeutic Interventions: Pt was seen for skilled ST targeting cognitive goals. Pt was oriented X4, using his watch to determine today's date with Supervision A verbal prompt. SLP facilitated session with semi-complex medication management activity, during which pt was unable to recall any names of personal medications. He did state the function of 1 out of 9 medication accurately when SLP provided the name. Pt required Mod A for organization when interpreting a written list of current medications when setting up a BID pill box. Min A verbal cues provided for detection of 1 error (out of 2 opportunities), although pt verbalized and then demonstrated how to correct error with only Supervision A. Pt self-corrected one additional error during pill box administration. Due to time constraints, pt was unable to finish organizing his pill box, therefore we will target completion at next available session. Also of note, pt intermittently required multiple verbal repetition in order to carryout directions and/or answer therapists questions during session, suspect due to internal distractions. Pt was left in bed with alarm set and all needs within  reach. Continue per current plan of care.       Pain Pain Assessment Pain Scale: 0-10 Pain Score: 0-No pain  Therapy/Group: Individual Therapy  Arbutus Leas 06/24/2019, 9:55 AM

## 2019-06-25 ENCOUNTER — Inpatient Hospital Stay (HOSPITAL_COMMUNITY): Payer: Medicare Other

## 2019-06-25 ENCOUNTER — Inpatient Hospital Stay (HOSPITAL_COMMUNITY): Payer: Medicare Other | Admitting: Speech Pathology

## 2019-06-25 ENCOUNTER — Inpatient Hospital Stay (HOSPITAL_COMMUNITY): Payer: Medicare Other | Admitting: Physical Therapy

## 2019-06-25 ENCOUNTER — Inpatient Hospital Stay (HOSPITAL_COMMUNITY): Payer: Medicare Other | Admitting: Occupational Therapy

## 2019-06-25 LAB — GLUCOSE, CAPILLARY
Glucose-Capillary: 117 mg/dL — ABNORMAL HIGH (ref 70–99)
Glucose-Capillary: 152 mg/dL — ABNORMAL HIGH (ref 70–99)
Glucose-Capillary: 87 mg/dL (ref 70–99)
Glucose-Capillary: 175 mg/dL — ABNORMAL HIGH (ref 70–99)

## 2019-06-25 MED ORDER — ALBUTEROL SULFATE HFA 108 (90 BASE) MCG/ACT IN AERS
2.0000 | INHALATION_SPRAY | Freq: Four times a day (QID) | RESPIRATORY_TRACT | Status: DC | PRN
  Administered 2019-06-25 – 2019-06-29 (×4): 2 via RESPIRATORY_TRACT
  Filled 2019-06-25: qty 6.7

## 2019-06-25 NOTE — Plan of Care (Signed)
Distance decreased 2/2 pt's slow progress & limiting L knee pain.   Problem: RH Ambulation Goal: LTG Patient will ambulate in controlled environment (PT) Description: LTG: Patient will ambulate in a controlled environment, # of feet with assistance (PT). Flowsheets (Taken 06/25/2019 1347) LTG: Pt will ambulate in controlled environ  assist needed:: (downgrade 2/2 slow progress, limiting L knee pain) Contact Guard/Touching assist LTG: Ambulation distance in controlled environment: 50 ft with LRAD Note: downgrade 2/2 slow progress, limiting L knee pain Goal: LTG Patient will ambulate in home environment (PT) Description: LTG: Patient will ambulate in home environment, # of feet with assistance (PT). Flowsheets (Taken 06/25/2019 1347) LTG: Pt will ambulate in home environ  assist needed:: Contact Guard/Touching assist LTG: Ambulation distance in home environment: (downgrade 2/2 slow progress, limiting L knee pain) 25 ft with LRAD Note: downgrade 2/2 slow progress, limiting L knee pain

## 2019-06-25 NOTE — Progress Notes (Signed)
Speech Language Pathology Weekly Progress and Session Note  Patient Details  Name: [name] MRN: [id number] Date of Birth: 1936/02/07  Beginning of progress report period: June 18, 2019 End of progress report period: June 25, 2019  Today's Date: 06/25/2019 SLP Individual Time: 7017-7939 SLP Individual Time Calculation (min): 56 min  Short Term Goals: Week 1: SLP Short Term Goal 1 (Week 1): Pt will demonstrate sustained attention to task in 1 minute intervals with mod A verbal cues for redirection. SLP Short Term Goal 1 - Progress (Week 1): Met SLP Short Term Goal 2 (Week 1): Pt will demonstrate verbal and functional initation of requested task in 7/10 opportunties with max A verbal cues. SLP Short Term Goal 2 - Progress (Week 1): Met SLP Short Term Goal 3 (Week 1): Pt will follow basic commands during functional tasks with min A verbal cues. SLP Short Term Goal 3 - Progress (Week 1): Met SLP Short Term Goal 4 (Week 1): Pt will identify 1 cognitive and 1 physical deficit with max A verbal cues. SLP Short Term Goal 4 - Progress (Week 1): Met    New Short Term Goals: Week 2: SLP Short Term Goal 1 (Week 2): Pt will demonstrate sustained attention to task in 5 minute intervals with mod A verbal cues for redirection. SLP Short Term Goal 2 (Week 2): Pt will demonstrate verbal and functional initation of requested task in 7/10 opportunties with Mod A verbal cues. SLP Short Term Goal 3 (Week 2): Pt will identify 1 cognitive and 1 physical deficit with Mod A verbal cues. SLP Short Term Goal 4 (Week 2): Pt will demonstrate functional recall of daily information with Max A for use of compensatory memory strategies.  Weekly Progress Updates: Pt has made functional gains and met 4 out of 4 short term goals. Pt is currently ~Mod assist for due to cognitive impairments. Pt has demonstrated improved auditory comprehension, sustained attention, and intellectual awareness. Pt education is  ongoing. Pt would continue to benefit from skilled ST while inpatient in order to maximize functional independence and reduce burden of care prior to discharge. Anticipate that pt will need 24/7 supervision at discharge in addition to [place] follow up at next level of care.     Intensity: Minumum of 1-2 x/day, 30 to 90 minutes Frequency: 3 to 5 out of 7 days Duration/Length of Stay:   Treatment/Interventions: Cognitive remediation/compensation;Cueing hierarchy;Functional tasks;Environmental controls;Internal/external aids;Patient/family education   Daily Session  Skilled Therapeutic Interventions: Pt was seen for skilled ST targeting cognitive goals. SLP facilitated session with familiar semi-complex medication management activity. Pt organized a BID pill box 1X daily AM and PM pills with 100% accuracy following 1 model of how to organize 1X AM pills, and Min A verbal repetition of instructions. Pt also followed 10 out of 10 2-step directions with 100% accuracy given Min A verbal cues (auditory comprehension goals met). Mod A verbal cues for redirection was required throughout session (sustained attention for ~2-3 minute intervals). Pt continues to require Mod-Max A for initiation during structured activities. In functional conversation regarding ST goals, pt expressed feeling as though his "mind was coming back" and asked why we were targeting familiar activities such as organizing pill box. Although he stated awareness that he had been told he had a stroke, he reported he was "not so sure" that's what happened. SLP reviewed course of hospitalization and medical interventions/identification of stroke. Provided Max A verbal cues (multiple choice), pt identified 1 physical and 1 cognitive deficit.  Pt also reported difficulty recalling day to day information, such as info from MDs/RNs, etc. SLP noted decreased short term memory deficits throughout session, evidenced in his inability to recall any activities of  previous therapies (ST or otherwise) or safety precautions. Therefore, SLP created new LTG for short term memory to be targeted in future sessions. Pt was left sitting in wheelchair with seatbelt alarm engaged and all needs within reach. Continue per current plan of care.      Pain Pain Assessment Pain Score: 0-No pain  Therapy/Group: Individual Therapy  Arbutus Leas 06/25/2019, 9:56 AM

## 2019-06-25 NOTE — Patient Care Conference (Signed)
Inpatient RehabilitationTeam Conference and Plan of Care Update Date: 06/25/2019   Time: 10:10 AM    Patient Name: [name]      Medical Record Number: [id number]  Date of Birth: 1936/06/18 Sex: Male         Room/Bed: 4M02C/4M02C-01 Payor Info: Payor: Marine scientist / Plan: [facility] MEDICARE / Product Type: *No Product type* /    Admit Date/Time:  06/17/2019  6:57 PM  Primary Diagnosis:  Right thalamic infarction [facility])  Patient Active Problem List   Diagnosis Date Noted  . Labile blood glucose   . Acute blood loss anemia   . Hypoalbuminemia due to protein-calorie malnutrition ([place])   . Controlled type 2 diabetes mellitus with hyperglycemia, without long-term current use of insulin ([place])   . Right thalamic infarction ([place]) 06/17/2019  . Bilateral hearing loss   . Coronary artery disease involving coronary bypass graft of native heart without angina pectoris   . Diabetes mellitus type 2 in nonobese ([facility])   . Bilateral carotid artery stenosis   . History of CVA with residual deficit   . Essential hypertension 06/12/2019  . CKD (chronic kidney disease), stage III 06/12/2019  . CVA (cerebral vascular accident) ([place]) 06/11/2019  . Speech abnormality 06/10/2019    Expected Discharge Date: Expected Discharge Date: 06/29/19  Team Members Present: Physician leading conference: Dr. Alger Simons Social Worker Present: Lennart Pall, LCSW Nurse Present: Romilda Garret, LPN PT Present: Lavone Nian, PT OT Present: Laverle Hobby, OT SLP Present: Weston Anna, SLP PPS Coordinator present : Gunnar Fusi, SLP     Current Status/Progress Goal Weekly Team Focus  Bowel/Bladder   Continent of bowel and bladder rare occasional bladder incontinence LBM 10/12  Remain continent of B/B with normal bowel pattern  toilet Q3H and prn laxatives prn   Swallow/Nutrition/ Hydration             ADL's   CGA ADL transfers, CGA UB/LB dressing, slow processing  S overall  ADL  retraining, functional mobility, cognitive retraining/compensation, and functional activity tolerance   Mobility   supervision bed mobility, CGA<>min assist short distance gait with RW, CGA stairs with 1 rail, very slow processing & initiation  CGA<>supervision overall  transfers, gait, stair negotiation, awareness, cognition, balance, endurance, d/c planning, pt education   Communication   Min A comprehension  Min A  comprehension goals met   Safety/Cognition/ Behavioral Observations  Mod-Max A intellectual awareness, Mod A attention  Min-Mod A  sustained attention, intellectual awareness, compensatory memory strategies, initiating functional requests   Pain   Bilateral knee pain controlled with tylenol and menthol muscle rub  Remain free of pain  assess pain qshift and prn   Skin   Ecchymosis to right abdomen bilateral arms  No new skin impairment  assess skin q shift    Rehab Goals Patient on target to meet rehab goals: Yes *See Care Plan and progress notes for long and short-term goals.     Barriers to Discharge  Current Status/Progress Possible Resolutions Date Resolved   Nursing                  PT  Lack of/limited family support  unsure if family can provide hands on assist              OT                  SLP  SW                Discharge Planning/Teaching Needs:  Plan for home with wife and family to provide 24/7 assistance  Still need to complete all therapies   Team Discussion:  R thalamnic CVA; medically stable and BP good.  A&O x 3;  Cont b/b; +2 edema in left arm. Pain management better.  CGA - min A overall with with PT/OT and CGA goals.  Left knee buckling - ?due to meds. Slow processing , decreased awareness and attention.  Mod - max overall with cognition.  Still needs family ed with PT and ST.  Revisions to Treatment Plan:  NA    Medical Summary Current Status: right thalamic infarct. chronic left brachial plexus injury, HTN, DM Weekly  Focus/Goal: improve awareness, regulate bp and cbg's  Barriers to Discharge: Medical stability       Continued Need for Acute Rehabilitation Level of Care: The patient requires daily medical management by a physician with specialized training in physical medicine and rehabilitation for the following reasons: Direction of a multidisciplinary physical rehabilitation program to maximize functional independence : Yes Medical management of patient stability for increased activity during participation in an intensive rehabilitation regime.: Yes Analysis of laboratory values and/or radiology reports with any subsequent need for medication adjustment and/or medical intervention. : Yes   I attest that I was present, lead the team conference, and concur with the assessment and plan of the team.   [name] 06/25/2019, 11:30 AM   Team conference was held via web/ teleconference due to [place] - 19

## 2019-06-25 NOTE — Plan of Care (Signed)
  Problem: RH Comprehension Communication Goal: LTG Patient will comprehend basic/complex auditory (SLP) Description: LTG: Patient will comprehend basic/complex auditory information with cues (SLP). Outcome: Completed/Met

## 2019-06-25 NOTE — Progress Notes (Signed)
Occupational Therapy Session Note  Patient Details  Name: [name] MRN: [id number] Date of Birth: Nov 08, 1935  Today's Date: 06/25/2019 OT Individual Time: 1005-1100 OT Individual Time Calculation (min): 55 min    Short Term Goals: Week 1:  OT Short Term Goal 1 (Week 1): Pt will demonstrate improved balance to stand at sink to complete grooming with CGA. OT Short Term Goal 2 (Week 1): Pt will be able to don pants with min A. OT Short Term Goal 3 (Week 1): Pt will be able to transfer to toilet with CGA. OT Short Term Goal 4 (Week 1): Pt will demonstrate improved awareness by stating where he is and what his medical condition is. Week 2:     Skilled Therapeutic Interventions/Progress Updates:    1:1 Pt received in the w/c. Pt able to doff clothing sit to stand with extra time with min A. Pt transferred to and from the toilet with grab bar with contact guard with extra time and cues for coordination and safety. Pt does not recall where or how he showered the other day; forgetting the shower was in the bathroom next to the toilet. Pt performed contact guard transfer in and out of shower. PT able to bathe today sit to stand with contact guard with use of grab bar with standing. During dressing focus on sit to stands and standing balance without UE support; challenging his balance. Pt able to maintain balance with min a to contact guard. Pt able to tie one shoe but due to fatigue requested A to fasten other shoe. A pt with shaving due to his electric razor not cutting well today- usually pt can complete this task with setup/  Therapy Documentation Precautions:  Precautions Precautions: Fall Precaution Comments: HOH, delayed initiation for tasks Restrictions Weight Bearing Restrictions: No Pain:  no c/o pain    Therapy/Group: Individual Therapy  [facility] 06/25/2019, 2:23 PM

## 2019-06-25 NOTE — Progress Notes (Signed)
[name] PHYSICAL MEDICINE & REHABILITATION PROGRESS NOTE  Subjective/Complaints: Up working on pill Environmental education officer with SLP. Expressed frustration that he was having to work on something he had done previously on his own at home  ROS: Patient denies fever, rash, sore throat, blurred vision, nausea, vomiting, diarrhea, cough, shortness of breath or chest pain, joint or back pain, headache, or mood change.   Objective: Vital Signs: Blood pressure (!) 165/65, pulse 66, temperature 98.1 F (36.7 C), temperature source Oral, resp. rate 18, height 5\' 10"  (1.778 m), weight 82.7 kg, SpO2 98 %. No results found. Recent Labs    06/24/19 0711  WBC 8.5  HGB 10.6*  HCT 31.6*  PLT 363   Recent Labs    06/24/19 0711  NA 138  K 5.1  CL 107  CO2 21*  GLUCOSE 112*  BUN 47*  CREATININE 2.01*  CALCIUM 9.4    Physical Exam: BP (!) 165/65 (BP Location: Right Arm)   Pulse 66   Temp 98.1 F (36.7 C) (Oral)   Resp 18   Ht 5\' 10"  (1.778 m)   Wt 82.7 kg   SpO2 98%   BMI 26.16 kg/m  Constitutional: No distress . Vital signs reviewed. HEENT: EOMI, oral membranes moist Neck: supple Cardiovascular: RRR without murmur. No JVD    Respiratory: CTA Bilaterally without wheezes or rales. Normal effort    GI: BS +, non-tender, non-distended  Skin: Warm and dry.  Intact. Psych: Flat. 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 Neurological: Alert HOH Motor: Right upper extremity: 5/5 proximal distal Left upper extremity: Limited due to deformity, flexion deformity t elbow and finger flexors, ext deformity of wrist --no change Bilateral lower extremities: Hip flexion, knee extension 3 +-4-/5, ankle dorsiflexion 5/5, left weaker than right. Stable. Decreased light touch LUE and LLE, dysesthesias left leg.   Assessment/Plan: 1. Functional deficits secondary to right thalamic/internal capsule infarcts which require 3+ hours per day of interdisciplinary therapy in a  comprehensive inpatient rehab setting.  Physiatrist is providing close team supervision and 24 hour management of active medical problems listed below.  Physiatrist and rehab team continue to assess barriers to discharge/monitor patient progress toward functional and medical goals  Care Tool:  Bathing    Body parts bathed by patient: Left arm, Chest, Abdomen, Right upper leg, Left upper leg, Right lower leg, Left lower leg, Face, Right arm, Front perineal area, Buttocks   Body parts bathed by helper: Right arm Body parts n/a: Buttocks, Front perineal area   Bathing assist Assist Level: Contact Guard/Touching assist     Upper Body Dressing/Undressing Upper body dressing   What is the patient wearing?: Pull over shirt    Upper body assist Assist Level: Contact Guard/Touching assist    Lower Body Dressing/Undressing Lower body dressing      What is the patient wearing?: Pants     Lower body assist Assist for lower body dressing: Contact Guard/Touching assist     Toileting Toileting    Toileting assist Assist for toileting: Contact Guard/Touching assist     Transfers Chair/bed transfer  Transfers assist     Chair/bed transfer assist level: Minimal Assistance - Patient > 75%     Locomotion Ambulation   Ambulation assist      Assist level: Contact Guard/Touching assist Assistive device: Walker-rolling Max distance: 48 ft   Walk 10 feet activity   Assist     Assist level: Contact Guard/Touching assist Assistive device: Walker-rolling   Walk 50 feet  activity   Assist Walk 50 feet with 2 turns activity did not occur: Safety/medical concerns(Fatigue and weakness)         Walk 150 feet activity   Assist Walk 150 feet activity did not occur: Safety/medical concerns         Walk 10 feet on uneven surface  activity   Assist Walk 10 feet on uneven surfaces activity did not occur: Safety/medical concerns          Wheelchair     Assist Will patient use wheelchair at discharge?: No             Wheelchair 50 feet with 2 turns activity    Assist            Wheelchair 150 feet activity     Assist            Medical Problem List and Plan: 1.  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   Continue CIR PT, OT, SLP   -team conf today 2.  Antithrombotics: -DVT/anticoagulation: Lovenox             -antiplatelet therapy: Aspirin 81 mg daily, Plavix 75 mg daily x3 months then Plavix alone 3. Pain Management: Tylenol as needed  -added low dose gabapentin for ?neuropathic pain LLE--observe 4. Mood: Provide emotional support             -antipsychotic agents: N/A 5. Neuropsych: This patient is not capable of making decisions on his own behalf. 6. Skin/Wound Care: Routine skin checks 7. Fluids/Electrolytes/Nutrition: Routine in and outs 8.  Bilateral carotid stenosis.  Follow-up vascular surgery as outpatient. 9.  Diabetes mellitus with hyperglycemia.  Hemoglobin A1c 5.8.    Continue Glucotrol 5 mg daily.  Improving control on 10/13    10. Essential hypertension.  Patient on Norvasc, Tenormin 100 mg daily and lisinopril 5 mg daily prior to admission.  Resume as needed Vitals:   06/24/19 1942 06/25/19 0304  BP: (!) 145/51 (!) 165/65  Pulse: (!) 57 66  Resp: 18 18  Temp: 99.1 F (37.3 C) 98.1 F (36.7 C)  SpO2: 97% 98%  reasonable to borderline control 10/13   Continue Norvasc 5mg   Tenormin 50 daily started on 10/8 11.  CKD stage III.    Creatinine 1.81 on 10/6, Cr 2.0 today 10/12  Continue to monitor 12.  Hyperlipidemia.  Lipitor 13.  CAD with CABG 1997.  No chest pain or shortness of breath.  Continue aspirin and Plavix 14.  History of gout.  Zyloprim 300 mg daily.  Monitor for gout flareups 15.  Hard of hearing:  Ensure patient wearing hearing aids 16.  History of asthma.  Monitor for increased shortness of breath.  Patient on albuterol inhaler as needed as well as theophylline 300 mg twice daily prior to admission. 17.  Hypoalbuminemia  Supplement initiated on 10/6 18.  Acute blood loss anemia  Hemoglobin 10.6 on 10/8 and again on 10/12  Continue to monitor 19.  Congential L brachial plexopathy with chronic contractures--at baseline  LOS: 8 days A FACE TO FACE EVALUATION WAS PERFORMED  Meredith Staggers 06/25/2019, 9:07 AM

## 2019-06-25 NOTE — Progress Notes (Signed)
Physical Therapy Session Note  Patient Details  Name: [name] MRN: [id number] Date of Birth: 1935-12-12  Today's Date: 06/25/2019 PT Individual Time: 1305-1403 PT Individual Time Calculation (min): 58 min   Short Term Goals: Week 2:  PT Short Term Goal 1 (Week 2): STG = LTG due to ELOS.  Skilled Therapeutic Interventions/Progress Updates:  Pt received in w/c & agreeable to tx. Transported pt to/from gym via w/c dependent assist for time management. Pt ambulates 45 ft x 2 with RW & CGA. Pt transfers sit<>stand with supervision/CGA with max cuing to reach back for w/c prior to sitting & poor carryover of information. Before 2nd gait trial therapist donned L knee brace wife had dropped off in room & pain meds requested & administered by RN for L knee pain. Pt performed standing cone taps with CGA for balance & BUE support on RW with task focusing on weight shifting L<>R & BLE coordination. Attempted to have pt complete task with only RUE support but pt reports increased pain in this manner so BUE support utilized. Pt performed 10x sit<>stand without BUE support & min assist with focus on BLE & min cuing for sustained attention to keep count of repetitions. Pt returned to room & his wife [name]) arrived; educated her on estimated d/c date, f/u HHPT, & DME recommendations (transport w/c & RW), as well as need for family ed prior to pt's d/c. [name] reports she is having a ramp installed at their house for future use. Pt left in w/c with chair alarm donned & call bell in reach, wife present in room.  Therapy Documentation Precautions:  Precautions Precautions: Fall Precaution Comments: HOH, delayed initiation for tasks Restrictions Weight Bearing Restrictions: No    Therapy/Group: Individual Therapy  Waunita Schooner 06/25/2019, 2:07 PM

## 2019-06-25 NOTE — Progress Notes (Signed)
Physical Therapy Session Note  Patient Details  Name: [name] MRN: [id number] Date of Birth: 04-12-1936  Today's Date: 06/25/2019 PT Individual Time: 2297-9892 PT Individual Time Calculation (min): 30 min   Short Term Goals: Week 2:  PT Short Term Goal 1 (Week 2): STG = LTG due to ELOS.  Skilled Therapeutic Interventions/Progress Updates:     Patient in w/c with his wife, [name], in the room upon PT arrival. Patient alert and agreeable to PT session. Patient denied pain throughout session.  Gait Training:  Patient ambulated 60 feet using RW with CGA. Ambulated with decreased step length, narrow BOS, decreased gait speed, forward trunk flexion, and downward head gaze. Provided demonstration of ambulating with his hips between his hands using the RW and looking ahead prior to ambulation. Provided verbal cues for looking ahead to visual target, erect posture, and maintaining hips between his hands in the RW for increased safety with use of RW with ambulation to reduce fall risk.  Neuromuscular Re-ed: Patient performed block practice for sit<>stand training bed<>w/c x5 with cues for hand placement on RW, pushing up and reaching back with his R hand for safety, and sequencing for pivoting with the RW. Improved from very close supervision to distant supervision with activity and decreased time to complete task. Also progressed from 3-4 cues during transfers to 0 cues during last 2 transfers.   Patient in w/c with his wife in the room at end of session with breaks locked, seat belt alarm set, and all needs within reach.    Therapy Documentation Precautions:  Precautions Precautions: Fall Precaution Comments: HOH, delayed initiation for tasks Restrictions Weight Bearing Restrictions: No    Therapy/Group: Individual Therapy  Nayely Dingus L Sylvanna Burggraf PT, DPT  06/25/2019, 4:45 PM

## 2019-06-26 ENCOUNTER — Inpatient Hospital Stay (HOSPITAL_COMMUNITY): Payer: Medicare Other | Admitting: Physical Therapy

## 2019-06-26 ENCOUNTER — Inpatient Hospital Stay (HOSPITAL_COMMUNITY): Payer: Medicare Other

## 2019-06-26 ENCOUNTER — Inpatient Hospital Stay (HOSPITAL_COMMUNITY): Payer: Medicare Other | Admitting: Speech Pathology

## 2019-06-26 LAB — GLUCOSE, CAPILLARY
Glucose-Capillary: 103 mg/dL — ABNORMAL HIGH (ref 70–99)
Glucose-Capillary: 129 mg/dL — ABNORMAL HIGH (ref 70–99)
Glucose-Capillary: 98 mg/dL (ref 70–99)

## 2019-06-26 MED ORDER — NYSTATIN 100000 UNIT/GM EX CREA
TOPICAL_CREAM | Freq: Three times a day (TID) | CUTANEOUS | Status: DC
  Administered 2019-06-26 – 2019-06-29 (×7): via TOPICAL
  Filled 2019-06-26: qty 15

## 2019-06-26 NOTE — Progress Notes (Signed)
  Patient ID: [name], male   DOB: [birth year], 83 y.o.   MRN: [id number]      Diagnosis codes: I69.354  Height:  5'8"             Weight:   178 lbs         Patient suffers from right CVA with left hemiplegia which impairs their ability to perform daily activities like toileting, bathing, dressing and mobility in the home.  A walker will not resolve issue with performing activities of daily living.  A wheelchair will allow patient to safely perform daily activities.  Patient is not able to propel themselves in the home using a standard weight wheelchair due to hemiparesis and weakness.  A caregiver is available and willing to provide assistance with the wheelchair.  Lauraine Rinne, PA-C

## 2019-06-26 NOTE — Progress Notes (Signed)
Occupational Therapy Weekly Progress Note  Patient Details  Name: [name] MRN: [id number] Date of Birth: Sep 13, 1935  Beginning of progress report period: June 19, 2019 End of progress report period: June 26, 2019  Today's Date: 06/26/2019 OT Individual Time: 1415-1445 OT Individual Time Calculation (min): 30 min    Patient has met 2 of 3 short term goals.  Pt has made good progress this reporting period improving functional mobilty from MOD A with mild L lean to CGA with RW. Pt able to completes ADLs at S-CGA level with LRAD. Pt limited by decreased balance, limited functional use of LUE d/t birth deformity, and decreased endurance. Wife has been present during afternoon session but will benefit from family education prior to d/c home.   Patient continues to demonstrate the following deficits: muscle weakness, decreased cardiorespiratoy endurance, abnormal tone, unbalanced muscle activation, decreased coordination and decreased motor planning, decreased motor planning, decreased attention, decreased awareness, decreased problem solving, decreased safety awareness and decreased memory and decreased standing balance and therefore will continue to benefit from skilled OT intervention to enhance overall performance with BADL and iADL.  Patient progressing toward long term goals..  Continue plan of care.  OT Short Term Goals Week 1:  OT Short Term Goal 1 (Week 1): Pt will demonstrate improved balance to stand at sink to complete grooming with CGA. OT Short Term Goal 1 - Progress (Week 1): Met OT Short Term Goal 2 (Week 1): Pt will be able to don pants with min A. OT Short Term Goal 2 - Progress (Week 1): Met OT Short Term Goal 3 (Week 1): Pt will be able to transfer to toilet with CGA. OT Short Term Goal 4 (Week 1): Pt will demonstrate improved awareness by stating where he is and what his medical condition is. OT Short Term Goal 4 - Progress (Week 1): Progressing toward  goal Week 2:  OT Short Term Goal 1 (Week 2): stg=ltg d/t elos  Skilled Therapeutic Interventions/Progress Updates:    1:1. Pt received reporting need to toilet. Pt ambulates with CGA fading to S to stand over toilet with RW and VC for pushing RW over toilet for continent urine void. Pt reporting 5/10 knee pain in R knee and brace applied/tylenol provided. Pt completes ambulatory transfer into simulated shower stall with seat. Pt able ot complete with S using Rw and VC for management of RW. Pt provided RW bag and educated on transportation of items to keep BUE on walker for safety. Exited session with pt seated in w/c, call light in reach and all needs met  Therapy Documentation Precautions:  Precautions Precautions: Fall Precaution Comments: HOH, delayed initiation for tasks Restrictions Weight Bearing Restrictions: No General:   Vital Signs: Therapy Vitals Temp: 99.6 F (37.6 C) Temp Source: Oral Pulse Rate: 66 Resp: 16 BP: (!) 178/75 Patient Position (if appropriate): Sitting Oxygen Therapy SpO2: 99 % O2 Device: Room Air Pain: Pain Assessment Pain Scale: 0-10 Pain Score: 3  Pain Type: Chronic pain Pain Location: Knee Pain Orientation: Right;Left Pain Descriptors / Indicators: Aching Pain Frequency: Intermittent Pain Onset: On-going Patients Stated Pain Goal: 0 Pain Intervention(s): Medication (See eMAR) ADL: ADL Eating: Set up Grooming: Setup Where Assessed-Grooming: Sitting at sink Upper Body Bathing: Setup Where Assessed-Upper Body Bathing: Sitting at sink Lower Body Bathing: Minimal assistance(min A for balance) Where Assessed-Lower Body Bathing: Standing at sink Upper Body Dressing: Setup Where Assessed-Upper Body Dressing: Sitting at sink Lower Body Dressing: Maximal assistance Where Assessed-Lower Body Dressing: Standing  at sink, Sitting at sink Vision   Perception    Praxis   Exercises:   Other Treatments:     Therapy/Group: Individual  Therapy  Tonny Branch 06/26/2019, 6:54 AM

## 2019-06-26 NOTE — Progress Notes (Signed)
Speech Language Pathology Daily Session Note  Patient Details  Name: [name] MRN: [id number] Date of Birth: 06-26-1936  Today's Date: 06/26/2019 SLP Individual Time: 0715-0800 SLP Individual Time Calculation (min): 45 min  Short Term Goals: Week 2: SLP Short Term Goal 1 (Week 2): Pt will demonstrate sustained attention to task in 5 minute intervals with mod A verbal cues for redirection. SLP Short Term Goal 2 (Week 2): Pt will demonstrate verbal and functional initation of requested task in 7/10 opportunties with Mod A verbal cues. SLP Short Term Goal 3 (Week 2): Pt will identify 1 cognitive and 1 physical deficit with Mod A verbal cues. SLP Short Term Goal 4 (Week 2): Pt will demonstrate functional recall of daily information with Max A for use of compensatory memory strategies.  Skilled Therapeutic Interventions: Pt was seen for skilled ST targeting cognitive goals. Pt was easily arouse to soft voice and touch upon therapist's arrival. He transferred himself to sitting at the edge of the bed without assistance. Pt demonstrated greatly improved sustained attention to breakfast meal and structured activities during session today ~10 minute intervals with Min A verbal cues for redirections. Although pt's processing speed still presents as delayed, it was slightly improved today in comparison to previous session. In functional conversation, pt identified 2 compensatory memory strategies to assist with daily recall (use of schedule and taking notes) with Mod A question cues from clinician. He also recalled 1 activity previously targeted in ST (medication management) with Min A verbal cues for use of external aid. SLP further facilitated session with Mod faded to [place] A verbal cues for problem solving and use of compensatory memory strategy to recall instructions furing a novel semi-complex card game (Blink). Pt was left in bed with alarm set and all needs within reach. Continue per current plan  of care.      Pain Pain Assessment Pain Scale: 0-10 Pain Score: 0-No pain Pain Type: Chronic pain Pain Location: Knee Pain Orientation: Right;Left Pain Descriptors / Indicators: Aching Pain Frequency: Intermittent Pain Onset: On-going Patients Stated Pain Goal: 0 Pain Intervention(s): Medication (See eMAR)  Therapy/Group: Individual Therapy  Arbutus Leas 06/26/2019, 8:15 AM

## 2019-06-26 NOTE — Progress Notes (Signed)
[employer] PHYSICAL MEDICINE & REHABILITATION PROGRESS NOTE  Subjective/Complaints: Slept well overnight. No complaints this morning  ROS: Patient denies fever, rash, sore throat, blurred vision, nausea, vomiting, diarrhea, cough, shortness of breath or chest pain, joint or back pain, headache, or mood change.   Objective: Vital Signs: Blood pressure (!) 178/75, pulse 66, temperature 99.6 F (37.6 C), temperature source Oral, resp. rate 16, height 5\' 10"  (1.778 m), weight 82.7 kg, SpO2 99 %. No results found. Recent Labs    06/24/19 0711  WBC 8.5  HGB 10.6*  HCT 31.6*  PLT 363   Recent Labs    06/24/19 0711  NA 138  K 5.1  CL 107  CO2 21*  GLUCOSE 112*  BUN 47*  CREATININE 2.01*  CALCIUM 9.4    Physical Exam: BP (!) 178/75 (BP Location: Right Arm)   Pulse 66   Temp 99.6 F (37.6 C) (Oral)   Resp 16   Ht 5\' 10"  (1.778 m)   Wt 82.7 kg   SpO2 99%   BMI 26.16 kg/m  Constitutional: No distress . Vital signs reviewed. HEENT: EOMI, oral membranes moist Neck: supple Cardiovascular: RRR without murmur. No JVD    Respiratory: CTA Bilaterally without wheezes or rales. Normal effort    GI: BS +, non-tender, non-distended  Skin: Warm and dry.  Intact. Psych: Flat. 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 Neurological: Alert HOH Motor: Right upper extremity: grossly 5/5 proximal distal Left upper extremity: Limited due to deformity, flexion deformity t elbow and finger flexors, ext deformity of wrist --no change Bilateral lower extremities: Hip flexion, knee extension 3 +-4-/5, ankle dorsiflexion 5/5, left weaker than right.--no changes. Decreased light touch LUE and LLE is ongoing.    Assessment/Plan: 1. Functional deficits secondary to right thalamic/internal capsule infarcts which require 3+ hours per day of interdisciplinary therapy in a comprehensive inpatient rehab setting.  Physiatrist is providing close team supervision and 24  hour management of active medical problems listed below.  Physiatrist and rehab team continue to assess barriers to discharge/monitor patient progress toward functional and medical goals  Care Tool:  Bathing    Body parts bathed by patient: Left arm, Chest, Abdomen, Right upper leg, Left upper leg, Right lower leg, Left lower leg, Face, Right arm, Front perineal area, Buttocks   Body parts bathed by helper: Right arm Body parts n/a: Buttocks, Front perineal area   Bathing assist Assist Level: Contact Guard/Touching assist     Upper Body Dressing/Undressing Upper body dressing   What is the patient wearing?: Pull over shirt    Upper body assist Assist Level: Contact Guard/Touching assist    Lower Body Dressing/Undressing Lower body dressing      What is the patient wearing?: Underwear/pull up, Pants     Lower body assist Assist for lower body dressing: Contact Guard/Touching assist     Toileting Toileting    Toileting assist Assist for toileting: Contact Guard/Touching assist     Transfers Chair/bed transfer  Transfers assist     Chair/bed transfer assist level: Supervision/Verbal cueing Chair/bed transfer assistive device: Programmer, multimedia   Ambulation assist      Assist level: Contact Guard/Touching assist Assistive device: Walker-rolling Max distance: 60'   Walk 10 feet activity   Assist     Assist level: Contact Guard/Touching assist Assistive device: Walker-rolling   Walk 50 feet activity   Assist Walk 50 feet with 2 turns activity did not occur: Safety/medical concerns(Fatigue  and weakness)  Assist level: Contact Guard/Touching assist Assistive device: Walker-rolling    Walk 150 feet activity   Assist Walk 150 feet activity did not occur: Safety/medical concerns         Walk 10 feet on uneven surface  activity   Assist Walk 10 feet on uneven surfaces activity did not occur: Safety/medical concerns          Wheelchair     Assist Will patient use wheelchair at discharge?: No             Wheelchair 50 feet with 2 turns activity    Assist            Wheelchair 150 feet activity     Assist            Medical Problem List and Plan: 1.  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   Continue CIR PT, OT, SLP     2.  Antithrombotics: -DVT/anticoagulation: Lovenox             -antiplatelet therapy: Aspirin 81 mg daily, Plavix 75 mg daily x3 months then Plavix alone 3. Pain Management: Tylenol as needed  -continue low dose gabapentin for ?neuropathic pain LLE--observe for efficacy 4. Mood: Provide emotional support             -antipsychotic agents: N/A 5. Neuropsych: This patient is not capable of making decisions on his own behalf. 6. Skin/Wound Care: Routine skin checks 7. Fluids/Electrolytes/Nutrition: Routine in and outs 8.  Bilateral carotid stenosis.  Follow-up vascular surgery as outpatient. 9.  Diabetes mellitus with hyperglycemia.  Hemoglobin A1c 5.8.    Continue Glucotrol 5 mg daily.  Improved control on 10/14    10. Essential hypertension.  Patient on Norvasc, Tenormin 100 mg daily and lisinopril 5 mg daily prior to admission.  Resume as needed Vitals:   06/25/19 2007 06/26/19 0443  BP: 106/84 (!) 178/75  Pulse: (!) 57 66  Resp: 18 16  Temp: 98.8 F (37.1 C) 99.6 F (37.6 C)  SpO2: 99% 99%  increaesd SBP today---observe for pattern   Continue Norvasc 5mg   Tenormin 50 daily  11.  CKD stage III.    Creatinine 1.81 on 10/6, Cr 2.0  10/12  Continue to monitor 12.  Hyperlipidemia.  Lipitor 13.  CAD with CABG 1997.  No chest pain or shortness of breath.  Continue aspirin and Plavix 14.  History of gout.  Zyloprim 300 mg daily.  Monitor for gout flareups 15.  Hard of hearing: Ensure patient wearing  hearing aids 16.  History of asthma.  Monitor for increased shortness of breath.  Patient on albuterol inhaler as needed as well as theophylline 300 mg twice daily prior to admission. 17.  Hypoalbuminemia  Supplement initiated on 10/6 18.  Acute blood loss anemia  Hemoglobin 10.6 on 10/8 and again on 10/12  Continue to monitor 19.  [place] L brachial plexopathy with chronic contractures--at baseline  LOS: 9 days A FACE TO FACE EVALUATION WAS PERFORMED  Meredith Staggers 06/26/2019, 8:16 AM

## 2019-06-26 NOTE — Progress Notes (Signed)
Occupational Therapy Session Note  Patient Details  Name: [name] MRN: [id number] Date of Birth: 1936/08/11  Today's Date: 06/26/2019 OT Individual Time: 0945-1100 OT Individual Time Calculation (min): 75 min    Short Term Goals: Week 2:  OT Short Term Goal 1 (Week 2): stg=ltg d/t elos      Skilled Therapeutic Interventions/Progress Updates:  Pt received in w/c asleep; easily awoken and no c/o pain. Pt reports he wants to complete bathing at sink level. Pt completes LB bathing while standing at sink with CGA; pt completes UB bathing in standing requiring min A to wash R UE d/t birth defect in L hand. Pt completes oral care at sink level from w/c with (S). Pt is (S) for UB dressing and min A with LB dressing to thread feet into pants. Pt transported to therapy apartment to practice walk in shower transfer with RW; pt completes with CGA. Pt practiced sit <> stand from apartment couch to simulate sit <> stand from home furniture environment in preparation for d/c. Pt completes with CGA; OT education pt to sit to one side of the couch to use armrest to facilitate sit <> stand. Pt completes functional stepping exercise on therapy gym steps to simulate functional mobility on community surfaces and for participation in [place] at home. Exited session with pt in w/c, safety belt on and all needs met.      Therapy Documentation Precautions:  Precautions Precautions: Fall Precaution Comments: HOH, delayed initiation for tasks Restrictions Weight Bearing Restrictions: No       Therapy/Group: Individual Therapy  Prudence Heiny 06/26/2019, 1:46 PM

## 2019-06-26 NOTE — Progress Notes (Signed)
Physical Therapy Session Note  Patient Details  Name: [name] MRN: [id number] Date of Birth: Oct 20, 1935  Today's Date: 06/26/2019 PT Individual Time: 0806-0902 PT Individual Time Calculation (min): 56 min   Short Term Goals: Week 2:  PT Short Term Goal 1 (Week 2): STG = LTG due to ELOS.  Skilled Therapeutic Interventions/Progress Updates:  Pt received in room with NT assisting him out of bathroom with handoff to PT & pt assisted to w/c. Therapist donned B ted hose, L knee brace & assisting with shoes for time management. Transported pt to/from gym via w/c dependent assist for time management. Pt negotiates 4 steps (6") with R rail (ascending then descending) with CGA & min instructional cuing for compensatory pattern. In dayroom, pt ambulates ~50 ft with RW & CGA overall except min assist x 1 to step over pole as pt navigated obstacle course (weaving between cones, stepping over poles) with instructional cuing for RW management when stepping over poles. Pt ambulates 20 ft x 2 with RW & CGA. Pt was able to demonstrate proper turning to sitting surface & reaching back for w/c armrest ~50% of the time during session without cuing. Pt utilized nu-step on level 3 x 10 minutes with BLE & RUE with task focusing on global strengthening, LLE NMR, and endurance training. Pt required 1 rest break half way through 2/2 fatigue. At end of session pt left in w/c with chair alarm donned & all needs in reach.  Therapy Documentation Precautions:  Precautions Precautions: Fall Precaution Comments: HOH, delayed initiation for tasks Restrictions Weight Bearing Restrictions: No  Pain: Pt with behaviors 1x during session demonstrating L knee pain during gait, but no verbal c/o pain - rest breaks provided for pain management.   Therapy/Group: Individual Therapy  Waunita Schooner 06/26/2019, 9:04 AM

## 2019-06-27 ENCOUNTER — Inpatient Hospital Stay (HOSPITAL_COMMUNITY): Payer: Medicare Other | Admitting: Speech Pathology

## 2019-06-27 ENCOUNTER — Encounter (HOSPITAL_COMMUNITY): Payer: Medicare Other | Admitting: Speech Pathology

## 2019-06-27 ENCOUNTER — Inpatient Hospital Stay (HOSPITAL_COMMUNITY): Payer: Medicare Other

## 2019-06-27 ENCOUNTER — Ambulatory Visit (HOSPITAL_COMMUNITY): Payer: Medicare Other

## 2019-06-27 LAB — GLUCOSE, CAPILLARY
Glucose-Capillary: 78 mg/dL (ref 70–99)
Glucose-Capillary: 80 mg/dL (ref 70–99)
Glucose-Capillary: 214 mg/dL — ABNORMAL HIGH (ref 70–99)
Glucose-Capillary: 69 mg/dL — ABNORMAL LOW (ref 70–99)
Glucose-Capillary: 78 mg/dL (ref 70–99)

## 2019-06-27 MED ORDER — AMLODIPINE BESYLATE 5 MG PO TABS: 5.0000 mg | ORAL_TABLET | Freq: Every morning | ORAL | 0 refills | Status: DC

## 2019-06-27 MED ORDER — ACETAMINOPHEN 325 MG PO TABS: 650.0000 mg | ORAL_TABLET | ORAL | Status: AC | PRN

## 2019-06-27 MED ORDER — VITAMIN D 25 MCG (1000 UNIT) PO TABS: 1000.0000 [IU] | ORAL_TABLET | Freq: Every evening | ORAL | 0 refills | Status: AC

## 2019-06-27 MED ORDER — FAMOTIDINE 20 MG PO TABS: 20.0000 mg | ORAL_TABLET | Freq: Every day | ORAL | 0 refills | Status: AC

## 2019-06-27 MED ORDER — CLOPIDOGREL BISULFATE 75 MG PO TABS: 75.0000 mg | ORAL_TABLET | Freq: Every day | ORAL | 0 refills | Status: AC

## 2019-06-27 MED ORDER — ALBUTEROL SULFATE HFA 108 (90 BASE) MCG/ACT IN AERS: 1.0000 | INHALATION_SPRAY | Freq: Four times a day (QID) | RESPIRATORY_TRACT | 1 refills | Status: AC | PRN

## 2019-06-27 MED ORDER — ATENOLOL 50 MG PO TABS: 50.0000 mg | ORAL_TABLET | Freq: Every day | ORAL | 1 refills | Status: DC

## 2019-06-27 MED ORDER — GLIPIZIDE 5 MG PO TABS: 5.0000 mg | ORAL_TABLET | Freq: Every morning | ORAL | 1 refills | Status: AC

## 2019-06-27 MED ORDER — ALLOPURINOL 300 MG PO TABS: 300.0000 mg | ORAL_TABLET | Freq: Every day | ORAL | 0 refills | Status: AC

## 2019-06-27 MED ORDER — ATORVASTATIN CALCIUM 40 MG PO TABS: 20.0000 mg | ORAL_TABLET | Freq: Every day | ORAL | 0 refills | Status: AC

## 2019-06-27 NOTE — Discharge Summary (Signed)
Physician Discharge Summary  Patient ID: [name] MRN: [id number] DOB/AGE: 01/24/1936 83 y.o.  Admit date: 06/17/2019 Discharge date: 06/28/2019  Discharge Diagnoses:  Principal Problem:   Right thalamic infarction [facility]) Active Problems:   CVA (cerebral vascular accident) ([place])   Acute blood loss anemia   Hypoalbuminemia due to protein-calorie malnutrition ([place])   Controlled type 2 diabetes mellitus with hyperglycemia, without long-term current use of insulin (H[facility])   Labile blood glucose DVT prophylaxis Hypertension CKD stage III History of gout Hard of hearing History of asthma Congenital left brachial plexopathy with chronic contractures CAD with CABG  Discharged Condition: Stable  Significant Diagnostic Studies: Ct Angio Head W Or Wo Contrast  Result Date: 06/10/2019 CLINICAL DATA:  Acute altered mental status/confusion. Speech disturbance. EXAM: CT ANGIOGRAPHY HEAD AND NECK CT PERFUSION BRAIN TECHNIQUE: Multidetector CT imaging of the head and neck was performed using the standard protocol during bolus administration of intravenous contrast. Multiplanar CT image reconstructions and MIPs were obtained to evaluate the vascular anatomy. Carotid stenosis measurements (when applicable) are obtained utilizing NASCET criteria, using the distal internal carotid diameter as the denominator. Multiphase CT imaging of the brain was performed following IV bolus contrast injection. Subsequent parametric perfusion maps were calculated using RAPID software. CONTRAST:  174mL OMNIPAQUE IOHEXOL 350 MG/ML SOLN COMPARISON:  None. FINDINGS: CTA NECK FINDINGS Aortic arch: Standard 3 vessel aortic arch with moderate atherosclerotic plaque. Calcified plaque at both subclavian artery origins does not result in significant stenosis. Right carotid system: Patent with predominantly calcified plaque about the carotid bifurcation resulting in approximately 80% distal common carotid artery stenosis an  approximately 60% proximal ICA stenosis. Retropharyngeal course of the proximal ICA. Moderate ECA origin stenosis. Left carotid system: Patent with predominantly calcified plaque about the carotid bifurcation resulting in 80% proximal ICA stenosis. Partially retropharyngeal course of the proximal ICA. Vertebral arteries: The left vertebral artery is patent with a moderate to severe origin stenosis due to calcified plaque. The right vertebral artery is occluded at its origin with reconstitution of only a small portion of the V2 segment. Skeleton: No fracture or focal osseous lesion. Moderate to severe multilevel cervical disc and facet degeneration. Interbody and left-sided facet ankylosis at C4-5. Other neck: No evidence of cervical lymphadenopathy or mass. Upper chest: Mild apical lung scarring. Trace right pleural effusion. Review of the MIP images confirms the above findings CTA HEAD FINDINGS Anterior circulation: The internal carotid arteries are patent from skull base to carotid termini with relatively diffuse calcified plaque resulting in moderate right and mild left proximal supraclinoid stenoses. ACAs and MCAs are patent without evidence of proximal branch occlusion or significant proximal stenosis. Posterior circulation: The intracranial left vertebral artery is widely patent and supplies the basilar. Opacification of a small right V4 segment is likely retrograde. Patent left PICA, right AICA, and bilateral SCA origins are identified. The basilar artery is widely patent. There is a fetal origin of the left PCA. Moderate proximal right P2, severe distal left P2, and severe left greater than right P3 stenoses are present. No aneurysm is identified. Venous sinuses: Patent. Anatomic variants: Fetal left PCA. Review of the MIP images confirms the above findings CT Brain Perfusion Findings: ASPECTS: 10 CBF (<30%) Volume: 57mL Perfusion (Tmax>6.0s) volume: 38mL Mismatch Volume: 27mL Infarction Location: None evident  by CTP IMPRESSION: 1. Occluded right vertebral artery. 2. Patent left vertebral artery with moderate to severe stenosis of its origin. 3. 80% distal right common carotid artery stenosis and 60% proximal right ICA stenosis.  4. 80% proximal left ICA stenosis. 5. Moderate right and mild left intracranial ICA stenoses. 6. Negative CTP. 7.  Aortic Atherosclerosis (ICD10-I70.0). These results were called by telephone at the time of interpretation on 06/10/2019 at 2:47 pm to Dr. Veryl Speak , who verbally acknowledged these results. Electronically Signed   By: Logan Bores M.D.   On: 06/10/2019 15:18   Dg Elbow Complete Left  Result Date: 06/04/2019 CLINICAL DATA:  83 year old male with fall. EXAM: LEFT ELBOW - COMPLETE 3+ VIEW COMPARISON:  None. FINDINGS: Evaluation is limited due to suboptimal patient positioning and absence of a true 90 degree lateral view. No definite acute fracture identified on provided images. The bones are osteopenic. There is no dislocation. No significant joint effusion. There is fatty atrophy of musculature. Vascular calcifications noted. The soft tissues are otherwise unremarkable. IMPRESSION: 1. No definite acute fracture or dislocation. No significant joint effusion. 2. Osteopenia. Electronically Signed   By: Anner Crete M.D.   On: 06/04/2019 21:34   Ct Head Wo Contrast  Result Date: 06/10/2019 CLINICAL DATA:  Acute altered mental status/confusion. Improving speech abnormality. EXAM: CT HEAD WITHOUT CONTRAST TECHNIQUE: Contiguous axial images were obtained from the base of the skull through the vertex without intravenous contrast. COMPARISON:  06/04/2019 FINDINGS: Brain: There is no evidence of acute infarct, intracranial hemorrhage, mass, midline shift, or extra-axial fluid collection. 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 Small chronic cortical/subcortical infarcts are also again seen in the right parietal and left  occipital lobes. Patchy hypodensities elsewhere in the cerebral white matter bilaterally are unchanged and nonspecific but compatible with mild-to-moderate chronic small vessel ischemic disease. There is mild cerebral atrophy. Vascular: Calcified atherosclerosis at the skull base. No hyperdense vessel. Skull: No fracture or focal osseous lesion. Sinuses/Orbits: Mild right sphenoid sinus mucosal thickening. Clear mastoid air cells. Bilateral cataract extraction. Other: None. IMPRESSION: 1. No evidence of acute intracranial abnormality.  ASPECTS of 10. 2. Chronic small vessel ischemia with small chronic infarcts as above. These results were called by telephone at the time of interpretation on 06/10/2019 at 2:16 pm to Dr. Veryl Speak , who verbally acknowledged these results. Electronically Signed   By: Logan Bores M.D.   On: 06/10/2019 14:18   Ct Head Wo Contrast  Result Date: 06/04/2019 CLINICAL DATA:  [place] today. EXAM: CT HEAD WITHOUT CONTRAST CT CERVICAL SPINE WITHOUT CONTRAST TECHNIQUE: Multidetector CT imaging of the head and cervical spine was performed following the standard protocol without intravenous contrast. Multiplanar CT image reconstructions of the cervical spine were also generated. COMPARISON:  None. FINDINGS: CT HEAD FINDINGS Brain: Moderate atrophy. Chronic lacunar infarction right basal ganglia. Patchy hypodensity in the cerebral white matter bilaterally asymmetric in the right parietal lobe most consistent with chronic ischemia. Negative for acute infarct, hemorrhage, or mass. Vascular: Negative for hyperdense vessel Skull: Negative Sinuses/Orbits: Mild mucosal edema paranasal sinuses. Bilateral cataract surgery Other: None CT CERVICAL SPINE FINDINGS Alignment: Normal alignment.  Straightening of the cervical lordosis Skull base and vertebrae: Negative for fracture Soft tissues and spinal canal: Negative for soft tissue mass. Extensive atherosclerotic calcification in the carotid arteries  bilaterally. Disc levels: Multilevel disc degeneration and spondylosis. Mild facet degeneration. Multilevel spinal and foraminal stenosis. No areas of critical spinal stenosis. Upper chest: Lung apices clear bilaterally. Other: None IMPRESSION: Atrophy and chronic ischemic change. No acute intracranial abnormality Negative for cervical spine fracture.  Cervical spondylosis. Carotid artery disease. Electronically Signed   By: Juanda Crumble  Carlis Abbott M.D.   On: 06/04/2019 19:41   Ct Angio Neck W And/or Wo Contrast  Result Date: 06/10/2019 CLINICAL DATA:  Acute altered mental status/confusion. Speech disturbance. EXAM: CT ANGIOGRAPHY HEAD AND NECK CT PERFUSION BRAIN TECHNIQUE: Multidetector CT imaging of the head and neck was performed using the standard protocol during bolus administration of intravenous contrast. Multiplanar CT image reconstructions and MIPs were obtained to evaluate the vascular anatomy. Carotid stenosis measurements (when applicable) are obtained utilizing NASCET criteria, using the distal internal carotid diameter as the denominator. Multiphase CT imaging of the brain was performed following IV bolus contrast injection. Subsequent parametric perfusion maps were calculated using RAPID software. CONTRAST:  162mL OMNIPAQUE IOHEXOL 350 MG/ML SOLN COMPARISON:  None. FINDINGS: CTA NECK FINDINGS Aortic arch: Standard 3 vessel aortic arch with moderate atherosclerotic plaque. Calcified plaque at both subclavian artery origins does not result in significant stenosis. Right carotid system: Patent with predominantly calcified plaque about the carotid bifurcation resulting in approximately 80% distal common carotid artery stenosis an approximately 60% proximal ICA stenosis. Retropharyngeal course of the proximal ICA. Moderate ECA origin stenosis. Left carotid system: Patent with predominantly calcified plaque about the carotid bifurcation resulting in 80% proximal ICA stenosis. Partially retropharyngeal course of  the proximal ICA. Vertebral arteries: The left vertebral artery is patent with a moderate to severe origin stenosis due to calcified plaque. The right vertebral artery is occluded at its origin with reconstitution of only a small portion of the V2 segment. Skeleton: No fracture or focal osseous lesion. Moderate to severe multilevel cervical disc and facet degeneration. Interbody and left-sided facet ankylosis at C4-5. Other neck: No evidence of cervical lymphadenopathy or mass. Upper chest: Mild apical lung scarring. Trace right pleural effusion. Review of the MIP images confirms the above findings CTA HEAD FINDINGS Anterior circulation: The internal carotid arteries are patent from skull base to carotid termini with relatively diffuse calcified plaque resulting in moderate right and mild left proximal supraclinoid stenoses. ACAs and MCAs are patent without evidence of proximal branch occlusion or significant proximal stenosis. Posterior circulation: The intracranial left vertebral artery is widely patent and supplies the basilar. Opacification of a small right V4 segment is likely retrograde. Patent left PICA, right AICA, and bilateral SCA origins are identified. The basilar artery is widely patent. There is a fetal origin of the left PCA. Moderate proximal right P2, severe distal left P2, and severe left greater than right P3 stenoses are present. No aneurysm is identified. Venous sinuses: Patent. Anatomic variants: Fetal left PCA. Review of the MIP images confirms the above findings CT Brain Perfusion Findings: ASPECTS: 10 CBF (<30%) Volume: 59mL Perfusion (Tmax>6.0s) volume: 35mL Mismatch Volume: 37mL Infarction Location: None evident by CTP IMPRESSION: 1. Occluded right vertebral artery. 2. Patent left vertebral artery with moderate to severe stenosis of its origin. 3. 80% distal right common carotid artery stenosis and 60% proximal right ICA stenosis. 4. 80% proximal left ICA stenosis. 5. Moderate right and mild  left intracranial ICA stenoses. 6. Negative CTP. 7.  Aortic Atherosclerosis (ICD10-I70.0). These results were called by telephone at the time of interpretation on 06/10/2019 at 2:47 pm to Dr. Veryl Speak , who verbally acknowledged these results. Electronically Signed   By: Logan Bores M.D.   On: 06/10/2019 15:18   Ct Cervical Spine Wo Contrast  Result Date: 06/04/2019 CLINICAL DATA:  [place] today. EXAM: CT HEAD WITHOUT CONTRAST CT CERVICAL SPINE WITHOUT CONTRAST TECHNIQUE: Multidetector CT imaging of the head and cervical spine was performed following  the standard protocol without intravenous contrast. Multiplanar CT image reconstructions of the cervical spine were also generated. COMPARISON:  None. FINDINGS: CT HEAD FINDINGS Brain: Moderate atrophy. Chronic lacunar infarction right basal ganglia. Patchy hypodensity in the cerebral white matter bilaterally asymmetric in the right parietal lobe most consistent with chronic ischemia. Negative for acute infarct, hemorrhage, or mass. Vascular: Negative for hyperdense vessel Skull: Negative Sinuses/Orbits: Mild mucosal edema paranasal sinuses. Bilateral cataract surgery Other: None CT CERVICAL SPINE FINDINGS Alignment: Normal alignment.  Straightening of the cervical lordosis Skull base and vertebrae: Negative for fracture Soft tissues and spinal canal: Negative for soft tissue mass. Extensive atherosclerotic calcification in the carotid arteries bilaterally. Disc levels: Multilevel disc degeneration and spondylosis. Mild facet degeneration. Multilevel spinal and foraminal stenosis. No areas of critical spinal stenosis. Upper chest: Lung apices clear bilaterally. Other: None IMPRESSION: Atrophy and chronic ischemic change. No acute intracranial abnormality Negative for cervical spine fracture.  Cervical spondylosis. Carotid artery disease. Electronically Signed   By: Franchot Gallo M.D.   On: 06/04/2019 19:41   Mr Brain Wo Contrast  Result Date:  06/11/2019 CLINICAL DATA:  Acute onset garbled speech EXAM: MRI HEAD WITHOUT CONTRAST TECHNIQUE: Multiplanar, multiecho pulse sequences of the brain and surrounding structures were obtained without intravenous contrast. COMPARISON:  Head CT, CTA, and CT perfusion from yesterday FINDINGS: Brain: Small areas of restricted diffusion along the medial right thalamus and right corona radiata. Remote lacunar infarct with mineralization at the right basal ganglia. Small remote left occipital cortex infarct. Moderate chronic small vessel ischemic type gliosis in the cerebral white matter. Mild-to-moderate generalized volume loss. No acute hemorrhage, hydrocephalus, or masslike finding. Vascular: Absent right vertebral flow void which correlates with CTA. Other major flow voids are preserved. Skull and upper cervical spine: Negative for marrow lesion Sinuses/Orbits: No emergent finding. There is minimal left mastoid opacification with negative nasopharynx. Bilateral cataract resection. IMPRESSION: 1. Small acute infarcts at the medial right thalamus and right internal capsule. 2. Chronic ischemic injury including remote right basal ganglia infarct and left occipital cortex infarct. Electronically Signed   By: Monte Fantasia M.D.   On: 06/11/2019 04:35   Ct Abdomen Pelvis W Contrast  Result Date: 06/04/2019 CLINICAL DATA:  83 year old male with acute generalized abdominal pain. EXAM: CT ABDOMEN AND PELVIS WITH CONTRAST TECHNIQUE: Multidetector CT imaging of the abdomen and pelvis was performed using the standard protocol following bolus administration of intravenous contrast. CONTRAST:  59mL OMNIPAQUE IOHEXOL 300 MG/ML  SOLN COMPARISON:  Renal ultrasound dated 06/12/2015 FINDINGS: Lower chest: Partially visualized small right pleural effusion. Subsegmental bibasilar atelectasis versus infiltrate. Faint nodular density at the right lung base may represent developing infiltrate. Clinical correlation is recommended. There is  multi vessel coronary vascular calcification and calcification of the visualized thoracic aorta. No intra-abdominal free air or free fluid. Hepatobiliary: The liver is unremarkable. No intrahepatic biliary ductal dilatation. Multiple stones noted in the proximal gallbladder. No pericholecystic fluid or evidence of acute cholecystitis by CT. Pancreas: Unremarkable. No pancreatic ductal dilatation or surrounding inflammatory changes. Spleen: Normal in size without focal abnormality. Adrenals/Urinary Tract: Small calcific foci in the left adrenal gland, likely sequela of prior insult. There is a 3 cm fat containing right adrenal nodule most consistent with an adenoma versus a myelolipoma. Several small calcification within this nodule likely sequela of prior insult. There are multiple bilateral renal cysts measure up to 3.5 cm in the superior pole of the right kidney. Additional smaller hypodense lesions are not well characterized. There is  no hydronephrosis on either side. There is symmetric enhancement and excretion of contrast by both kidneys. The visualized ureters and urinary bladder appear unremarkable. Stomach/Bowel: There is diffuse thickening of distal esophagus with inflammatory changes which may represent esophagitis. An infiltrative esophageal neoplasm is not entirely excluded but favored less likely. Clinical correlation and follow-up recommended. There is sigmoid diverticulosis without active inflammatory changes. Moderate stool noted throughout the colon. There is no bowel obstruction. The appendix is normal. Vascular/Lymphatic: Moderate aortoiliac atherosclerotic disease. The IVC is unremarkable. No portal venous gas. No adenopathy. Top-normal passive visual lymph node measures approximately 7 mm in short axis. Reproductive: The prostate and seminal vesicles are grossly unremarkable. Other: None Musculoskeletal: Degenerative changes of the spine. Multilevel disc desiccation and vacuum phenomena. No  acute osseous pathology. IMPRESSION: 1. Findings most consistent with esophagitis. An esophageal neoplasm is favored less likely but not excluded. Clinical correlation and follow-up recommended. 2. Cholelithiasis. 3. Sigmoid diverticulosis. No bowel obstruction or active inflammation. Normal appendix. Aortic Atherosclerosis (ICD10-I70.0). Electronically Signed   By: Anner Crete M.D.   On: 06/04/2019 19:50   Ct Cerebral Perfusion W Contrast  Result Date: 06/10/2019 CLINICAL DATA:  Acute altered mental status/confusion. Speech disturbance. EXAM: CT ANGIOGRAPHY HEAD AND NECK CT PERFUSION BRAIN TECHNIQUE: Multidetector CT imaging of the head and neck was performed using the standard protocol during bolus administration of intravenous contrast. Multiplanar CT image reconstructions and MIPs were obtained to evaluate the vascular anatomy. Carotid stenosis measurements (when applicable) are obtained utilizing NASCET criteria, using the distal internal carotid diameter as the denominator. Multiphase CT imaging of the brain was performed following IV bolus contrast injection. Subsequent parametric perfusion maps were calculated using RAPID software. CONTRAST:  133mL OMNIPAQUE IOHEXOL 350 MG/ML SOLN COMPARISON:  None. FINDINGS: CTA NECK FINDINGS Aortic arch: Standard 3 vessel aortic arch with moderate atherosclerotic plaque. Calcified plaque at both subclavian artery origins does not result in significant stenosis. Right carotid system: Patent with predominantly calcified plaque about the carotid bifurcation resulting in approximately 80% distal common carotid artery stenosis an approximately 60% proximal ICA stenosis. Retropharyngeal course of the proximal ICA. Moderate ECA origin stenosis. Left carotid system: Patent with predominantly calcified plaque about the carotid bifurcation resulting in 80% proximal ICA stenosis. Partially retropharyngeal course of the proximal ICA. Vertebral arteries: The left vertebral  artery is patent with a moderate to severe origin stenosis due to calcified plaque. The right vertebral artery is occluded at its origin with reconstitution of only a small portion of the V2 segment. Skeleton: No fracture or focal osseous lesion. Moderate to severe multilevel cervical disc and facet degeneration. Interbody and left-sided facet ankylosis at C4-5. Other neck: No evidence of cervical lymphadenopathy or mass. Upper chest: Mild apical lung scarring. Trace right pleural effusion. Review of the MIP images confirms the above findings CTA HEAD FINDINGS Anterior circulation: The internal carotid arteries are patent from skull base to carotid termini with relatively diffuse calcified plaque resulting in moderate right and mild left proximal supraclinoid stenoses. ACAs and MCAs are patent without evidence of proximal branch occlusion or significant proximal stenosis. Posterior circulation: The intracranial left vertebral artery is widely patent and supplies the basilar. Opacification of a small right V4 segment is likely retrograde. Patent left PICA, right AICA, and bilateral SCA origins are identified. The basilar artery is widely patent. There is a fetal origin of the left PCA. Moderate proximal right P2, severe distal left P2, and severe left greater than right P3 stenoses are present. No aneurysm is  identified. Venous sinuses: Patent. Anatomic variants: Fetal left PCA. Review of the MIP images confirms the above findings CT Brain Perfusion Findings: ASPECTS: 10 CBF (<30%) Volume: 41mL Perfusion (Tmax>6.0s) volume: 13mL Mismatch Volume: 27mL Infarction Location: None evident by CTP IMPRESSION: 1. Occluded right vertebral artery. 2. Patent left vertebral artery with moderate to severe stenosis of its origin. 3. 80% distal right common carotid artery stenosis and 60% proximal right ICA stenosis. 4. 80% proximal left ICA stenosis. 5. Moderate right and mild left intracranial ICA stenoses. 6. Negative CTP. 7.   Aortic Atherosclerosis (ICD10-I70.0). These results were called by telephone at the time of interpretation on 06/10/2019 at 2:47 pm to Dr. Veryl Speak , who verbally acknowledged these results. Electronically Signed   By: Logan Bores M.D.   On: 06/10/2019 15:18   Dg Chest Portable 1 View  Result Date: 06/04/2019 CLINICAL DATA:  83 year old male with fall. EXAM: PORTABLE CHEST 1 VIEW COMPARISON:  Chest CT dated 06/04/2019 FINDINGS: There is mild cardiomegaly with mild vascular congestion. Small bilateral pleural effusions may be present. No focal consolidation or pneumothorax. Median sternotomy wires. No acute osseous pathology. IMPRESSION: Mild cardiomegaly with mild vascular congestion and small pleural effusion. Electronically Signed   By: Anner Crete M.D.   On: 06/04/2019 21:31   Vas US Carotid  Result Date: 06/12/2019 Carotid Arterial Duplex Study Indications:   CVA and Carotid artery disease. Risk Factors:  Hypertension, Diabetes. Other Factors: CTA neck right ICA 60% left ICA 80% stenosis. Limitations    Today's exam was limited due to the patient's inability or                unwillingness to cooperate. Performing Technologist: June Leap RDMS, RVT  Examination Guidelines: A complete evaluation includes B-mode imaging, spectral Doppler, color Doppler, and power Doppler as needed of all accessible portions of each vessel. Bilateral testing is considered an integral part of a complete examination. Limited examinations for reoccurring indications may be performed as noted.  Right Carotid Findings: +----------+-------+-------+--------+---------------------------------+--------+           PSV    EDV    StenosisPlaque Description               Comments           cm/s   cm/s                                                     +----------+-------+-------+--------+---------------------------------+--------+ CCA Prox  76     12                                                        +----------+-------+-------+--------+---------------------------------+--------+ CCA Distal84     6              heterogenous                              +----------+-------+-------+--------+---------------------------------+--------+ ICA Prox  406    70     60-79%  heterogenous, calcific and  irregular                                 +----------+-------+-------+--------+---------------------------------+--------+ ICA Distal75     11                                                       +----------+-------+-------+--------+---------------------------------+--------+ ECA       423    26     >50%    heterogenous, calcific and                                                irregular                                 +----------+-------+-------+--------+---------------------------------+--------+ +---------+--------+--------+------+ VertebralPSV cm/sEDV cm/sAbsent +---------+--------+--------+------+  Left Carotid Findings: +----------+-------+-------+--------+---------------------------------+--------+           PSV    EDV    StenosisPlaque Description               Comments           cm/s   cm/s                                                     +----------+-------+-------+--------+---------------------------------+--------+ CCA Prox  90     16                                                       +----------+-------+-------+--------+---------------------------------+--------+ CCA Distal103    18                                                       +----------+-------+-------+--------+---------------------------------+--------+ ICA Prox  271    60     60-79%  heterogenous, calcific and                                                irregular                                 +----------+-------+-------+--------+---------------------------------+--------+ ICA Distal136    19                                                        +----------+-------+-------+--------+---------------------------------+--------+ ECA  170    5                                                        +----------+-------+-------+--------+---------------------------------+--------+ +---------+--------+--------+------+ VertebralPSV cm/sEDV cm/sAbsent +---------+--------+--------+------+  Summary: Right Carotid: Velocities in the right ICA are consistent with a 60-79%                stenosis. The ECA appears >50% stenosed. Left Carotid: Velocities in the left ICA are consistent with a 60-79% stenosis. Vertebrals: Bilateral vertebral arteries were not visualized. *See table(s) above for measurements and observations.  Electronically signed by Antony Contras MD on 06/12/2019 at 12:39:16 PM.    Final     Labs:  Basic Metabolic Panel: Recent Labs  Lab 06/24/19 0711  NA 138  K 5.1  CL 107  CO2 21*  GLUCOSE 112*  BUN 47*  CREATININE 2.01*  CALCIUM 9.4    CBC: Recent Labs  Lab 06/24/19 0711  WBC 8.5  NEUTROABS 5.7  HGB 10.6*  HCT 31.6*  MCV 88.3  PLT 363    CBG: Recent Labs  Lab 06/27/19 0613 06/27/19 1154 06/27/19 1629 06/27/19 2119 06/27/19 2143  GLUCAP 78 80 214* 69* 78   Family history.  Mother with hypertension.  Father with hyperlipidemia.  Denies any colon cancer or diabetes mellitus  Brief HPI:   [name] is a 83 y.o. right-handed male with history of CAD with CABG maintained on aspirin, hard of hearing, CKD stage III, diabetes mellitus, hypertension, asthma.  History obtained from wife.  Presented 06/10/2019 with recent fall 10 days prior without loss of consciousness.  Family notes decreased balance as well as bouts of nausea.  Urine drug screen positive benzos, sodium 132, creatinine 1.98, lactic acid within normal limits alcohol level negative, COVID negative.  Cranial CT scan negative for any acute intracranial abnormalities.  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  CT angiogram head and neck occluded right vertebral artery.  80% proximal left ICA stenosis.  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  Carotid Dopplers showed 67.9 right ICA and 67.9 left ICA stenosis.  Echocardiogram with ejection fraction of 65%.  Hospital course complicated by altered mental status.  Neurology consulted placed on aspirin 81 mg daily plus Plavix 75 mg daily x3 months then Plavix alone.  Subcutaneous Lovenox for DVT prophylaxis.  Plans to follow-up outpatient with vascular surgery for carotid stenosis.  Tolerating a regular diet.  Therapy evaluations completed and patient was admitted for a comprehensive rehab program   Hospital Course: [name] was admitted to rehab 06/17/2019 for inpatient therapies to consist of PT, ST and OT at least three hours five days a week. Past admission physiatrist, therapy team and rehab RN have worked together to provide customized collaborative inpatient rehab.  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  Subcutaneous Lovenox for DVT prophylaxis no bleeding episodes.  Noted during work-up bilateral carotid stenosis follow-up outpatient vascular surgery.  Blood sugars controlled hemoglobin A1c 5.8 low-dose Glucotrol with diabetic teaching.  Blood pressure is managed with Norvasc as well as Tenormin and patient would follow-up with primary MD.  Lipitor ongoing for hyperlipidemia.  CKD stage III creatinine 2.01 patient reportedly had  been on lisinopril prior to admission remained on hold.  History of CAD with CABG no chest pain or shortness of breath.  History of gout allopurinol as directed.  Acute on chronic anemia 10.6 no bleeding episodes   Blood pressures were monitored on TID basis and monitored  Diabetes has been monitored with ac/hs CBG checks and SSI was use prn for tighter  BS control.   He has occasional incontinence of bowel and bladder with routine toileting.  He/ has made gains during rehab stay and is attending therapies  He/ will continue to receive follow up therapies   after discharge  Rehab course: During patient's stay in rehab weekly team conferences were held to monitor patient's progress, set goals and discuss barriers to discharge. At admission, patient required max assist for ambulation, moderate assist sit to stand, max assist stand pivot transfers, moderate assist supine to sit and sit supine.  Max assist upper body bathing total assist lower body bathing max assist upper body dressing total assist lower body dressing  Physical exam.  Blood pressure 175/72 pulse 80 temperature 99.6 respirations 17 oxygen saturation 99% room air Constitutional.  Well-developed HEENT normocephalic and atraumatic Eyes.  Pupils reactive to light no discharge without nystagmus Neck.  Supple nontender no JVD without thyromegaly Respiratory effort normal no respiratory distress without wheeze GI.  No distention nontender positive bowel sounds without rebound Musculoskeletal.  General.  Deformity left upper extremity congenital Neurological.  Alert oriented to person only hard of hearing motor limited due to ability to follow commands moving left upper extremity freely.  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 He/ has had improvement in functional use RUE/LUE  and RLE/LLE as well as improvement in awareness.  Perform simulated car transfers with rolling walker and overall contact-guard assist and wife return demonstration.  Ramp negotiation with rolling walker and contact-guard assist.  ADL apartment teaching including recliner contact-guard assist with several attempts due to low surface regular bed supervision for bed mobility and  supervision for transfers.  Ambulate 65 feet supervision contact-guard assist.  Moderate assist with mild lean contact-guard rolling walker for ADLs.  Able to complete ADLs with supervision contact-guard assist level limited by decreased balance.  Speech therapy follow-up displayed insight into his deficits and verbally stated his awareness he should have help for his ADLs such as going to the restroom and double checking his pillbox.  Facilitated sessions with functional conversation regarding discharge with wife and patient.  Full teaching completed plan discharge to home       Disposition: Discharge disposition: 01-Home or Self Care     Discharge to home   Diet: Diabetic diet  Special Instructions: No driving smoking or alcohol  Aspirin and Plavix aspirin and Plavix x3 months then Plavix alone  Follow-up outpatient vascular surgery for evaluation of carotid stenosis  Medications at discharge. 1.  Tylenol as needed 2.  Albuterol inhaler 2 puffs every 6 hours as needed 3.  Zyloprim 300 mg p.o. nightly 4.  Norvasc 5 mg p.o. daily 5.  Aspirin 81 mg p.o. daily 6.  Tenormin 50 mg p.o. daily 7.  Lipitor 20 mg p.o. nightly 8.  Plavix 75 mg p.o. daily 9.  Pepcid 20 mg p.o. daily 10.  Glucotrol 5 mg p.o. breakfast 11.  Claritin 10 mg daily  Discharge Instructions    Ambulatory referral to Neurology  Complete by: As directed    An appointment is requested in approximately 4 weeks right thalamic right internal capsule infarction   Ambulatory referral to Physical Medicine Rehab   Complete by: As directed    Moderate complexity follow up 1-2 weeks right thalamic /internal capsule infarction   Ambulatory referral to Vascular Surgery   Complete by: As directed    Referral for bilateral carotid stenosis identified during work-up of CVA     Allergies as of 06/28/2019      Reactions   Nsaids Nausea And Vomiting      Medication List    STOP taking these medications   ALPRAZolam  0.5 MG tablet Commonly known as: XANAX   co-enzyme Q-10 30 MG capsule   lisinopril 5 MG tablet Commonly known as: ZESTRIL   Muscle Rub 10-15 % Crea   nystatin cream Commonly known as: MYCOSTATIN   ondansetron 4 MG disintegrating tablet Commonly known as: Zofran ODT   triamcinolone cream 0.1 % Commonly known as: KENALOG     TAKE these medications   acetaminophen 325 MG tablet Commonly known as: TYLENOL Take 2 tablets (650 mg total) by mouth every 4 (four) hours as needed for mild pain (or temp > 37.5 C (99.5 F)).   albuterol 108 (90 Base) MCG/ACT inhaler Commonly known as: VENTOLIN HFA Inhale 1-2 puffs into the lungs every 6 (six) hours as needed for wheezing or shortness of breath.   allopurinol 300 MG tablet Commonly known as: ZYLOPRIM Take 1 tablet (300 mg total) by mouth at bedtime.   amLODipine 5 MG tablet Commonly known as: NORVASC Take 1 tablet (5 mg total) by mouth every morning.   aspirin 81 MG EC tablet Take 1 tablet (81 mg total) by mouth daily. Take for three months.   atenolol 50 MG tablet Commonly known as: TENORMIN Take 1 tablet (50 mg total) by mouth daily. What changed:   medication strength  how much to take  when to take this   atorvastatin 40 MG tablet Commonly known as: LIPITOR Take 0.5 tablets (20 mg total) by mouth at bedtime.   cholecalciferol 25 MCG (1000 UT) tablet Commonly known as: VITAMIN D3 Take 1 tablet (1,000 Units total) by mouth every evening.   clopidogrel 75 MG tablet Commonly known as: PLAVIX Take 1 tablet (75 mg total) by mouth daily.   famotidine 20 MG tablet Commonly known as: PEPCID Take 1 tablet (20 mg total) by mouth daily.   furosemide 20 MG tablet Commonly known as: LASIX Take 20 mg by mouth every morning. *May take as needed for fluid retention   glipiZIDE 5 MG tablet Commonly known as: GLUCOTROL Take 1 tablet (5 mg total) by mouth every morning.   loratadine 10 MG tablet Commonly known as:  CLARITIN Take 10 mg by mouth daily.   Lutein 20 MG Caps Take 1 capsule by mouth every morning.   theophylline 300 MG 12 hr tablet Commonly known as: THEODUR Take 300 mg by mouth 2 (two) times daily.   vitamin B-12 250 MCG tablet Commonly known as: CYANOCOBALAMIN Take 250 mcg by mouth every evening.   vitamin C 500 MG tablet Commonly known as: ASCORBIC ACID Take 500 mg by mouth every evening.   VITAMIN E COMPLEX PO Take 1 capsule by mouth every evening.      Follow-up Information    Meredith Staggers, MD Follow up.   Specialty: Physical Medicine and Rehabilitation Why: Office to call for appointment Contact information: [address] [place] [address] [phone]        [name], MD .   Specialty: Internal Medicine Contact information: [address] [place] [address] [phone]           Signed: Lavon Paganini [place] 06/28/2019, 5:15 AM

## 2019-06-27 NOTE — Progress Notes (Addendum)
Speech Language Pathology Daily Session Note  Patient Details  Name: [name] MRN: [id number] Date of Birth: [birth year]  Today's Date: 06/27/2019 SLP Individual Time: 8250-0370 SLP Individual Time Calculation (min): 54 min  Short Term Goals: Week 2: SLP Short Term Goal 1 (Week 2): Pt will demonstrate sustained attention to task in 5 minute intervals with mod A verbal cues for redirection. SLP Short Term Goal 2 (Week 2): Pt will demonstrate verbal and functional initation of requested task in 7/10 opportunties with Mod A verbal cues. SLP Short Term Goal 3 (Week 2): Pt will identify 1 cognitive and 1 physical deficit with Mod A verbal cues. SLP Short Term Goal 4 (Week 2): Pt will demonstrate functional recall of daily information with Max A for use of compensatory memory strategies.  Skilled Therapeutic Interventions: Pt was seen for skilled ST targeting cognitive goals. SLP facilitated session with a semi-complex monthly scheduling task. Mod A verbal and visual cues were required for organization and deducing information to record it on a monthly calendar. Pt interpreted basic information (bill due dates, DR appts. etc.) and transferred it to the calendar with only Supervision A verbal cues. Increased Mod A verbal cues required for pt to interpret more complex information (travel dates spanning across time, etc.) and problem solving how to list on calendar and correct 1 error. SLP further facilitated session with functional conversation regarding progress this admission and upcoming discharge, during which pt recalled 2 safety precautions (don't get up without assistance, use walker) with Min-Mod A question cues from clinician. Pt also recalled daily therapy schedule with Mod A for use of external aid. Furthermore, he demonstrated significantly improved initiation and selective attention during tasks for ~30 minutes with Supervision-Min A for redirection. Pt was left sitting in wheelchair with  seatbelt alarm engaged and all needs within reach. Continue per current plan of care.      Pain Pain Assessment Pain Scale: 0-10 Pain Score: 0-No pain   Therapy/Group: Individual Therapy  Arbutus Leas 06/27/2019, 9:31 AM

## 2019-06-27 NOTE — Progress Notes (Signed)
Speech Language Pathology Daily Session Note  Patient Details  Name: [name] MRN: [id number] Date of Birth: [birth year]  Today's Date: 06/27/2019 SLP Individual Time: 4315-4008 SLP Individual Time Calculation (min): 25 min  Short Term Goals: Week 2: SLP Short Term Goal 1 (Week 2): Pt will demonstrate sustained attention to task in 5 minute intervals with mod A verbal cues for redirection. SLP Short Term Goal 2 (Week 2): Pt will demonstrate verbal and functional initation of requested task in 7/10 opportunties with Mod A verbal cues. SLP Short Term Goal 3 (Week 2): Pt will identify 1 cognitive and 1 physical deficit with Mod A verbal cues. SLP Short Term Goal 4 (Week 2): Pt will demonstrate functional recall of daily information with Max A for use of compensatory memory strategies.  Skilled Therapeutic Interventions: Pt was seen for skilled ST targeting family education with pt's wife, [name]. SLP facilitated session with functional conversation regarding discharge, reviewed and praised pt's progress in [place] since admission. Pt displayed insight into his deficits and verbally stated his awareness he should have help for ADLs such as going to the restroom, and double checking pill box. SLP explained nature of pt's delayed auditory processing, initiation, and attention and demonstrated effective ways to assist pt - ex: minimizing environmental distractions, repeating directions and allowing extra time for a response, etc. SLP also emphasized recommendation for pt to have 24/7 supervision and assistance upon return home, and specifically with complex cognitive tasks such as medication and finance management, remembering medical appointments, etc. Pt's wife verbally acknowledged her ability to provide 24/7 supervision/assist for cognition. SLP also provided verbal explanation and handout for compensatory memory strategies. All questions were left to pt and wife's satisfaction. Pt was left sitting in  wheelchair with seatbelt alarm engaged and wife still present. Continue per current plan of care.       Pain Pain Assessment Pain Score: 0-No pain  Therapy/Group: Individual Therapy  Arbutus Leas 06/27/2019, 3:03 PM

## 2019-06-27 NOTE — Progress Notes (Signed)
Social Work Patient ID: [name], male   DOB: [birth year], 83 y.o.   MRN: [id number]   Have reviewed team conference with pt and wife.  Both aware and agreeable with targeted d/c date of 10/17 and CGA - supervision goals overall. Reviewed f/u needs.  Continue to follow.  Nichoals Heyde, LCSW

## 2019-06-27 NOTE — Progress Notes (Signed)
Physical Therapy Session Note  Patient Details  Name: [name] MRN: [id number] Date of Birth: 09-Jan-1936  Today's Date: 06/27/2019 PT Individual Time: 1330-1435 PT Individual Time Calculation (min): 65 min   Short Term Goals: Week 1:  PT Short Term Goal 1 (Week 1): Pt will ambulate 89f with RW min A PT Short Term Goal 1 - Progress (Week 1): Progressing toward goal PT Short Term Goal 2 (Week 1): Pt will navigate 3 steps min A with 1 rail PT Short Term Goal 2 - Progress (Week 1): Met PT Short Term Goal 3 (Week 1): Pt will transfer bed<>WC stand pivot min A PT Short Term Goal 3 - Progress (Week 1): Met Week 2:  PT Short Term Goal 1 (Week 2): STG = LTG due to ELOS.  Skilled Therapeutic Interventions/Progress Updates:    Session focused on family education with pt's wife, [name] with focus on hands on demonstration. Performed simulated car transfer (small SUV and low sedan) with RW with overall CGA and wife return demonstrated. Ramp negotiation (family planning to have ramp built but may not be ready for d/c) with RW with CGA and cues for safe walker placement when descending. Educated and return demonstrated transfers in ADL apartment including recliner (CGA with several attempts due to low surface), regular bed (supervision for bed mobility) with supervision for transfer, and tub bench transfer into tub shower. Also demonstrated stepping backwards over shower ledge with RW - wife reports there may not be enough room to perform, so also did tub shower unit. Functional gait in apartment and on unit x 65' with supervision to CGA overall. Decreased step length as fatigued and decreased stance time on LLE due to pain/knee issues.   Therapy Documentation Precautions:  Precautions Precautions: Fall Precaution Comments: HOH, delayed initiation for tasks Restrictions Weight Bearing Restrictions: No Pain: Pain Assessment Pain Score: 0-No pain    Therapy/Group: Individual  Therapy  GCanary BrimBIvory Broad PT, DPT, CBIS  06/27/2019, 3:11 PM

## 2019-06-27 NOTE — Progress Notes (Signed)
[place] PHYSICAL MEDICINE & REHABILITATION PROGRESS NOTE  Subjective/Complaints: No new issues. Up in chair, asleep when I arrived  ROS: Limited due to cognitive/behavioral    Objective: Vital Signs: Blood pressure (!) 152/59, pulse 63, temperature 98.6 F (37 C), temperature source Oral, resp. rate 16, height 5\' 10"  (1.778 m), weight 82.7 kg, SpO2 99 %. No results found. No results for input(s): WBC, HGB, HCT, PLT in the last 72 hours. No results for input(s): NA, K, CL, CO2, GLUCOSE, BUN, CREATININE, CALCIUM in the last 72 hours.  Physical Exam: BP (!) 152/59 (BP Location: Right Arm)   Pulse 63   Temp 98.6 F (37 C) (Oral)   Resp 16   Ht 5\' 10"  (1.778 m)   Wt 82.7 kg   SpO2 99%   BMI 26.16 kg/m  Constitutional: No distress . Vital signs reviewed. HEENT: EOMI, oral membranes moist Neck: supple Cardiovascular: RRR without murmur. No JVD    Respiratory: CTA Bilaterally without wheezes or rales. Normal effort    GI: BS +, non-tender, non-distended  Skin: Warm and dry.  Intact. Psych: Flat. 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 Neurological: Arousable. Delayed processing HOH Motor: Right upper extremity: grossly 5/5 proximal distal Left upper extremity: Limited due to deformity, flexion deformity t elbow and finger flexors, ext deformity of wrist --no change Bilateral lower extremities: Hip flexion, knee extension 3 +-4-/5, ankle dorsiflexion 5/5, left weaker than right.--stable. Decreased light touch LUE and LLE is ongoing.    Assessment/Plan: 1. Functional deficits secondary to right thalamic/internal capsule infarcts which require 3+ hours per day of interdisciplinary therapy in a comprehensive inpatient rehab setting.  Physiatrist is providing close team supervision and 24 hour management of active medical problems listed below.  Physiatrist and rehab team continue to assess barriers to discharge/monitor patient progress toward  functional and medical goals  Care Tool:  Bathing    Body parts bathed by patient: Left arm, Chest, Abdomen, Right upper leg, Face, Front perineal area, Buttocks, Left upper leg   Body parts bathed by helper: Right arm Body parts n/a: Right lower leg, Left lower leg   Bathing assist Assist Level: Minimal Assistance - Patient > 75%     Upper Body Dressing/Undressing Upper body dressing   What is the patient wearing?: Pull over shirt    Upper body assist Assist Level: Supervision/Verbal cueing    Lower Body Dressing/Undressing Lower body dressing      What is the patient wearing?: Pants     Lower body assist Assist for lower body dressing: Minimal Assistance - Patient > 75%     Toileting Toileting    Toileting assist Assist for toileting: Contact Guard/Touching assist     Transfers Chair/bed transfer  Transfers assist     Chair/bed transfer assist level: Supervision/Verbal cueing Chair/bed transfer assistive device: Programmer, multimedia   Ambulation assist      Assist level: Contact Guard/Touching assist Assistive device: Walker-rolling Max distance: 50 ft   Walk 10 feet activity   Assist     Assist level: Contact Guard/Touching assist Assistive device: Walker-rolling   Walk 50 feet activity   Assist Walk 50 feet with 2 turns activity did not occur: Safety/medical concerns(Fatigue and weakness)  Assist level: Contact Guard/Touching assist Assistive device: Walker-rolling    Walk 150 feet activity   Assist Walk 150 feet activity did not occur: Safety/medical concerns         Walk 10 feet on uneven  surface  activity   Assist Walk 10 feet on uneven surfaces activity did not occur: Safety/medical concerns         Wheelchair     Assist Will patient use wheelchair at discharge?: No             Wheelchair 50 feet with 2 turns activity    Assist            Wheelchair 150 feet activity     Assist             Medical Problem List and Plan: 1.  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   Continue CIR PT, OT, SLP    ELOS 10/17 2.  Antithrombotics: -DVT/anticoagulation: Lovenox             -antiplatelet therapy: Aspirin 81 mg daily, Plavix 75 mg daily x3 months then Plavix alone 3. Pain Management: Tylenol as needed  -continue low dose gabapentin for ?neuropathic pain LLE--observe for efficacy 4. Mood: Provide emotional support             -antipsychotic agents: N/A 5. Neuropsych: This patient is not capable of making decisions on his own behalf. 6. Skin/Wound Care: Routine skin checks 7. Fluids/Electrolytes/Nutrition: Routine in and outs 8.  Bilateral carotid stenosis.  Follow-up vascular surgery as outpatient. 9.  Diabetes mellitus with hyperglycemia.  Hemoglobin A1c 5.8.    Continue Glucotrol 5 mg daily.  Improved control on 10/15    10. Essential hypertension.  Patient on Norvasc, Tenormin 100 mg daily and lisinopril 5 mg daily prior to admission.  Resume as needed Vitals:   06/26/19 1937 06/27/19 0527  BP: (!) 145/57 (!) 152/59  Pulse: 65 63  Resp: 18 16  Temp: (!) 97.4 F (36.3 C) 98.6 F (37 C)  SpO2: 99% 99%  fair control at present  Continue Norvasc 5mg   Tenormin 50 daily  11.  CKD stage III.    Creatinine 1.81 on 10/6, Cr 2.0  10/12  Continue to monitor 12.  Hyperlipidemia.  Lipitor 13.  CAD with CABG 1997.  No chest pain or shortness of breath.  Continue aspirin and Plavix 14.  History of gout.  Zyloprim 300 mg daily.  Monitor for gout flareups 15.  Hard of hearing: Ensure patient wearing hearing aids 16.  History of asthma.  Monitor for increased shortness of breath.  Patient on albuterol inhaler as needed as well as theophylline 300 mg twice daily prior to admission. 17.  Hypoalbuminemia  Supplement  initiated on 10/6 18.  Acute blood loss anemia  Hemoglobin 10.6 on 10/8 and again on 10/12  Continue to monitor 19.  Congential L brachial plexopathy with chronic contractures--at baseline  LOS: 10 days A FACE TO FACE EVALUATION WAS PERFORMED  Meredith Staggers 06/27/2019, 9:02 AM

## 2019-06-27 NOTE — Progress Notes (Signed)
Occupational Therapy Session Note  Patient Details  Name: [name] MRN: [id number] Date of Birth: 02/02/1936  Today's Date: 06/27/2019 OT Individual Time: 0930-1030 OT Individual Time Calculation (min): 60 min    Short Term Goals: Week 2:  OT Short Term Goal 1 (Week 2): stg=ltg d/t elos      Skilled Therapeutic Interventions/Progress Updates:    Pt received in w/c, no c/o pain and reports needing to use the bathroom. Pt completes functional mobility with RW and CGA to toilet; reports needing to void BM if possible. Pt voided only urine and completes toileting with CGA. Pt completes functoinal mobility to shower with CGA and completes UB bathing with  (S) and LB bathing with with CGA while standing to wash buttocks. Pt washing hair in standing with CGA. Pt completes UB dressing using hemi technique with (S) and LB dressing with min A to get feet into pants. Pt is total A for donning TEDS and (S) for donning shoes with use of shoehorn. Completed shaving and oral care seated in w/c at sink level with (S). Throughout session, pt requiring min-mod VC for maintaining close proximity to RW and using RW until transfer is complete. End of session pt left in w/c with safety belt on and RN in room.   Therapy Documentation Precautions:  Precautions Precautions: Fall Precaution Comments: HOH, delayed initiation for tasks Restrictions Weight Bearing Restrictions: No      Therapy/Group: Individual Therapy  Karne Ozga 06/27/2019, 12:07 PM

## 2019-06-28 ENCOUNTER — Inpatient Hospital Stay (HOSPITAL_COMMUNITY): Payer: Medicare Other | Admitting: Speech Pathology

## 2019-06-28 ENCOUNTER — Inpatient Hospital Stay (HOSPITAL_COMMUNITY): Payer: Medicare Other

## 2019-06-28 ENCOUNTER — Inpatient Hospital Stay (HOSPITAL_COMMUNITY): Payer: Medicare Other | Admitting: Physical Therapy

## 2019-06-28 LAB — GLUCOSE, CAPILLARY
Glucose-Capillary: 108 mg/dL — ABNORMAL HIGH (ref 70–99)
Glucose-Capillary: 91 mg/dL (ref 70–99)
Glucose-Capillary: 161 mg/dL — ABNORMAL HIGH (ref 70–99)
Glucose-Capillary: 87 mg/dL (ref 70–99)

## 2019-06-28 NOTE — Progress Notes (Signed)
Speech Language Pathology Discharge Summary  Patient Details  Name: [name] MRN: [id number] Date of Birth: 12/19/1935  Today's Date: 06/28/2019 SLP Individual Time: 0725-0820 SLP Individual Time Calculation (min): 55 min   Skilled Therapeutic Interventions:  Skilled treatment session focused on cognitive goals. SLP facilitated session by providing supervision level verbal cues for basic problem solving with tray set-up. Patient with appropriate and timely verbal responses during basic conversation but demonstrated mild disorientation in regards to date and reporting he was discharging home today instead of tomorrow. SLP also facilitated session by administering the [facility] in which he scored 19/30 points with a score of 26 or above considered normal. Patient demonstrated deficits in attention, short-term recall and executive functioning. Patient left upright in bed with alarm on and all needs within reach. Continue with current plan of care.    Patient has met 5 of 5 long term goals.  Patient to discharge at [facility] level.   Reasons goals not met: N/A   Clinical Impression/Discharge Summary: Patient has made functional gains and has met 5 of 5 LTGs this admission. Currently, patient requires overall Min A verbal and visual cues to complete functional and familiar tasks safely in regards to basic problem solving, sustained attention, intellectual awareness and recall of daily information with use of strategies. Patient also demonstrates improved initial of verbal expression but continues to demonstrate delayed processing requiring extra time to complete tasks. Patient and family education is complete and patient will discharge home with 24 hour supervision from family. Patient would benefit from f/u SLP services to maximize his cognitive functioning and overall functional independence in order to reduce caregiver burden.   Care Partner:  Caregiver Able to Provide Assistance: Yes   Type of Caregiver Assistance: Physical;Cognitive  Recommendation:  24 hour supervision/assistance;Home Health SLP  Rationale for SLP Follow Up: Maximize cognitive function and independence;Reduce caregiver burden   Equipment: N/A   Reasons for discharge: Discharged from hospital;Treatment goals met   Patient/Family Agrees with Progress Made and Goals Achieved: Yes    [name] 06/28/2019, 6:29 AM

## 2019-06-28 NOTE — Progress Notes (Signed)
[place] PHYSICAL MEDICINE & REHABILITATION PROGRESS NOTE  Subjective/Complaints: Up in bed. No new complaints. Happy to be going home tomorrow  ROS: Patient denies fever, rash, sore throat, blurred vision, nausea, vomiting, diarrhea, cough, shortness of breath or chest pain, joint or back pain, headache, or mood change.   Objective: Vital Signs: Blood pressure (!) 168/64, pulse 64, temperature 98.7 F (37.1 C), temperature source Oral, resp. rate 15, height 5\' 10"  (1.778 m), weight 82.7 kg, SpO2 98 %. No results found. No results for input(s): WBC, HGB, HCT, PLT in the last 72 hours. No results for input(s): NA, K, CL, CO2, GLUCOSE, BUN, CREATININE, CALCIUM in the last 72 hours.  Physical Exam: BP (!) 168/64   Pulse 64   Temp 98.7 F (37.1 C) (Oral)   Resp 15   Ht 5\' 10"  (1.778 m)   Wt 82.7 kg   SpO2 98%   BMI 26.16 kg/m  Constitutional: No distress . Vital signs reviewed. HEENT: EOMI, oral membranes moist Neck: supple Cardiovascular: RRR without murmur. No JVD    Respiratory: CTA Bilaterally without wheezes or rales. Normal effort    GI: BS +, non-tender, non-distended  Skin: Warm and dry.  Intact. 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 Neurological: alert, follows simple commands and fair awareness HOH Motor: Right upper extremity: grossly 5/5 proximal distal Left upper extremity: Limited due to deformity, flexion deformity t elbow and finger flexors, ext deformity of wrist --no change Bilateral lower extremities: Hip flexion, knee extension 3 +-4-/5, ankle dorsiflexion 5/5, left weaker than right.--stable. Decreased light touch LUE and LLE is ongoing---stable.  Assessment/Plan: 1. Functional deficits secondary to right thalamic/internal capsule infarcts which require 3+ hours per day of interdisciplinary therapy in a comprehensive inpatient rehab setting.  Physiatrist is providing close team supervision and 24  hour management of active medical problems listed below.  Physiatrist and rehab team continue to assess barriers to discharge/monitor patient progress toward functional and medical goals  Care Tool:  Bathing    Body parts bathed by patient: Right arm, Left arm, Chest, Abdomen, Front perineal area, Buttocks, Right upper leg, Left upper leg, Right lower leg, Left lower leg, Face   Body parts bathed by helper: Right arm Body parts n/a: Right lower leg, Left lower leg   Bathing assist Assist Level: Contact Guard/Touching assist     Upper Body Dressing/Undressing Upper body dressing   What is the patient wearing?: Pull over shirt, Button up shirt(Did not button up shirt)    Upper body assist Assist Level: Supervision/Verbal cueing    Lower Body Dressing/Undressing Lower body dressing      What is the patient wearing?: Pants     Lower body assist Assist for lower body dressing: Minimal Assistance - Patient > 75%     Toileting Toileting    Toileting assist Assist for toileting: Contact Guard/Touching assist     Transfers Chair/bed transfer  Transfers assist     Chair/bed transfer assist level: Supervision/Verbal cueing Chair/bed transfer assistive device: Armrests, Programmer, multimedia   Ambulation assist      Assist level: Contact Guard/Touching assist Assistive device: Walker-rolling Max distance: 65'   Walk 10 feet activity   Assist     Assist level: Supervision/Verbal cueing Assistive device: Walker-rolling, Other (comment)(L knee brace)   Walk 50 feet activity   Assist Walk 50 feet with 2 turns activity did not occur: Safety/medical concerns(Fatigue and weakness)  Assist level: Contact Guard/Touching assist  Assistive device: Walker-rolling, Other (comment)(L knee brace)    Walk 150 feet activity   Assist Walk 150 feet activity did not occur: Safety/medical concerns         Walk 10 feet on uneven surface   activity   Assist Walk 10 feet on uneven surfaces activity did not occur: Safety/medical concerns   Assist level: Contact Guard/Touching assist Assistive device: Aeronautical engineer Will patient use wheelchair at discharge?: No             Wheelchair 50 feet with 2 turns activity    Assist            Wheelchair 150 feet activity     Assist            Medical Problem List and Plan: 1.  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   Continue CIR PT, OT, SLP    ELOS 10/17  -Patient to see Rehab MD/provider in the office for transitional care encounter in 1-2 weeks.  2.  Antithrombotics: -DVT/anticoagulation: Lovenox             -antiplatelet therapy: Aspirin 81 mg daily, Plavix 75 mg daily x3 months then Plavix alone 3. Pain Management: Tylenol as needed  -continue low dose gabapentin for neuropathic pain LLE--seems to have helped---would continue at discharge and discuss at outpt f/u 4. Mood: Provide emotional support             -antipsychotic agents: N/A 5. Neuropsych: This patient is not capable of making decisions on his own behalf. 6. Skin/Wound Care: Routine skin checks 7. Fluids/Electrolytes/Nutrition: Routine in and outs 8.  Bilateral carotid stenosis.  Follow-up vascular surgery as outpatient. 9.  Diabetes mellitus with hyperglycemia.  Hemoglobin A1c 5.8.    Continue Glucotrol 5 mg daily.  Improved control on 10/16    10. Essential hypertension.  Patient on Norvasc, Tenormin 100 mg daily and lisinopril 5 mg daily prior to admission.  Resume as needed Vitals:   06/28/19 0554 06/28/19 0825  BP: (!) 172/64 (!) 168/64  Pulse: 66 64  Resp: 15   Temp: 98.7 F (37.1 C)   SpO2: 98%   fair to borderline control at present---will not make any changes prior to  discharge  Continue Norvasc 5mg   Tenormin 50 daily  11.  CKD stage III.    Creatinine 1.81 on 10/6, Cr 2.0  10/12  Continue to monitor 12.  Hyperlipidemia.  Lipitor 13.  CAD with CABG 1997.  No chest pain or shortness of breath.  Continue aspirin and Plavix 14.  History of gout.  Zyloprim 300 mg daily.  Monitor for gout flareups 15.  Hard of hearing: Ensure patient wearing hearing aids 16.  History of asthma.  Monitor for increased shortness of breath.  Patient on albuterol inhaler as needed as well as theophylline 300 mg twice daily prior to admission.  -breathing normally 17.  Hypoalbuminemia  Supplement initiated on 10/6 18.  Acute blood loss anemia  Hemoglobin 10.6 on 10/8 and again on 10/12  Continue to monitor 19.  Congential L brachial plexopathy with chronic contractures--at baseline  LOS: 11 days A FACE TO FACE EVALUATION WAS PERFORMED  Meredith Staggers 06/28/2019, 9:05 AM

## 2019-06-28 NOTE — Progress Notes (Signed)
Occupational Therapy Discharge Summary  Patient Details  Name: [name] MRN: [id number] Date of Birth: [birth year]  Today's Date: 06/28/2019 OT Individual Time: 5465-6812 OT Individual Time Calculation (min): 72 min    Patient has met 13 of 13 long term goals due to improved activity tolerance, improved balance, postural control, ability to compensate for deficits, functional use of  RIGHT upper, RIGHT lower, LEFT upper and LEFT lower extremity, improved attention, improved awareness and improved coordination.  Patient to discharge at overall Supervision level.  Patient's care partner is independent but elderly, pt is (S) and wife is able to provide the necessary physical and cognitive assistance at discharge.    Reasons goals not met: N/A  Recommendation:  Patient will benefit from ongoing skilled OT services in home health setting to continue to advance functional skills in the area of BADL and iADL.  Equipment: BSC  Reasons for discharge: treatment goals met  Patient/family agrees with progress made and goals achieved: Yes  Skilled OT Intervention   Pt received in w/c, agreeable to tx this date. Pt completes UB and LB bathing at sink level. Completes UB bathing seated at w/c with (S) and LB bathing in standing with (S). Pt completed UB and LB dressing with (S). Pt reports needing to use the bathroom; completed funcional mobility to bathroom with RW and (S). Pt voided BM with increased time and was (S) for toileting. Pt is total A for donning TEDS and (S) for socks and shoes with use of shoehorn. Pt transported to therapy gym; completes [place] cognition task in standing w/o RW for support to address increased sustained attention to task in standing and dynamic standing balance. Pt completes functional mobility through 4 cones with (S) to increase safe use of RW in preparation for d/c to home environment. Pt transported to therapy apartment to practice walk in shower transfer and  sit <> stand from couch in preparation for d/c home. Pt completes both activities with (S) and one time need for CGA for balance. Exited session pt in w/c, safety belt on and all needs met.   OT Discharge Precautions/Restrictions  Precautions Precautions: Fall Restrictions Weight Bearing Restrictions: No General   Vital Signs Therapy Vitals Temp: 98.6 F (37 C) Temp Source: Oral Pulse Rate: (!) 55 Resp: 18 BP: (!) 142/55 Patient Position (if appropriate): Sitting Oxygen Therapy SpO2: 100 % O2 Device: Room Air   ADL ADL Eating: Supervision/safety Grooming: Supervision/safety Where Assessed-Grooming: Sitting at sink Upper Body Bathing: Supervision/safety Where Assessed-Upper Body Bathing: Standing at sink Lower Body Bathing: Supervision/safety Where Assessed-Lower Body Bathing: Sitting at sink Upper Body Dressing: Supervision/safety Where Assessed-Upper Body Dressing: Sitting at sink Lower Body Dressing: Supervision/safety Where Assessed-Lower Body Dressing: Standing at sink Toileting: Supervision/safety Where Assessed-Toileting: Glass blower/designer: Close supervision Armed forces technical officer Method: Human resources officer: Close supervison Clinical cytogeneticist Method: Optometrist: Civil engineer, contracting with back Social research officer, government: Close supervision Social research officer, government Method: Heritage manager: Civil engineer, contracting with back Vision Baseline Vision/History: Wears glasses Wears Glasses: At all times Patient Visual Report: No change from baseline Vision Assessment?: No apparent visual deficits Perception  Perception: Within Functional Limits Praxis Praxis: Intact Praxis Impairment Details: Initiation Cognition Overall Cognitive Status: Within Functional Limits for tasks assessed Arousal/Alertness: Awake/alert Orientation Level: Oriented X4 Attention: Focused;Sustained;Selective Focused Attention: Appears intact Sustained  Attention: Appears intact Selective Attention: Appears intact Memory: Impaired Memory Impairment: Retrieval deficit;Decreased recall of new information Sensation Sensation Light Touch: Appears Intact Hot/Cold: Appears Intact Proprioception:  Appears Intact Stereognosis: Appears Intact Coordination Gross Motor Movements are Fluid and Coordinated: No Fine Motor Movements are Fluid and Coordinated: Yes Motor  Motor Motor: Abnormal postural alignment and control Mobility  Transfers Sit to Stand: Supervision/Verbal cueing Stand to Sit: Supervision/Verbal cueing  Trunk/Postural Assessment  Cervical Assessment Cervical Assessment: Within Functional Limits Thoracic Assessment Thoracic Assessment: Exceptions to WFL(rounded shoulders) Lumbar Assessment Lumbar Assessment: Within Functional Limits Postural Control Postural Control: Deficits on evaluation Postural Limitations: mild posterior lean  Balance Balance Balance Assessed: Yes Static Sitting Balance Static Sitting - Level of Assistance: 5: Stand by assistance Dynamic Sitting Balance Dynamic Sitting - Level of Assistance: 5: Stand by assistance Static Standing Balance Static Standing - Level of Assistance: 5: Stand by assistance Dynamic Standing Balance Dynamic Standing - Level of Assistance: 5: Stand by assistance Extremity/Trunk Assessment RUE Assessment RUE Assessment: Within Functional Limits LUE Assessment LUE Assessment: Exceptions to [facility] Strength Comments: Pt has a lifelong congenital condition.  hand and wrist in flexed position.     06/28/2019, 12:30 PM

## 2019-06-28 NOTE — Progress Notes (Signed)
Physical Therapy Discharge Summary  Patient Details  Name: [name] MRN: [id number] Date of Birth: 1935-10-29  Today's Date: 06/28/2019 PT Individual Time: 5456-2563 PT Individual Time Calculation (min): 59 min    Patient has met 9 of 9 long term goals due to improved activity tolerance, improved balance, improved postural control, increased strength, ability to compensate for deficits, improved attention and improved awareness.  Patient to discharge at an ambulatory level CGA.   Patient's care partner attended family education/training and is independent to provide the necessary physical and cognitive assistance at discharge.  All goals met.  Recommendation:  Patient will benefit from ongoing skilled PT services in home health setting to continue to advance safe functional mobility, address ongoing impairments in standing balance, gait, stair navigation, activity tolerance, strength, and minimize fall risk.  Equipment: RW and Transport chair  Reasons for discharge: treatment goals met and discharge from hospital  Patient/family agrees with progress made and goals achieved: Yes  Skilled Therapeutic Interventions/Progress Updates:  Patient received sitting in w/c with his wife present and pt agreeable to therapy session. Pt's wife reports she feels much better about assisting pt at home after participating in family education yesterday. Pt's wife reports that they are finishing building the ramp for home entry and it will be complete by tomorrow. Pt performed sit<>stands using RW with close supervision for safety throughout session. Pt requesting to use bathroom. Ambulated ~31f x2 in/out of bathroom using RW with CGA for steadying. Performed LB clothing management with CGA for steadying while standing. Pt continent of urine and performed anterior peri-care with supervision for balance safety. Standing hand hygiene at sink with CGA for steadying and max cuing for proper AD management  at sink. Supine<>sit, HOB flat and not using bedrails, with supervision. Stand pivot EOB>w/c using RW with CGA for steadying.  Transported to/from gym in w/c. Simulated car transfer (sedan height) using RW with CGA for steadying - pt able to recall correct sequencing of transfer without cuing. Ambulated ~139fx2 up/down ramp using RW with CGA for steadying. Ambulated 1173fsing RW with CGA for steadying - mod cuing for increased upright trunk posture - pt ambulates at decreased gait speed. Ascended/descended 8 steps using R UE support on each handrail going up/down with step-to pattern both directions and pt demonstrating proper sequencing with min cuing and pt's wife able to verbalize her correct placement to assist pt if needed. Transported back to room in w/c and pt left sitting upright with needs in reach, seat belt alarm on, and pt's wife present.  PT Discharge Precautions/Restrictions Precautions Precautions: Fall Precaution Comments: HOH, delayed initiation for tasks Restrictions Weight Bearing Restrictions: No Pain Pain Assessment Pain Scale: 0-10 Pain Score: 0-No pain Perception  Perception Perception: Within Functional Limits Praxis Praxis: Impaired Praxis Impairment Details: Initiation  Cognition Arousal/Alertness: Awake/alert Orientation Level: Oriented X4 Attention: Focused;Sustained Focused Attention: Appears intact Sustained Attention: Appears intact Safety/Judgment: Impaired Comments: pt stands from toilet despite therapist requesting him to ask for assistance prior to standing Sensation Sensation Light Touch: Appears Intact Hot/Cold: Not tested Proprioception: Appears Intact Stereognosis: Not tested Coordination Gross Motor Movements are Fluid and Coordinated: No Coordination and Movement Description: Poor postural control, decreased motor coordination Heel Shin Test: decreased on LLE due to knee pain Motor  Motor Motor: Abnormal postural alignment and  control Motor - Discharge Observations: general weakness  Mobility Bed Mobility Bed Mobility: Supine to Sit;Sit to Supine Sit to Supine: Supervision/Verbal cueing Transfers Transfers: Sit to Stand;Stand to Sit;Stand  Pivot Transfers Sit to Stand: Supervision/Verbal cueing Stand to Sit: Supervision/Verbal cueing Stand Pivot Transfers: Contact Guard/Touching assist Transfer (Assistive device): Rolling walker Locomotion  Gait Ambulation: Yes Gait Assistance: Contact Guard/Touching assist Gait Distance (Feet): 117 Feet Assistive device: Rolling walker Gait Assistance Details: Tactile cues for posture;Verbal cues for safe use of DME/AE Gait Gait: Yes Gait Pattern: Impaired Gait Pattern: Decreased stance time - left;Decreased stride length;Decreased weight shift to left;Decreased step length - right;Decreased step length - left;Antalgic;Poor foot clearance - right;Poor foot clearance - left Gait velocity: Decreased Stairs / Additional Locomotion Stairs: Yes Stairs Assistance: Contact Guard/Touching assist Stair Management Technique: Two rails Number of Stairs: 8 Height of Stairs: 6 Ramp: Contact Guard/touching assist Wheelchair Mobility Wheelchair Mobility: No  Trunk/Postural Assessment  Cervical Assessment Cervical Assessment: Exceptions to WFL(forward head) Thoracic Assessment Thoracic Assessment: Exceptions to WFL(thoracic kyphosis) Lumbar Assessment Lumbar Assessment: Exceptions to WFL(posterior pelvic tilt in sitting) Postural Control Postural Control: Deficits on evaluation Postural Limitations: decreased with pt using B UE support on RW during standing  Balance Balance Balance Assessed: Yes Static Sitting Balance Static Sitting - Level of Assistance: 5: Stand by assistance Dynamic Sitting Balance Dynamic Sitting - Level of Assistance: 5: Stand by assistance Static Standing Balance Static Standing - Balance Support: Bilateral upper extremity supported Static  Standing - Level of Assistance: 5: Stand by assistance Dynamic Standing Balance Dynamic Standing - Balance Support: Bilateral upper extremity supported Dynamic Standing - Level of Assistance: Other (comment)(CGA) Extremity Assessment  RLE Assessment RLE Assessment: Exceptions to [facility] General Strength Comments: grossly generalized 4/5 as observed during functional mobility LLE Assessment LLE Assessment: Exceptions to [facility] General Strength Comments: grossly generalized 4/5 as observed during functional mobility    Tawana Scale, PT, DPT 06/28/2019, 2:43 PM

## 2019-06-28 NOTE — Progress Notes (Signed)
Social Work Discharge Note   The overall goal for the admission was met for:   Discharge location: Yes - home with wife who can provide 24/7 assistance  Length of Stay: Yes - 12 days (with discharge on 10/17)  Discharge activity level: Yes - min assist overall  Home/community participation: Yes  Services provided included: MD, RD, PT, OT, SLP, RN, Pharmacy and [place]: [facility] Medicare  Follow-up services arranged: Home Health: PT, OT, ST via [place], DME: transport wheelcair via [place] and Patient/Family has no preference for HH/DME agencies  Comments (or additional information):    Best contact person:  Wife, Francisca Harbuck @ [phone] or (H) [phone]  Patient/Family verbalized understanding of follow-up arrangements: Yes  Individual responsible for coordination of the follow-up plan: pt/wife  Confirmed correct DME delivered: Lennart Pall 06/28/2019    Rinnah Peppel

## 2019-06-28 NOTE — Discharge Instructions (Signed)
Inpatient Rehab Discharge Instructions  [place] Discharge date and time: No discharge date for patient encounter.   Activities/Precautions/ Functional Status: Activity: activity as tolerated Diet: diabetic diet Wound Care: none needed Functional status:  ___ No restrictions     ___ Walk up steps independently ___ 24/7 supervision/assistance   ___ Walk up steps with assistance ___ Intermittent supervision/assistance  ___ Bathe/dress independently ___ Walk with walker     _x__ Bathe/dress with assistance ___ Walk Independently    ___ Shower independently ___ Walk with assistance    ___ Shower with assistance ___ No alcohol     ___ Return to work/school ________     COMMUNITY REFERRALS UPON DISCHARGE:    Home Health:   PT     OT     ST                     Agency:  [facility]  Phone: [phone]   Medical Equipment/Items Ordered: transport wheelchair                                                     Agency/Supplier:  [facility] @ [phone]    Special Instructions: No driving smoking or alcohol  Continue aspirin and Plavix x3 months then Plavix alone STROKE/TIA DISCHARGE INSTRUCTIONS SMOKING Cigarette smoking nearly doubles your risk of having a stroke & is the single most alterable risk factor  If you smoke or have smoked in the last 12 months, you are advised to quit smoking for your health.  Most of the excess cardiovascular risk related to smoking disappears within a year of stopping.  Ask you doctor about anti-smoking medications  [place] Quit Line: 1-800-QUIT NOW  Free Smoking Cessation Classes [phone]  CHOLESTEROL Know your levels; limit fat & cholesterol in your diet  Lipid Panel     Component Value Date/Time   CHOL 123 06/11/2019 0851   TRIG 110 06/11/2019 0851   HDL 35 (L) 06/11/2019 0851   CHOLHDL 3.5 06/11/2019 0851   VLDL 22 06/11/2019 0851   LDLCALC 66 06/11/2019 0851      Many patients benefit from treatment even if  their cholesterol is at goal.  Goal: Total Cholesterol (CHOL) less than 160  Goal:  Triglycerides (TRIG) less than 150  Goal:  HDL greater than 40  Goal:  LDL (LDLCALC) less than 100   BLOOD PRESSURE American Stroke Association blood pressure target is less that 120/80 mm/Hg  Your discharge blood pressure is:  BP: (!) 180/72  Monitor your blood pressure  Limit your salt and alcohol intake  Many individuals will require more than one medication for high blood pressure  DIABETES (A1c is a blood sugar average for last 3 months) Goal HGBA1c is under 7% (HBGA1c is blood sugar average for last 3 months)  Diabetes:     Lab Results  Component Value Date   HGBA1C 5.8 (H) 06/11/2019     Your HGBA1c can be lowered with medications, healthy diet, and exercise.  Check your blood sugar as directed by your physician  Call your physician if you experience unexplained or low blood sugars.  PHYSICAL ACTIVITY/REHABILITATION Goal is 30 minutes at least 4 days per week  Activity: Increase activity slowly, Therapies: Physical Therapy: Home Health Return to work:   Activity decreases your risk  of heart attack and stroke and makes your heart stronger.  It helps control your weight and blood pressure; helps you relax and can improve your mood.  Participate in a regular exercise program.  Talk with your doctor about the best form of exercise for you (dancing, walking, swimming, cycling).  DIET/WEIGHT Goal is to maintain a healthy weight  Your discharge diet is:  Diet Order            Diet Carb Modified Fluid consistency: Thin; Room service appropriate? Yes  Diet effective now              liquids Your height is:  Height: 5\' 10"  (177.8 cm) Your current weight is: Weight: 80.9 kg Your Body Mass Index (BMI) is:  BMI (Calculated): 25.59  Following the type of diet specifically designed for you will help prevent another stroke.  Your goal weight range is:    Your goal Body Mass Index (BMI)  is 19-24.  Healthy food habits can help reduce 3 risk factors for stroke:  High cholesterol, hypertension, and excess weight.  RESOURCES Stroke/Support Group:  Call [phone]   STROKE EDUCATION PROVIDED/REVIEWED AND GIVEN TO PATIENT Stroke warning signs and symptoms How to activate emergency medical system (call 911). Medications prescribed at discharge. Need for follow-up after discharge. Personal risk factors for stroke. Pneumonia vaccine given:  Flu vaccine given:  My questions have been answered, the writing is legible, and I understand these instructions.  I will adhere to these goals & educational materials that have been provided to me after my discharge from the hospital.      My questions have been answered and I understand these instructions. I will adhere to these goals and the provided educational materials after my discharge from the hospital.  Patient/Caregiver Signature _______________________________ Date __________  Clinician Signature _______________________________________ Date __________  Please bring this form and your medication list with you to all your follow-up doctor's appointments.

## 2019-06-29 DIAGNOSIS — N1832 Chronic kidney disease, stage 3b: Secondary | ICD-10-CM

## 2019-06-29 DIAGNOSIS — I63039 Cerebral infarction due to thrombosis of unspecified carotid artery: Secondary | ICD-10-CM

## 2019-06-29 LAB — GLUCOSE, CAPILLARY
Glucose-Capillary: 96 mg/dL (ref 70–99)
Glucose-Capillary: 127 mg/dL — ABNORMAL HIGH (ref 70–99)

## 2019-06-29 MED ORDER — BLOOD GLUCOSE MONITOR KIT: PACK | 0 refills | Status: AC

## 2019-06-29 NOTE — Progress Notes (Signed)
Patient for discharge today waiting for wife. No further questions noted at this time.

## 2019-06-29 NOTE — Progress Notes (Signed)
[place] PHYSICAL MEDICINE & REHABILITATION PROGRESS NOTE  Subjective/Complaints: Patient seen sitting up in his chair this morning.  He states he slept well overnight.  He states he is ready for discharge.  ROS: Denies CP, SOB, N/V/D  Objective: Vital Signs: Blood pressure (!) 153/54, pulse (!) 58, temperature 98.2 F (36.8 C), temperature source Oral, resp. rate 16, height 5\' 10"  (1.778 m), weight 82.7 kg, SpO2 97 %. No results found. No results for input(s): WBC, HGB, HCT, PLT in the last 72 hours. No results for input(s): NA, K, CL, CO2, GLUCOSE, BUN, CREATININE, CALCIUM in the last 72 hours.  Physical Exam: BP (!) 153/54 (BP Location: Right Arm)   Pulse (!) 58   Temp 98.2 F (36.8 C) (Oral)   Resp 16   Ht 5\' 10"  (1.778 m)   Wt 82.7 kg   SpO2 97%   BMI 26.16 kg/m  Constitutional: No distress . Vital signs reviewed. HENT: Normocephalic.  Atraumatic. Eyes: EOMI. No discharge. Cardiovascular: No JVD. Respiratory: Normal effort.  No stridor. GI: Non-distended. Skin: Warm and dry.  Intact. Psych: Normal mood.  Normal behavior. Musc: Left upper extremity congenital deformity  Neurological: Alert  Follows simple commands and fair awareness HOH Motor: Right upper extremity: grossly 5/5 proximal distal Left upper extremity: Limited due to deformity, unchanged Bilateral lower extremities: Hip flexion, knee extension 4 -/5, ankle dorsiflexion 5/5, left weaker than right.  Assessment/Plan: 1. Functional deficits secondary to right thalamic/internal capsule infarcts which require 3+ hours per day of interdisciplinary therapy in a comprehensive inpatient rehab setting.  Physiatrist is providing close team supervision and 24 hour management of active medical problems listed below.  Physiatrist and rehab team continue to assess barriers to discharge/monitor patient progress toward functional and medical goals  Care Tool:  Bathing    Body parts bathed by patient: Right arm,  Left arm, Chest, Abdomen, Front perineal area, Buttocks, Right upper leg, Left upper leg, Right lower leg, Left lower leg, Face   Body parts bathed by helper: Right arm Body parts n/a: Right lower leg, Left lower leg   Bathing assist Assist Level: Supervision/Verbal cueing     Upper Body Dressing/Undressing Upper body dressing   What is the patient wearing?: Pull over shirt    Upper body assist Assist Level: Supervision/Verbal cueing    Lower Body Dressing/Undressing Lower body dressing      What is the patient wearing?: Pants     Lower body assist Assist for lower body dressing: Supervision/Verbal cueing     Toileting Toileting    Toileting assist Assist for toileting: Supervision/Verbal cueing     Transfers Chair/bed transfer  Transfers assist     Chair/bed transfer assist level: Supervision/Verbal cueing Chair/bed transfer assistive device: Armrests, Programmer, multimedia   Ambulation assist      Assist level: Contact Guard/Touching assist Assistive device: Walker-rolling(L knee brace) Max distance: 150ft   Walk 10 feet activity   Assist     Assist level: Contact Guard/Touching assist Assistive device: Walker-rolling, Other (comment)(L knee brace)   Walk 50 feet activity   Assist Walk 50 feet with 2 turns activity did not occur: Safety/medical concerns(Fatigue and weakness)  Assist level: Contact Guard/Touching assist Assistive device: Walker-rolling, Other (comment)(L knee brace)    Walk 150 feet activity   Assist Walk 150 feet activity did not occur: Safety/medical concerns         Walk 10 feet on uneven surface  activity   Assist Walk 10 feet  on uneven surfaces activity did not occur: Safety/medical concerns   Assist level: Contact Guard/Touching assist(up/down ramp) Assistive device: Aeronautical engineer Will patient use wheelchair at discharge?: No             Wheelchair 50 feet  with 2 turns activity    Assist            Wheelchair 150 feet activity     Assist            Medical Problem List and Plan: 1.  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  DC today  Will see patient for transitional care management in 1-2 weeks post-discharge 2.  Antithrombotics: -DVT/anticoagulation: Lovenox             -antiplatelet therapy: Aspirin 81 mg daily, Plavix 75 mg daily x3 months then Plavix alone 3. Pain Management: Tylenol as needed  -continue low dose gabapentin for neuropathic pain LLE   Controlled, maintain at discharge 4. Mood: Provide emotional support             -antipsychotic agents: N/A 5. Neuropsych: This patient is not capable of making decisions on his own behalf. 6. Skin/Wound Care: Routine skin checks 7. Fluids/Electrolytes/Nutrition: Routine in and outs 8.  Bilateral carotid stenosis.  Follow-up vascular surgery as outpatient. 9.  Diabetes mellitus with hyperglycemia.  Hemoglobin A1c 5.8.    Continue Glucotrol 5 mg daily.  Slightly labile on 10/17, will need ambulatory follow-up 10. Essential hypertension.  Patient on Norvasc, Tenormin 100 mg daily and lisinopril 5 mg daily prior to admission.  Resume as needed Vitals:   06/28/19 1958 06/29/19 0431  BP: (!) 148/54 (!) 153/54  Pulse: 61 (!) 58  Resp: 18 16  Temp: 97.8 F (36.6 C) 98.2 F (36.8 C)  SpO2: 99% 97%   Continue Norvasc 5mg   Tenormin 50 daily   Remains elevated on 10/17, will require ambulatory follow-up with likely further adjustments. 11.  CKD stage III.    Creatinine 2.01 on 10/12  Will need to improve oral fluid intake  Continue to monitor 12.  Hyperlipidemia.  Lipitor 13.  CAD with CABG 1997.  No chest pain or shortness of breath.  Continue aspirin and Plavix 14.  History of gout.  Zyloprim 300 mg daily.   Monitor for gout flareups 15.  Hard of hearing: Ensure patient wearing hearing aids 16.  History of asthma.  Monitor for increased shortness of breath.  Patient on albuterol inhaler as needed as well as theophylline 300 mg twice daily prior to admission. 17.  Hypoalbuminemia  Supplement initiated on 10/6 18.  Acute blood loss anemia  Hemoglobin 10.6 on 10/12  Continue to monitor 19.  [place] L brachial plexopathy with chronic contractures--at baseline  LOS: 12 days A FACE TO FACE EVALUATION WAS PERFORMED  [name] Lorie Phenix 06/29/2019, 9:05 AM

## 2019-06-29 NOTE — Progress Notes (Signed)
Patient discharged to home

## 2019-07-01 DIAGNOSIS — J45909 Unspecified asthma, uncomplicated: Secondary | ICD-10-CM | POA: Diagnosis not present

## 2019-07-01 DIAGNOSIS — E1122 Type 2 diabetes mellitus with diabetic chronic kidney disease: Secondary | ICD-10-CM | POA: Diagnosis not present

## 2019-07-01 DIAGNOSIS — N183 Chronic kidney disease, stage 3 unspecified: Secondary | ICD-10-CM | POA: Diagnosis not present

## 2019-07-01 DIAGNOSIS — I69354 Hemiplegia and hemiparesis following cerebral infarction affecting left non-dominant side: Secondary | ICD-10-CM | POA: Diagnosis not present

## 2019-07-01 DIAGNOSIS — M1712 Unilateral primary osteoarthritis, left knee: Secondary | ICD-10-CM | POA: Diagnosis not present

## 2019-07-01 DIAGNOSIS — M109 Gout, unspecified: Secondary | ICD-10-CM | POA: Diagnosis not present

## 2019-07-01 DIAGNOSIS — I6523 Occlusion and stenosis of bilateral carotid arteries: Secondary | ICD-10-CM | POA: Diagnosis not present

## 2019-07-01 DIAGNOSIS — E785 Hyperlipidemia, unspecified: Secondary | ICD-10-CM | POA: Diagnosis not present

## 2019-07-01 DIAGNOSIS — I129 Hypertensive chronic kidney disease with stage 1 through stage 4 chronic kidney disease, or unspecified chronic kidney disease: Secondary | ICD-10-CM | POA: Diagnosis not present

## 2019-07-01 DIAGNOSIS — I69322 Dysarthria following cerebral infarction: Secondary | ICD-10-CM | POA: Diagnosis not present

## 2019-07-01 DIAGNOSIS — I2510 Atherosclerotic heart disease of native coronary artery without angina pectoris: Secondary | ICD-10-CM | POA: Diagnosis not present

## 2019-07-01 DIAGNOSIS — H9190 Unspecified hearing loss, unspecified ear: Secondary | ICD-10-CM | POA: Diagnosis not present

## 2019-07-02 ENCOUNTER — Telehealth: Payer: Self-pay

## 2019-07-02 DIAGNOSIS — I69322 Dysarthria following cerebral infarction: Secondary | ICD-10-CM | POA: Diagnosis not present

## 2019-07-02 DIAGNOSIS — J45909 Unspecified asthma, uncomplicated: Secondary | ICD-10-CM | POA: Diagnosis not present

## 2019-07-02 DIAGNOSIS — N183 Chronic kidney disease, stage 3 unspecified: Secondary | ICD-10-CM | POA: Diagnosis not present

## 2019-07-02 DIAGNOSIS — I69354 Hemiplegia and hemiparesis following cerebral infarction affecting left non-dominant side: Secondary | ICD-10-CM | POA: Diagnosis not present

## 2019-07-02 DIAGNOSIS — E785 Hyperlipidemia, unspecified: Secondary | ICD-10-CM | POA: Diagnosis not present

## 2019-07-02 DIAGNOSIS — I6523 Occlusion and stenosis of bilateral carotid arteries: Secondary | ICD-10-CM | POA: Diagnosis not present

## 2019-07-02 DIAGNOSIS — E1122 Type 2 diabetes mellitus with diabetic chronic kidney disease: Secondary | ICD-10-CM | POA: Diagnosis not present

## 2019-07-02 DIAGNOSIS — H9190 Unspecified hearing loss, unspecified ear: Secondary | ICD-10-CM | POA: Diagnosis not present

## 2019-07-02 DIAGNOSIS — M109 Gout, unspecified: Secondary | ICD-10-CM | POA: Diagnosis not present

## 2019-07-02 DIAGNOSIS — I2510 Atherosclerotic heart disease of native coronary artery without angina pectoris: Secondary | ICD-10-CM | POA: Diagnosis not present

## 2019-07-02 DIAGNOSIS — M1712 Unilateral primary osteoarthritis, left knee: Secondary | ICD-10-CM | POA: Diagnosis not present

## 2019-07-02 DIAGNOSIS — I129 Hypertensive chronic kidney disease with stage 1 through stage 4 chronic kidney disease, or unspecified chronic kidney disease: Secondary | ICD-10-CM | POA: Diagnosis not present

## 2019-07-02 NOTE — Telephone Encounter (Signed)
Marzetta Board, PT from [facility] called requesting verbal orders for HHPT 2wk3. Bethena Roys, ST from [facility] called requesting verbal orders for HHST 1wk1, 2wk2. Orders approved and given.

## 2019-07-02 NOTE — Telephone Encounter (Signed)
Transitional Care call-wife    1. Are you/is patient experiencing any problems since coming home? No Are there any questions regarding any aspect of care? No 2. Are there any questions regarding medications administration/dosing? No Are meds being taken as prescribed? Yes Patient should review meds with caller to confirm 3. Have there been any falls? No 4. Has Home Health been to the house and/or have they contacted you? Yes If not, have you tried to contact them? Can we help you contact them? 5. Are bowels and bladder emptying properly? yes Are there any unexpected incontinence issues? No If applicable, is patient following bowel/bladder programs? 6. Any fevers, problems with breathing, unexpected pain? No 7. Are there any skin problems or new areas of breakdown? No 8. Has the patient/family member arranged specialty MD follow up (ie cardiology/neurology/renal/surgical/etc)? yes Can we help arrange? 9. Does the patient need any other services or support that we can help arrange? No   10. Are caregivers following through as expected in assisting the patient? yes 11. Has the patient quit smoking, drinking alcohol, or using drugs as recommended? Yes   Appointment time 8:40 am arrive time 8:20 am and with Dr. Naaman Plummer on 07/10/2019 [address]

## 2019-07-03 DIAGNOSIS — M159 Polyosteoarthritis, unspecified: Secondary | ICD-10-CM | POA: Diagnosis not present

## 2019-07-03 DIAGNOSIS — I639 Cerebral infarction, unspecified: Secondary | ICD-10-CM | POA: Diagnosis not present

## 2019-07-03 DIAGNOSIS — I6389 Other cerebral infarction: Secondary | ICD-10-CM | POA: Diagnosis not present

## 2019-07-03 DIAGNOSIS — I2510 Atherosclerotic heart disease of native coronary artery without angina pectoris: Secondary | ICD-10-CM | POA: Diagnosis not present

## 2019-07-04 DIAGNOSIS — J45909 Unspecified asthma, uncomplicated: Secondary | ICD-10-CM | POA: Diagnosis not present

## 2019-07-04 DIAGNOSIS — N183 Chronic kidney disease, stage 3 unspecified: Secondary | ICD-10-CM | POA: Diagnosis not present

## 2019-07-04 DIAGNOSIS — M1712 Unilateral primary osteoarthritis, left knee: Secondary | ICD-10-CM | POA: Diagnosis not present

## 2019-07-04 DIAGNOSIS — I69354 Hemiplegia and hemiparesis following cerebral infarction affecting left non-dominant side: Secondary | ICD-10-CM | POA: Diagnosis not present

## 2019-07-04 DIAGNOSIS — I6523 Occlusion and stenosis of bilateral carotid arteries: Secondary | ICD-10-CM | POA: Diagnosis not present

## 2019-07-04 DIAGNOSIS — M109 Gout, unspecified: Secondary | ICD-10-CM | POA: Diagnosis not present

## 2019-07-04 DIAGNOSIS — I129 Hypertensive chronic kidney disease with stage 1 through stage 4 chronic kidney disease, or unspecified chronic kidney disease: Secondary | ICD-10-CM | POA: Diagnosis not present

## 2019-07-04 DIAGNOSIS — I2510 Atherosclerotic heart disease of native coronary artery without angina pectoris: Secondary | ICD-10-CM | POA: Diagnosis not present

## 2019-07-04 DIAGNOSIS — E1122 Type 2 diabetes mellitus with diabetic chronic kidney disease: Secondary | ICD-10-CM | POA: Diagnosis not present

## 2019-07-04 DIAGNOSIS — H9190 Unspecified hearing loss, unspecified ear: Secondary | ICD-10-CM | POA: Diagnosis not present

## 2019-07-04 DIAGNOSIS — E785 Hyperlipidemia, unspecified: Secondary | ICD-10-CM | POA: Diagnosis not present

## 2019-07-04 DIAGNOSIS — I69322 Dysarthria following cerebral infarction: Secondary | ICD-10-CM | POA: Diagnosis not present

## 2019-07-05 DIAGNOSIS — I129 Hypertensive chronic kidney disease with stage 1 through stage 4 chronic kidney disease, or unspecified chronic kidney disease: Secondary | ICD-10-CM | POA: Diagnosis not present

## 2019-07-05 DIAGNOSIS — I69354 Hemiplegia and hemiparesis following cerebral infarction affecting left non-dominant side: Secondary | ICD-10-CM | POA: Diagnosis not present

## 2019-07-05 DIAGNOSIS — J45909 Unspecified asthma, uncomplicated: Secondary | ICD-10-CM | POA: Diagnosis not present

## 2019-07-05 DIAGNOSIS — I2510 Atherosclerotic heart disease of native coronary artery without angina pectoris: Secondary | ICD-10-CM | POA: Diagnosis not present

## 2019-07-05 DIAGNOSIS — E1122 Type 2 diabetes mellitus with diabetic chronic kidney disease: Secondary | ICD-10-CM | POA: Diagnosis not present

## 2019-07-05 DIAGNOSIS — N183 Chronic kidney disease, stage 3 unspecified: Secondary | ICD-10-CM | POA: Diagnosis not present

## 2019-07-05 DIAGNOSIS — E785 Hyperlipidemia, unspecified: Secondary | ICD-10-CM | POA: Diagnosis not present

## 2019-07-05 DIAGNOSIS — M109 Gout, unspecified: Secondary | ICD-10-CM | POA: Diagnosis not present

## 2019-07-05 DIAGNOSIS — I6523 Occlusion and stenosis of bilateral carotid arteries: Secondary | ICD-10-CM | POA: Diagnosis not present

## 2019-07-05 DIAGNOSIS — H9190 Unspecified hearing loss, unspecified ear: Secondary | ICD-10-CM | POA: Diagnosis not present

## 2019-07-05 DIAGNOSIS — I69322 Dysarthria following cerebral infarction: Secondary | ICD-10-CM | POA: Diagnosis not present

## 2019-07-05 DIAGNOSIS — M1712 Unilateral primary osteoarthritis, left knee: Secondary | ICD-10-CM | POA: Diagnosis not present

## 2019-07-08 DIAGNOSIS — H9190 Unspecified hearing loss, unspecified ear: Secondary | ICD-10-CM | POA: Diagnosis not present

## 2019-07-08 DIAGNOSIS — M1712 Unilateral primary osteoarthritis, left knee: Secondary | ICD-10-CM | POA: Diagnosis not present

## 2019-07-08 DIAGNOSIS — I69354 Hemiplegia and hemiparesis following cerebral infarction affecting left non-dominant side: Secondary | ICD-10-CM | POA: Diagnosis not present

## 2019-07-08 DIAGNOSIS — E1122 Type 2 diabetes mellitus with diabetic chronic kidney disease: Secondary | ICD-10-CM | POA: Diagnosis not present

## 2019-07-08 DIAGNOSIS — I69322 Dysarthria following cerebral infarction: Secondary | ICD-10-CM | POA: Diagnosis not present

## 2019-07-08 DIAGNOSIS — E785 Hyperlipidemia, unspecified: Secondary | ICD-10-CM | POA: Diagnosis not present

## 2019-07-08 DIAGNOSIS — M109 Gout, unspecified: Secondary | ICD-10-CM | POA: Diagnosis not present

## 2019-07-08 DIAGNOSIS — N183 Chronic kidney disease, stage 3 unspecified: Secondary | ICD-10-CM | POA: Diagnosis not present

## 2019-07-08 DIAGNOSIS — I6523 Occlusion and stenosis of bilateral carotid arteries: Secondary | ICD-10-CM | POA: Diagnosis not present

## 2019-07-08 DIAGNOSIS — I2510 Atherosclerotic heart disease of native coronary artery without angina pectoris: Secondary | ICD-10-CM | POA: Diagnosis not present

## 2019-07-08 DIAGNOSIS — J45909 Unspecified asthma, uncomplicated: Secondary | ICD-10-CM | POA: Diagnosis not present

## 2019-07-08 DIAGNOSIS — I129 Hypertensive chronic kidney disease with stage 1 through stage 4 chronic kidney disease, or unspecified chronic kidney disease: Secondary | ICD-10-CM | POA: Diagnosis not present

## 2019-07-09 DIAGNOSIS — I69354 Hemiplegia and hemiparesis following cerebral infarction affecting left non-dominant side: Secondary | ICD-10-CM | POA: Diagnosis not present

## 2019-07-09 DIAGNOSIS — M1712 Unilateral primary osteoarthritis, left knee: Secondary | ICD-10-CM | POA: Diagnosis not present

## 2019-07-09 DIAGNOSIS — E785 Hyperlipidemia, unspecified: Secondary | ICD-10-CM | POA: Diagnosis not present

## 2019-07-09 DIAGNOSIS — N183 Chronic kidney disease, stage 3 unspecified: Secondary | ICD-10-CM | POA: Diagnosis not present

## 2019-07-09 DIAGNOSIS — E1122 Type 2 diabetes mellitus with diabetic chronic kidney disease: Secondary | ICD-10-CM | POA: Diagnosis not present

## 2019-07-09 DIAGNOSIS — J45909 Unspecified asthma, uncomplicated: Secondary | ICD-10-CM | POA: Diagnosis not present

## 2019-07-09 DIAGNOSIS — I129 Hypertensive chronic kidney disease with stage 1 through stage 4 chronic kidney disease, or unspecified chronic kidney disease: Secondary | ICD-10-CM | POA: Diagnosis not present

## 2019-07-09 DIAGNOSIS — I69322 Dysarthria following cerebral infarction: Secondary | ICD-10-CM | POA: Diagnosis not present

## 2019-07-09 DIAGNOSIS — I6523 Occlusion and stenosis of bilateral carotid arteries: Secondary | ICD-10-CM | POA: Diagnosis not present

## 2019-07-09 DIAGNOSIS — H9190 Unspecified hearing loss, unspecified ear: Secondary | ICD-10-CM | POA: Diagnosis not present

## 2019-07-09 DIAGNOSIS — I2510 Atherosclerotic heart disease of native coronary artery without angina pectoris: Secondary | ICD-10-CM | POA: Diagnosis not present

## 2019-07-09 DIAGNOSIS — M109 Gout, unspecified: Secondary | ICD-10-CM | POA: Diagnosis not present

## 2019-07-10 ENCOUNTER — Encounter: Payer: Self-pay | Admitting: Physical Medicine & Rehabilitation

## 2019-07-10 ENCOUNTER — Other Ambulatory Visit: Payer: Self-pay

## 2019-07-10 ENCOUNTER — Ambulatory Visit (HOSPITAL_COMMUNITY)
Admission: RE | Admit: 2019-07-10 | Discharge: 2019-07-10 | Disposition: A | Discharge status: Home / Self Care | Payer: Medicare Other | Source: Ambulatory Visit | Attending: Physical Medicine & Rehabilitation | Admitting: Physical Medicine & Rehabilitation

## 2019-07-10 ENCOUNTER — Encounter: Payer: Medicare Other | Attending: Physical Medicine & Rehabilitation | Admitting: Physical Medicine & Rehabilitation

## 2019-07-10 VITALS — BP 165/69 | HR 69 | Temp 97.9°F | Ht 70.0 in | Wt 183.0 lb

## 2019-07-10 DIAGNOSIS — I6381 Other cerebral infarction due to occlusion or stenosis of small artery: Secondary | ICD-10-CM

## 2019-07-10 DIAGNOSIS — M1712 Unilateral primary osteoarthritis, left knee: Secondary | ICD-10-CM | POA: Diagnosis not present

## 2019-07-10 DIAGNOSIS — M25462 Effusion, left knee: Secondary | ICD-10-CM | POA: Diagnosis not present

## 2019-07-10 DIAGNOSIS — H9190 Unspecified hearing loss, unspecified ear: Secondary | ICD-10-CM | POA: Diagnosis not present

## 2019-07-10 DIAGNOSIS — I69322 Dysarthria following cerebral infarction: Secondary | ICD-10-CM | POA: Diagnosis not present

## 2019-07-10 DIAGNOSIS — I639 Cerebral infarction, unspecified: Secondary | ICD-10-CM

## 2019-07-10 DIAGNOSIS — E785 Hyperlipidemia, unspecified: Secondary | ICD-10-CM | POA: Diagnosis not present

## 2019-07-10 DIAGNOSIS — E1122 Type 2 diabetes mellitus with diabetic chronic kidney disease: Secondary | ICD-10-CM | POA: Diagnosis not present

## 2019-07-10 DIAGNOSIS — M109 Gout, unspecified: Secondary | ICD-10-CM | POA: Diagnosis not present

## 2019-07-10 DIAGNOSIS — J45909 Unspecified asthma, uncomplicated: Secondary | ICD-10-CM | POA: Diagnosis not present

## 2019-07-10 DIAGNOSIS — I69354 Hemiplegia and hemiparesis following cerebral infarction affecting left non-dominant side: Secondary | ICD-10-CM | POA: Diagnosis not present

## 2019-07-10 DIAGNOSIS — I2510 Atherosclerotic heart disease of native coronary artery without angina pectoris: Secondary | ICD-10-CM | POA: Diagnosis not present

## 2019-07-10 DIAGNOSIS — I129 Hypertensive chronic kidney disease with stage 1 through stage 4 chronic kidney disease, or unspecified chronic kidney disease: Secondary | ICD-10-CM | POA: Diagnosis not present

## 2019-07-10 DIAGNOSIS — I6523 Occlusion and stenosis of bilateral carotid arteries: Secondary | ICD-10-CM | POA: Diagnosis not present

## 2019-07-10 DIAGNOSIS — N183 Chronic kidney disease, stage 3 unspecified: Secondary | ICD-10-CM | POA: Diagnosis not present

## 2019-07-10 NOTE — Patient Instructions (Signed)
PLEASE FEEL FREE TO CALL OUR OFFICE WITH ANY PROBLEMS OR QUESTIONS ([phone])      

## 2019-07-10 NOTE — Progress Notes (Signed)
Subjective:    Patient ID: [name], male    DOB: 05-Mar-1936, 83 y.o.   MRN: [id number]  HPI   This is a transitional care visit for Mr [name]. He was with [place] on rehab until 10 days ago after his right thalamic/IC infarct.  Wife states that from a neurological standpoint he has shown gradual improvement and that she feels that he is close to baseline from a cognitive standpoint.  However his left knee has continued to worsen.  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  He is using his walker and various knee braces with poor results so far.  He struggles with weightbearing and weight shift due to pain.  He has seen Dr. Dorna Leitz in the past.  He is not taking anything for pain except for Tylenol.  He has not used iced regularly to this point.       Pain Inventory Average Pain 5 Pain Right Now 5 My pain is intermittent, dull and aching  In the last 24 hours, has pain interfered with the following? General activity 1 Relation with others 1 Enjoyment of life 1 What TIME of day is your pain at its worst? daytime Sleep (in general) Good  Pain is worse with: walking, standing and some activites Pain improves with: rest, medication and injections Relief from Meds: 4  Mobility walk without assistance use a walker ability to climb steps?  yes do you drive?  yes  Function retired  Neuro/Psych trouble walking anxiety  Prior Studies Any changes since last visit?  no  Physicians involved in your care Any changes since last visit?  no   Family History  Problem Relation Age of Onset  . Anesthesia problems Neg Hx   . Hypotension Neg Hx   . Malignant hyperthermia Neg Hx   . Pseudochol deficiency Neg Hx    Social History   Socioeconomic History  . Marital status: Married    Spouse name: Not on file  . Number of children: Not on file  . Years of education: Not on file  . Highest education level:  Not on file  Occupational History  . Not on file  Social Needs  . Financial resource strain: Not on file  . Food insecurity    Worry: Not on file    Inability: Not on file  . Transportation needs    Medical: Not on file    Non-medical: Not on file  Tobacco Use  . Smoking status: Never Smoker  . Smokeless tobacco: Never Used  Substance and Sexual Activity  . Alcohol use: No  . Drug use: No  . Sexual activity: Yes  Lifestyle  . Physical activity    Days per week: Not on file    Minutes per session: Not on file  . Stress: Not on file  Relationships  . Social Herbalist on phone: Not on file    Gets together: Not on file    Attends religious service: Not on file    Active member of club or organization: Not on file    Attends meetings of clubs or organizations: Not on file    Relationship status: Not on file  Other Topics Concern  . Not on file  Social History Narrative  . Not on file   Past Surgical History:  Procedure Laterality Date  . CATARACT EXTRACTION W/PHACO  11/28/2011   Procedure: CATARACT EXTRACTION PHACO  AND INTRAOCULAR LENS PLACEMENT (IOC);  Surgeon: Williams Che, MD;  Location: [facility];  Service: Ophthalmology;  Laterality: Right;  CDE 3.84  . CORONARY ARTERY BYPASS GRAFT  1997   6 vessels  . EYE SURGERY  1995   left eye cataract removal   . TOTAL KNEE  PROSTHESIS REMOVAL W/ SPACER INSERTION  8 yrs ago   right   Past Medical History:  Diagnosis Date  . Anxiety   . Arthritis   . Asthma   . Coronary artery disease   . Diabetes mellitus without complication ([place])   . Gout   . HOH (hard of hearing) bilateral  . Hypertension   . PONV (postoperative nausea and vomiting)   . Right thalamic infarction ([place]) 06/17/2019   There were no vitals taken for this visit.  Opioid Risk Score:   Fall Risk Score:  `1  Depression screen PHQ 2/9  No flowsheet data found.   Review of Systems  Constitutional: Negative.   HENT: Negative.   Eyes:  Negative.   Respiratory: Positive for shortness of breath and wheezing.   Cardiovascular: Positive for leg swelling.  Gastrointestinal: Negative.   Endocrine: Negative.   Genitourinary: Positive for difficulty urinating.  Musculoskeletal: Positive for arthralgias and gait problem.  Skin: Negative.   Allergic/Immunologic: Negative.   Hematological: Negative.   Psychiatric/Behavioral: The patient is nervous/anxious.   All other systems reviewed and are negative.      Objective:   Physical Exam Gen: no distress, normal appearing HEENT: oral mucosa pink and moist, NCAT Cardio: Reg rate Chest: normal effort, normal rate of breathing Abd: soft, non-distended Ext: no edema Skin: intact Neuro: HOH. Chronic left upper extremity weakness. LLE 3-4/5. With pain inhibition. Wide based gait.  Musculoskeletal:left knee with mild effusion, +crepitus , knee sleeve. antalgia Psych: pleasant, normal affect       Assessment & Plan:  Medical Problem List and Plan: 1. 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             -HH therapies.  Continue to focus on knee strengthening exercise as well as balance. 2. Antithrombotics: -antiplatelet therapy: Aspirin 81 mg daily, Plavix 75 mg daily x3 months then Plavix alone 3. Pain Management: hx of right TKA, OA of left knee            -left knee worsening.   -has seen Dorna Leitz.  4. Bilateral carotid stenosis. Follow-up vascular surgery    After informed consent and preparation of the skin with betadine and isopropyl alcohol, I injected 6mg  (1cc) of celestone and 4cc of 1% lidocaine into the left knee via anterolateral approach. Additionally, aspiration was performed prior to injection. The patient tolerated well, and no complications were encountered. Afterward the area was cleaned and dressed.  Post- injection instructions were provided.  9. Diabetes mellitus with hyperglycemia.   -per primary 10. Essential hypertension. lisiinopril               Norvasc 5mg              Tenormin 50 daily  11. CKD stage III.  12. Hyperlipidemia. Lipitor 13. CAD with CABG 1997. No chest pain or shortness of breath. Continue aspirin and Plavix 14. History of gout. Zyloprim 300 mg daily.  15.  Congential L brachial plexopathy with chronic contractures--at baseline   Thirty minutes of face to face patient care  time were spent during this visit. All questions were encouraged and answered. Follow up with me in 2 months.

## 2019-07-11 DIAGNOSIS — I69322 Dysarthria following cerebral infarction: Secondary | ICD-10-CM | POA: Diagnosis not present

## 2019-07-11 DIAGNOSIS — H9190 Unspecified hearing loss, unspecified ear: Secondary | ICD-10-CM | POA: Diagnosis not present

## 2019-07-11 DIAGNOSIS — N183 Chronic kidney disease, stage 3 unspecified: Secondary | ICD-10-CM | POA: Diagnosis not present

## 2019-07-11 DIAGNOSIS — M109 Gout, unspecified: Secondary | ICD-10-CM | POA: Diagnosis not present

## 2019-07-11 DIAGNOSIS — I69354 Hemiplegia and hemiparesis following cerebral infarction affecting left non-dominant side: Secondary | ICD-10-CM | POA: Diagnosis not present

## 2019-07-11 DIAGNOSIS — E1122 Type 2 diabetes mellitus with diabetic chronic kidney disease: Secondary | ICD-10-CM | POA: Diagnosis not present

## 2019-07-11 DIAGNOSIS — J45909 Unspecified asthma, uncomplicated: Secondary | ICD-10-CM | POA: Diagnosis not present

## 2019-07-11 DIAGNOSIS — I6523 Occlusion and stenosis of bilateral carotid arteries: Secondary | ICD-10-CM | POA: Diagnosis not present

## 2019-07-11 DIAGNOSIS — I2510 Atherosclerotic heart disease of native coronary artery without angina pectoris: Secondary | ICD-10-CM | POA: Diagnosis not present

## 2019-07-11 DIAGNOSIS — E785 Hyperlipidemia, unspecified: Secondary | ICD-10-CM | POA: Diagnosis not present

## 2019-07-11 DIAGNOSIS — I129 Hypertensive chronic kidney disease with stage 1 through stage 4 chronic kidney disease, or unspecified chronic kidney disease: Secondary | ICD-10-CM | POA: Diagnosis not present

## 2019-07-11 DIAGNOSIS — M1712 Unilateral primary osteoarthritis, left knee: Secondary | ICD-10-CM | POA: Diagnosis not present

## 2019-07-12 DIAGNOSIS — I69354 Hemiplegia and hemiparesis following cerebral infarction affecting left non-dominant side: Secondary | ICD-10-CM | POA: Diagnosis not present

## 2019-07-12 DIAGNOSIS — I2510 Atherosclerotic heart disease of native coronary artery without angina pectoris: Secondary | ICD-10-CM | POA: Diagnosis not present

## 2019-07-12 DIAGNOSIS — E1122 Type 2 diabetes mellitus with diabetic chronic kidney disease: Secondary | ICD-10-CM | POA: Diagnosis not present

## 2019-07-12 DIAGNOSIS — J45909 Unspecified asthma, uncomplicated: Secondary | ICD-10-CM | POA: Diagnosis not present

## 2019-07-12 DIAGNOSIS — N183 Chronic kidney disease, stage 3 unspecified: Secondary | ICD-10-CM | POA: Diagnosis not present

## 2019-07-12 DIAGNOSIS — I129 Hypertensive chronic kidney disease with stage 1 through stage 4 chronic kidney disease, or unspecified chronic kidney disease: Secondary | ICD-10-CM | POA: Diagnosis not present

## 2019-07-12 DIAGNOSIS — M109 Gout, unspecified: Secondary | ICD-10-CM | POA: Diagnosis not present

## 2019-07-12 DIAGNOSIS — M1712 Unilateral primary osteoarthritis, left knee: Secondary | ICD-10-CM | POA: Diagnosis not present

## 2019-07-12 DIAGNOSIS — I69322 Dysarthria following cerebral infarction: Secondary | ICD-10-CM | POA: Diagnosis not present

## 2019-07-12 DIAGNOSIS — E785 Hyperlipidemia, unspecified: Secondary | ICD-10-CM | POA: Diagnosis not present

## 2019-07-12 DIAGNOSIS — H9190 Unspecified hearing loss, unspecified ear: Secondary | ICD-10-CM | POA: Diagnosis not present

## 2019-07-12 DIAGNOSIS — I6523 Occlusion and stenosis of bilateral carotid arteries: Secondary | ICD-10-CM | POA: Diagnosis not present

## 2019-07-16 DIAGNOSIS — I6523 Occlusion and stenosis of bilateral carotid arteries: Secondary | ICD-10-CM | POA: Diagnosis not present

## 2019-07-16 DIAGNOSIS — I2510 Atherosclerotic heart disease of native coronary artery without angina pectoris: Secondary | ICD-10-CM | POA: Diagnosis not present

## 2019-07-16 DIAGNOSIS — I69354 Hemiplegia and hemiparesis following cerebral infarction affecting left non-dominant side: Secondary | ICD-10-CM | POA: Diagnosis not present

## 2019-07-16 DIAGNOSIS — N183 Chronic kidney disease, stage 3 unspecified: Secondary | ICD-10-CM | POA: Diagnosis not present

## 2019-07-16 DIAGNOSIS — E1122 Type 2 diabetes mellitus with diabetic chronic kidney disease: Secondary | ICD-10-CM | POA: Diagnosis not present

## 2019-07-16 DIAGNOSIS — I129 Hypertensive chronic kidney disease with stage 1 through stage 4 chronic kidney disease, or unspecified chronic kidney disease: Secondary | ICD-10-CM | POA: Diagnosis not present

## 2019-07-16 DIAGNOSIS — E785 Hyperlipidemia, unspecified: Secondary | ICD-10-CM | POA: Diagnosis not present

## 2019-07-16 DIAGNOSIS — H9190 Unspecified hearing loss, unspecified ear: Secondary | ICD-10-CM | POA: Diagnosis not present

## 2019-07-16 DIAGNOSIS — J45909 Unspecified asthma, uncomplicated: Secondary | ICD-10-CM | POA: Diagnosis not present

## 2019-07-16 DIAGNOSIS — M109 Gout, unspecified: Secondary | ICD-10-CM | POA: Diagnosis not present

## 2019-07-16 DIAGNOSIS — M1712 Unilateral primary osteoarthritis, left knee: Secondary | ICD-10-CM | POA: Diagnosis not present

## 2019-07-16 DIAGNOSIS — I69322 Dysarthria following cerebral infarction: Secondary | ICD-10-CM | POA: Diagnosis not present

## 2019-07-18 DIAGNOSIS — I129 Hypertensive chronic kidney disease with stage 1 through stage 4 chronic kidney disease, or unspecified chronic kidney disease: Secondary | ICD-10-CM | POA: Diagnosis not present

## 2019-07-18 DIAGNOSIS — N183 Chronic kidney disease, stage 3 unspecified: Secondary | ICD-10-CM | POA: Diagnosis not present

## 2019-07-18 DIAGNOSIS — M109 Gout, unspecified: Secondary | ICD-10-CM | POA: Diagnosis not present

## 2019-07-18 DIAGNOSIS — I69322 Dysarthria following cerebral infarction: Secondary | ICD-10-CM | POA: Diagnosis not present

## 2019-07-18 DIAGNOSIS — E785 Hyperlipidemia, unspecified: Secondary | ICD-10-CM | POA: Diagnosis not present

## 2019-07-18 DIAGNOSIS — M1712 Unilateral primary osteoarthritis, left knee: Secondary | ICD-10-CM | POA: Diagnosis not present

## 2019-07-18 DIAGNOSIS — I6523 Occlusion and stenosis of bilateral carotid arteries: Secondary | ICD-10-CM | POA: Diagnosis not present

## 2019-07-18 DIAGNOSIS — I2510 Atherosclerotic heart disease of native coronary artery without angina pectoris: Secondary | ICD-10-CM | POA: Diagnosis not present

## 2019-07-18 DIAGNOSIS — E1122 Type 2 diabetes mellitus with diabetic chronic kidney disease: Secondary | ICD-10-CM | POA: Diagnosis not present

## 2019-07-18 DIAGNOSIS — H9190 Unspecified hearing loss, unspecified ear: Secondary | ICD-10-CM | POA: Diagnosis not present

## 2019-07-18 DIAGNOSIS — I69354 Hemiplegia and hemiparesis following cerebral infarction affecting left non-dominant side: Secondary | ICD-10-CM | POA: Diagnosis not present

## 2019-07-18 DIAGNOSIS — J45909 Unspecified asthma, uncomplicated: Secondary | ICD-10-CM | POA: Diagnosis not present

## 2019-07-28 DIAGNOSIS — I639 Cerebral infarction, unspecified: Secondary | ICD-10-CM | POA: Diagnosis not present

## 2019-08-01 ENCOUNTER — Encounter: Payer: Self-pay | Admitting: Adult Health

## 2019-08-01 ENCOUNTER — Other Ambulatory Visit: Payer: Self-pay

## 2019-08-01 ENCOUNTER — Ambulatory Visit: Payer: Medicare Other | Admitting: Adult Health

## 2019-08-01 VITALS — BP 138/58 | HR 69 | Temp 97.9°F | Ht 70.0 in | Wt 181.4 lb

## 2019-08-01 DIAGNOSIS — I10 Essential (primary) hypertension: Secondary | ICD-10-CM | POA: Diagnosis not present

## 2019-08-01 DIAGNOSIS — E785 Hyperlipidemia, unspecified: Secondary | ICD-10-CM

## 2019-08-01 DIAGNOSIS — E1165 Type 2 diabetes mellitus with hyperglycemia: Secondary | ICD-10-CM

## 2019-08-01 DIAGNOSIS — I6381 Other cerebral infarction due to occlusion or stenosis of small artery: Secondary | ICD-10-CM

## 2019-08-01 DIAGNOSIS — I6523 Occlusion and stenosis of bilateral carotid arteries: Secondary | ICD-10-CM | POA: Diagnosis not present

## 2019-08-01 DIAGNOSIS — I639 Cerebral infarction, unspecified: Secondary | ICD-10-CM | POA: Diagnosis not present

## 2019-08-01 DIAGNOSIS — F419 Anxiety disorder, unspecified: Secondary | ICD-10-CM

## 2019-08-01 NOTE — Patient Instructions (Signed)
Continue clopidogrel 75 mg daily  and atorvastatin for secondary stroke prevention  Discontinue aspirin continue Plavix alone  Continue to follow up with PCP regarding cholesterol and blood pressure management   Continue to monitor blood pressure at home  Graduated return to driving as recommended.  It is recommended that you first drive with another licensed driver in an empty parking lot. If you do well with this, you can drive on a quiet street with the licensed driver.  If you do well with this, you can drive on a busy street with a licensed driver.  If you continue to do well, you can be cleared to drive independently.  For the first month after resuming driving, it is recommend no nighttime, busy/heavy traffic roads or Interstate driving.   Speak with your PCP regarding restart of Xanax to help with occasional increased anxiety  Follow-up with vascular surgery as scheduled on 08/13/2019  Maintain strict control of hypertension with blood pressure goal below 130/90, diabetes with hemoglobin A1c goal below 6.5% and cholesterol with LDL cholesterol (bad cholesterol) goal below 70 mg/dL. I also advised the patient to eat a healthy diet with plenty of whole grains, cereals, fruits and vegetables, exercise regularly and maintain ideal body weight.  Followup in the future with me in 3 months or call earlier if needed       Thank you for coming to see [place] at [facility]. I hope we have been able to provide you high quality care today.  You may receive a patient satisfaction survey over the next few weeks. We would appreciate your feedback and comments so that we may continue to improve ourselves and the health of our patients.

## 2019-08-01 NOTE — Progress Notes (Signed)
[facility] [address] [place]. [place] [address] [phone]       HOSPITAL FOLLOW UP NOTE  [name] Date of Birth:  1935-11-07 Medical Record Number:  [id number]   Reason for Referral:  hospital stroke follow up    CHIEF COMPLAINT:  Chief Complaint  Patient presents with   Cerebrovascular Accident    rm 9, hospital FU, wife- [name], "completed ST, OT, PT"    HPI: [name] being seen today for in office hospital follow-up regarding right thalamic/internal capsule secondary to small vessel disease on 06/02/2019.  History obtained from patient, wife and chart review. Reviewed all radiology images and labs personally.  Mr.[name] a 83 y.o.malewith history of DB, CAD, HTN who was seen at [facility] 06/04/2019 following a fall and was discharged home with diagnosis of esophagitis.  He presented again to [place] 06/10/2019 withdifficulty speaking and confusion and was transferred to [facility] for further evaluation.   Stroke work-up showed right thalamic/internal capsule infarct in setting of intracranial arthrosclerosis and small vessel disease with recent dehydration following episode of abdominal pain (admission on 06/04/2019).  In addition to acute infarct, MRI also showed right basal ganglia and left occipital cortex infarcts.  CTA head/neck showed occluded right PCA, left evaluation of severe stenosis at origin, distal right CCA 80%, proximal right ICA 60% stenosis, proximal left ICA 80% stenosis, moderate right ICA and mild left ICA intracranial atherosclerosis as well as aortic arthrosclerosis.  Carotid Doppler showed bilateral ICA 60 to 79% stenosis.  2D echo showed normal EF without cardiac source of embolus.  LDL 66.  A1c 5.8.  Recommended DAPT for 3 weeks and then Plavix alone.  Carotid revascularization not indicated at this time due to general poor medical status and frequent infarcts likely due to small vessel  disease but may consider elective referral to vascular surgery outpatient for further evaluation.  He was discharged to [facility] for ongoing therapies.  [name] is a 83 year old male who is being seen today for stroke follow-up accompanied by his wife.  Greatly recovered from a stroke standpoint with mild LLE weakness and only occasional short-term memory impairment but wife believes more age-related.  He also has occasional "delayed processing" of information but also potentially related to hearing loss.  Continues to use rolling walker on occasion due to residual mild LLE weakness along with chronic left knee pain.  He did recently receive an injection by physical medicine and rehab Dr. Naaman Plummer with great benefit.  He has since completed home therapy but continues to do exercises daily.  He has continued on DAPT without bleeding or bruising.  Continues on atorvastatin without myalgias.  Blood pressure today initially elevated but on repeat 138/58.  He does have initial evaluation with vascular surgery Dr. Donnetta Hutching on 08/13/2019.  Wife does endorse increased anxiety but was previously on Xanax as needed which was discontinued at hospital discharge.  She questions if this can be restarted.  Denies new or worsening stroke/TIA symptoms.    ROS:   14 system review of systems performed and negative with exception of pain, weakness, hearing impairment  PMH:  Past Medical History:  Diagnosis Date   Anxiety    Arthritis    Asthma    Cancer ([place])    prostate   Coronary artery disease    Diabetes mellitus without complication ([place])    Gout    High cholesterol    HOH (hard of hearing) bilateral  Hypertension    PONV (postoperative nausea and vomiting)    Right thalamic infarction ([place]) 06/17/2019   Shingles     PSH:  Past Surgical History:  Procedure Laterality Date   CATARACT EXTRACTION W/PHACO  11/28/2011   Procedure: CATARACT EXTRACTION PHACO AND INTRAOCULAR LENS PLACEMENT ([place]);   Surgeon: Williams Che, MD;  Location: [facility];  Service: Ophthalmology;  Laterality: Right;  CDE 3.84   CORONARY ARTERY BYPASS GRAFT  1997   6 vessels   EYE SURGERY  1995   left eye cataract removal    TOTAL KNEE  PROSTHESIS REMOVAL W/ SPACER INSERTION  8 yrs ago   right    Social History:  Social History   Socioeconomic History   Marital status: Married    Spouse name: [name]   Number of children: 1   Years of education: 12   Highest education level: Not on file  Occupational History   Not on file  Social Needs   Financial resource strain: Not on file   Food insecurity    Worry: Not on file    Inability: Not on file   Transportation needs    Medical: Not on file    Non-medical: Not on file  Tobacco Use   Smoking status: Never Smoker   Smokeless tobacco: Never Used  Substance and Sexual Activity   Alcohol use: No   Drug use: No   Sexual activity: Yes  Lifestyle   Physical activity    Days per week: Not on file    Minutes per session: Not on file   Stress: Not on file  Relationships   Social connections    Talks on phone: Not on file    Gets together: Not on file    Attends religious service: Not on file    Active member of club or organization: Not on file    Attends meetings of clubs or organizations: Not on file    Relationship status: Not on file   Intimate partner violence    Fear of current or ex partner: Not on file    Emotionally abused: Not on file    Physically abused: Not on file    Forced sexual activity: Not on file  Other Topics Concern   Not on file  Social History Narrative   Lives with wife   No caffeine    Family History:  Family History  Problem Relation Age of Onset   Heart disease Mother    Heart attack Brother    Heart attack Paternal Grandfather    Anesthesia problems Neg Hx    Hypotension Neg Hx    Malignant hyperthermia Neg Hx    Pseudochol deficiency Neg Hx     Medications:   Current  Outpatient Medications on File Prior to Visit  Medication Sig Dispense Refill   acetaminophen (TYLENOL) 325 MG tablet Take 2 tablets (650 mg total) by mouth every 4 (four) hours as needed for mild pain (or temp > 37.5 C (99.5 F)).     albuterol (VENTOLIN HFA) 108 (90 Base) MCG/ACT inhaler Inhale 1-2 puffs into the lungs every 6 (six) hours as needed for wheezing or shortness of breath. 6.7 g 1   allopurinol (ZYLOPRIM) 300 MG tablet Take 1 tablet (300 mg total) by mouth at bedtime. 30 tablet 0   amLODipine (NORVASC) 5 MG tablet Take 1 tablet (5 mg total) by mouth every morning. 30 tablet 0   atenolol (TENORMIN) 50 MG tablet Take 1 tablet (50 mg  total) by mouth daily. 30 tablet 1   atorvastatin (LIPITOR) 40 MG tablet Take 0.5 tablets (20 mg total) by mouth at bedtime. 30 tablet 0   blood glucose meter kit and supplies KIT Dispense based on patient and insurance preference. Use up to four times daily as directed. (FOR ICD-9 250.00, 250.01). 1 each 0   cholecalciferol (VITAMIN D3) 25 MCG (1000 UT) tablet Take 1 tablet (1,000 Units total) by mouth every evening. 30 tablet 0   clopidogrel (PLAVIX) 75 MG tablet Take 1 tablet (75 mg total) by mouth daily. 30 tablet 0   famotidine (PEPCID) 20 MG tablet Take 1 tablet (20 mg total) by mouth daily. 30 tablet 0   furosemide (LASIX) 20 MG tablet Take 20 mg by mouth every morning. *May take as needed for fluid retention     glipiZIDE (GLUCOTROL) 5 MG tablet Take 1 tablet (5 mg total) by mouth every morning. 30 tablet 1   loratadine (CLARITIN) 10 MG tablet Take 10 mg by mouth daily.     Lutein 20 MG CAPS Take 1 capsule by mouth every morning.      theophylline (THEODUR) 300 MG 12 hr tablet Take 300 mg by mouth 2 (two) times daily.     vitamin B-12 (CYANOCOBALAMIN) 250 MCG tablet Take 250 mcg by mouth every evening.     vitamin C (ASCORBIC ACID) 500 MG tablet Take 500 mg by mouth every evening.     VITAMIN E COMPLEX PO Take 1 capsule by mouth  every evening.     Current Facility-Administered Medications on File Prior to Visit  Medication Dose Route Frequency Provider Last Rate Last Dose   fentaNYL (SUBLIMAZE) injection 25-50 mcg  25-50 mcg Intravenous Q5 min PRN Lerry Liner, MD        Allergies:   Allergies  Allergen Reactions   Nsaids Nausea And Vomiting    Issues with gallbladder     Physical Exam  Vitals:   08/01/19 1248  BP: (!) 138/58  Pulse: 69  Temp: 97.9 F (36.6 C)  Weight: 181 lb 6.4 oz (82.3 kg)  Height: '5\' 10"'$  (1.778 m)   Body mass index is 26.03 kg/m. No exam data present  Depression screen [facility] 2/9 08/01/2019  Decreased Interest 0  Down, Depressed, Hopeless 0  PHQ - 2 Score 0  Altered sleeping 1  Tired, decreased energy 0  Change in appetite 0  Feeling bad or failure about yourself  0  Trouble concentrating 0  Moving slowly or fidgety/restless 0  Suicidal thoughts 0  PHQ-9 Score 1     General: well developed, well nourished,  pleasant elderly Caucasian male, seated, in no evident distress Head: head normocephalic and atraumatic.   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 Oriented to place and time. Recent memory occasionally diminished and remote memory intact. Attention span, concentration and fund of knowledge appropriate. Mood and affect appropriate.  Cranial Nerves: Fundoscopic exam reveals sharp disc margins. Pupils equal, briskly reactive to light. Extraocular movements full without nystagmus. Visual fields full to confrontation.  Severely hard of hearing with bilateral hearing aids. Facial sensation intact. Face, tongue, palate moves normally and symmetrically.  Motor: Normal bulk and tone.  LUE: Chronic weakness from arm deformity LLE: 4+/5  left hip flexor but strong knee flexion/extension and  ankle plantar flexion/dorsiflexion Full strength right upper and lower extremity Sensory.: intact to touch , pinprick , position and vibratory sensation.  Coordination: Rapid alternating movements normal in all extremities except left hand. Finger-to-nose performed accurately on right side and heel-to-shin performed accurately bilaterally. Gait and Station: Arises from chair with mild difficulty. Stance is slightly hunched. Gait demonstrates  favoring of left leg but no evidence of imbalance with use of rolling walker Reflexes: 1+ and symmetric. Toes downgoing.     NIHSS  0 Modified Rankin  2    Diagnostic Data (Labs, Imaging, Testing)  CT HEAD WO CONTRAST 06/10/2019 IMPRESSION: 1. No evidence of acute intracranial abnormality.  ASPECTS of 10. 2. Chronic small vessel ischemia with small chronic infarcts as above.  CT ANGIO HEAD W OR WO CONTRAST CT ANGIO NECK W OR WO CONTRAST CT CEREBRAL PERFUSION W CONTRAST 06/10/2019 IMPRESSION: 1. Occluded right vertebral artery. 2. Patent left vertebral artery with moderate to severe stenosis of its origin. 3. 80% distal right common carotid artery stenosis and 60% proximal right ICA stenosis. 4. 80% proximal left ICA stenosis. 5. Moderate right and mild left intracranial ICA stenoses. 6. Negative CTP. 7.  Aortic Atherosclerosis (ICD10-I70.0).  MR BRAIN WO CONTRAST 06/11/2019 IMPRESSION: 1. Small acute infarcts at the medial right thalamus and right internal capsule. 2. Chronic ischemic injury including remote right basal ganglia infarct and left occipital cortex infarct.    ASSESSMENT: [name] is a 83 y.o. year old male presented with slurred speech and confusion on 06/10/2019 with stroke work-up showing right thalamus and right internal capsule infarcts secondary to small vessel disease. Vascular risk factors include HTN, HLD, DM, prior strokes and extensive intracranial  and extracranial stenosis.  Recovering well from a stroke standpoint with residual mild LLE weakness with knee arthritis possibly contributing    PLAN:  1. Right thalamus/internal capsule stroke: Continue clopidogrel 75 mg daily  and atorvastatin for secondary stroke prevention.  Stroke etiology small vessel disease therefore advised to discontinue aspirin and continue Plavix alone.  Maintain strict control of hypertension with blood pressure goal below 130/90, diabetes with hemoglobin A1c goal below 6.5% and cholesterol with LDL cholesterol (bad cholesterol) goal below 70 mg/dL.  I also advised the patient to eat a healthy diet with plenty of whole grains, cereals, fruits and vegetables, exercise regularly with at least 30 minutes of continuous activity daily and maintain ideal body weight. 2. HTN: Advised to continue current treatment regimen.  Today's BP 130/58.  Advised to continue to monitor at home along with continued follow-up with PCP for management 3. HLD: Advised to continue current treatment regimen along with continued follow-up with PCP for future prescribing and monitoring of lipid panel 4. DMII: Advised to continue to monitor glucose levels at home along with continued follow-up with PCP for management and monitoring 5. Occluded right VA, bilateral carotid stenosis: Initial evaluation by vascular surgery on 08/13/2019 for potential management 6. Mild residual deficits: Ongoing HEP for ongoing improvement.  As patient greatly recovered, discussion regarding return to driving.  No indication for memory or cognitive impairment or physical impairment that would hinder driving ability.  Discussion regarding gradual return to driving: Graduated return to driving instructions were provided. It is recommended that the patient first drives with another licensed driver in an empty parking lot. If the patient does well, they can drive on a quiet street with the licensed driver. If the patient does  well, they can drive on a busy street with a licensed  driver.  If patient continues to do well, patient will be cleared to drive independently.  For the first month after resuming driving, it is recommend no nighttime, busy/heavy traffic roads or Interstate driving.  7. Anxiety: Chronic history of anxiety with prior use of Xanax as needed.  Advised to discuss with PCP as this was discontinued during hospitalization due to altered mental status    Follow up in 3 months or call earlier if needed   Greater than 50% of time during this 45 minute visit was spent on counseling, explanation of diagnosis of right common/internal capsule stroke, reviewing risk factor management of HTN, HLD, DM, intracranial extracranial stenosis, planning of further management along with potential future management, and discussion with patient and family answering all questions.    Frann Rider, AGNP-BC  [facility] [address] [place], [place] [address]  Phone [phone] Fax [phone] Note: This document was prepared with digital dictation and possible smart phrase technology. Any transcriptional errors that result from this process are unintentional.

## 2019-08-02 NOTE — Progress Notes (Signed)
I agree with the above plan 

## 2019-08-09 ENCOUNTER — Other Ambulatory Visit: Payer: Self-pay

## 2019-08-09 DIAGNOSIS — I6523 Occlusion and stenosis of bilateral carotid arteries: Secondary | ICD-10-CM

## 2019-08-13 ENCOUNTER — Ambulatory Visit: Payer: Medicare Other | Admitting: Vascular Surgery

## 2019-08-13 ENCOUNTER — Encounter: Payer: Self-pay | Admitting: Vascular Surgery

## 2019-08-13 ENCOUNTER — Ambulatory Visit (HOSPITAL_COMMUNITY)
Admission: RE | Admit: 2019-08-13 | Discharge: 2019-08-13 | Disposition: A | Discharge status: Home / Self Care | Payer: Medicare Other | Source: Ambulatory Visit | Attending: Family | Admitting: Family

## 2019-08-13 ENCOUNTER — Other Ambulatory Visit: Payer: Self-pay

## 2019-08-13 VITALS — BP 157/68 | HR 69 | Temp 98.0°F | Resp 20 | Ht 70.0 in | Wt 181.0 lb

## 2019-08-13 DIAGNOSIS — I6523 Occlusion and stenosis of bilateral carotid arteries: Secondary | ICD-10-CM

## 2019-08-13 NOTE — Progress Notes (Signed)
[facility]ist [facility]  Patient name: [name] MRN: [id number] DOB: February 19, 1936 Sex: male  REASON FOR CONSULT: Bilateral carotid stenosis  HPI: [name] is a 83 y.o. male, who is here today for discussion of bilateral carotid artery stenosis.  He is here with his wife who gives the majority of the history.  He has multiple medical problems.  He was in his usual state of health when in [place] September he had episode of esophagitis.  He had dehydration and vomiting and had several falls.  He then had a episode of confusion was felt to be related to stroke.  MRI in late September showed right thalamic infarct.  He was admitted to the hospital and then rehab and then eventually home.  Imaging at that time revealed bilateral carotid stenosis by duplex and also by CTA.  CTA suggested up to 80% stenosis in his carotid arteries bilaterally.  He was seen by neurology who felt that the carotid bifurcation lesions were asymptomatic.  He is seeing me today for further discussion.  He is in frail health.  Had coronary artery bypass grafting in the late 1990s.  Has prostate cancer.  Does have disability related to knee arthritis.  His wife feels that he is nearly back to his baseline with some memory loss.  Past Medical History:  Diagnosis Date  . Anxiety   . Arthritis   . Asthma   . Cancer [facility])    prostate  . Chronic kidney disease   . Coronary artery disease   . Diabetes mellitus without complication ([place])   . Gout   . High cholesterol   . HOH (hard of hearing) bilateral  . Hypertension   . PONV (postoperative nausea and vomiting)   . Right thalamic infarction ([place]) 06/17/2019  . Shingles   . Stroke [facility])     Family History  Problem Relation Age of Onset  . Heart disease Mother   . Heart attack Brother   . Heart attack Paternal Grandfather   . Anesthesia problems Neg Hx   . Hypotension Neg Hx   . Malignant hyperthermia  Neg Hx   . Pseudochol deficiency Neg Hx     SOCIAL HISTORY: Social History   Socioeconomic History  . Marital status: Married    Spouse name: [name]  . Number of children: 1  . Years of education: 11  . Highest education level: Not on file  Occupational History  . Not on file  Social Needs  . Financial resource strain: Not on file  . Food insecurity    Worry: Not on file    Inability: Not on file  . Transportation needs    Medical: Not on file    Non-medical: Not on file  Tobacco Use  . Smoking status: Never Smoker  . Smokeless tobacco: Never Used  Substance and Sexual Activity  . Alcohol use: No  . Drug use: No  . Sexual activity: Yes  Lifestyle  . Physical activity    Days per week: Not on file    Minutes per session: Not on file  . Stress: Not on file  Relationships  . Social Herbalist on phone: Not on file    Gets together: Not on file    Attends religious service: Not on file    Active member of club or organization: Not on file    Attends meetings of clubs or organizations: Not on file    Relationship  status: Not on file  . Intimate partner violence    Fear of current or ex partner: Not on file    Emotionally abused: Not on file    Physically abused: Not on file    Forced sexual activity: Not on file  Other Topics Concern  . Not on file  Social History Narrative   Lives with wife   No caffeine    Allergies  Allergen Reactions  . Nsaids Nausea And Vomiting    Issues with gallbladder    Current Outpatient Medications  Medication Sig Dispense Refill  . acetaminophen (TYLENOL) 325 MG tablet Take 2 tablets (650 mg total) by mouth every 4 (four) hours as needed for mild pain (or temp > 37.5 C (99.5 F)).    [name] albuterol (VENTOLIN HFA) 108 (90 Base) MCG/ACT inhaler Inhale 1-2 puffs into the lungs every 6 (six) hours as needed for wheezing or shortness of breath. 6.7 g 1  . allopurinol (ZYLOPRIM) 300 MG tablet Take 1 tablet (300 mg total) by  mouth at bedtime. 30 tablet 0  . amLODipine (NORVASC) 5 MG tablet Take 1 tablet (5 mg total) by mouth every morning. 30 tablet 0  . atenolol (TENORMIN) 100 MG tablet Take 100 mg by mouth daily.    [name] atorvastatin (LIPITOR) 40 MG tablet Take 0.5 tablets (20 mg total) by mouth at bedtime. 30 tablet 0  . blood glucose meter kit and supplies KIT Dispense based on patient and insurance preference. Use up to four times daily as directed. (FOR ICD-9 250.00, 250.01). 1 each 0  . cholecalciferol (VITAMIN D3) 25 MCG (1000 UT) tablet Take 1 tablet (1,000 Units total) by mouth every evening. 30 tablet 0  . clopidogrel (PLAVIX) 75 MG tablet Take 1 tablet (75 mg total) by mouth daily. 30 tablet 0  . famotidine (PEPCID) 20 MG tablet Take 1 tablet (20 mg total) by mouth daily. 30 tablet 0  . furosemide (LASIX) 20 MG tablet Take 20 mg by mouth every morning. *May take as needed for fluid retention    . glipiZIDE (GLUCOTROL) 5 MG tablet Take 1 tablet (5 mg total) by mouth every morning. 30 tablet 1  . loratadine (CLARITIN) 10 MG tablet Take 10 mg by mouth daily.    . Lutein 20 MG CAPS Take 1 capsule by mouth every morning.     . theophylline (THEODUR) 300 MG 12 hr tablet Take 300 mg by mouth 2 (two) times daily.    . vitamin B-12 (CYANOCOBALAMIN) 250 MCG tablet Take 250 mcg by mouth every evening.    . vitamin C (ASCORBIC ACID) 500 MG tablet Take 500 mg by mouth every evening.    [name] VITAMIN E COMPLEX PO Take 1 capsule by mouth every evening.     No current facility-administered medications for this visit.    Facility-Administered Medications Ordered in Other Visits  Medication Dose Route Frequency Provider Last Rate Last Dose  . fentaNYL (SUBLIMAZE) injection 25-50 mcg  25-50 mcg Intravenous Q5 min PRN Lerry Liner, MD        REVIEW OF SYSTEMS:  '[X]'$  denotes positive finding, '[ ]'$  denotes negative finding Cardiac  Comments:  Chest pain or chest pressure:    Shortness of breath upon exertion:    Short of  breath when lying flat:    Irregular heart rhythm:        Vascular    Pain in calf, thigh, or hip brought on by ambulation:    Pain in feet at  night that wakes you up from your sleep:     Blood clot in your veins:    Leg swelling:         Pulmonary    Oxygen at home:    Productive cough:     Wheezing:  x       Neurologic    Sudden weakness in arms or legs:     Sudden numbness in arms or legs:     Sudden onset of difficulty speaking or slurred speech:    Temporary loss of vision in one eye:     Problems with dizziness:         Gastrointestinal    Blood in stool:     Vomited blood:         Genitourinary    Burning when urinating:     Blood in urine:        Psychiatric    Major depression:         Hematologic    Bleeding problems:    Problems with blood clotting too easily:        Skin    Rashes or ulcers:        Constitutional    Fever or chills:      PHYSICAL EXAM: Vitals:   08/13/19 1347 08/13/19 1350  BP: (!) 159/75 (!) 157/68  Pulse: 69   Resp: 20   Temp: 98 F (36.7 C)   SpO2: 99%   Weight: 181 lb (82.1 kg)   Height: '5\' 10"'$  (1.778 m)     GENERAL: The patient is a well-nourished male, in no acute distress. The vital signs are documented above. CARDIOVASCULAR: Carotid arteries without bruits bilaterally.  Radial pulses 2+ bilaterally PULMONARY: There is good air exchange  ABDOMEN: Soft and non-tender  MUSCULOSKELETAL: There are no major deformities or cyanosis. NEUROLOGIC: No focal weakness or paresthesias are detected. SKIN: There are no ulcers or rashes noted. PSYCHIATRIC: The patient has a normal affect.  DATA:  CT images were reviewed.  This shows extensive calcification.  He does have what appear to be 80% right common carotid artery stenosis and up to 80% left internal carotid artery stenosis with extremely calcified lesions.  He underwent repeat duplex imaging in our office today.  By our velocity criteria, he has 60 to 79% carotid stenosis  bilaterally in his internal carotid artery  MEDICAL ISSUES: I had extensive conversation with the patient and his wife present.  I do agree that he has asymptomatic carotid stenosis.  He certainly is at significantly increased risk due to his frailty and other multiple medical problems for any type of surgery.  I did explain that calcified lesions can lead to over estimation of the degree of stenosis and CT angiogram.  He is below threshold of where we would recommend endarterectomy based on asymptomatic disease from duplex standpoint.  I would recommend continued close follow-up and will see him again in 6 months with repeat carotid duplex.  If he had symptoms that was felt to be related to carotid distribution or significant progression, we would discuss endarterectomy.   Rosetta Posner, MD FACS [facility] Tel [phone] Pager [phone]

## 2019-08-21 DIAGNOSIS — E1129 Type 2 diabetes mellitus with other diabetic kidney complication: Secondary | ICD-10-CM | POA: Diagnosis not present

## 2019-08-21 DIAGNOSIS — N2581 Secondary hyperparathyroidism of renal origin: Secondary | ICD-10-CM | POA: Diagnosis not present

## 2019-08-21 DIAGNOSIS — M1991 Primary osteoarthritis, unspecified site: Secondary | ICD-10-CM | POA: Diagnosis not present

## 2019-08-21 DIAGNOSIS — L84 Corns and callosities: Secondary | ICD-10-CM | POA: Diagnosis not present

## 2019-08-23 ENCOUNTER — Other Ambulatory Visit (HOSPITAL_COMMUNITY)
Admission: RE | Admit: 2019-08-23 | Discharge: 2019-08-23 | Disposition: A | Discharge status: Home / Self Care | Payer: Medicare Other | Source: Other Acute Inpatient Hospital | Attending: Urology | Admitting: Urology

## 2019-08-23 ENCOUNTER — Other Ambulatory Visit: Payer: Self-pay

## 2019-08-23 ENCOUNTER — Ambulatory Visit: Payer: Medicare Other | Admitting: Urology

## 2019-08-23 DIAGNOSIS — Z87440 Personal history of urinary (tract) infections: Secondary | ICD-10-CM | POA: Diagnosis not present

## 2019-08-23 DIAGNOSIS — C61 Malignant neoplasm of prostate: Secondary | ICD-10-CM

## 2019-08-23 DIAGNOSIS — R351 Nocturia: Secondary | ICD-10-CM

## 2019-08-23 DIAGNOSIS — R972 Elevated prostate specific antigen [PSA]: Secondary | ICD-10-CM

## 2019-08-23 DIAGNOSIS — N401 Enlarged prostate with lower urinary tract symptoms: Secondary | ICD-10-CM

## 2019-08-23 LAB — URINALYSIS, COMPLETE (UACMP) WITH MICROSCOPIC
Bilirubin Urine: NEGATIVE
Glucose, UA: NEGATIVE mg/dL
Hgb urine dipstick: NEGATIVE
Ketones, ur: NEGATIVE mg/dL
Nitrite: NEGATIVE
Protein, ur: NEGATIVE mg/dL
Specific Gravity, Urine: 1.013 (ref 1.005–1.030)
pH: 5 (ref 5.0–8.0)

## 2019-08-25 LAB — URINE CULTURE: Culture: >=100000 — AB

## 2019-08-27 DIAGNOSIS — I639 Cerebral infarction, unspecified: Secondary | ICD-10-CM | POA: Diagnosis not present

## 2019-09-03 ENCOUNTER — Other Ambulatory Visit: Payer: Self-pay | Admitting: *Deleted

## 2019-09-03 DIAGNOSIS — I6523 Occlusion and stenosis of bilateral carotid arteries: Secondary | ICD-10-CM

## 2019-09-11 ENCOUNTER — Encounter: Payer: Self-pay | Admitting: Physical Medicine & Rehabilitation

## 2019-09-11 ENCOUNTER — Encounter: Payer: Medicare Other | Attending: Physical Medicine & Rehabilitation | Admitting: Physical Medicine & Rehabilitation

## 2019-09-11 ENCOUNTER — Other Ambulatory Visit: Payer: Self-pay

## 2019-09-11 DIAGNOSIS — M1712 Unilateral primary osteoarthritis, left knee: Secondary | ICD-10-CM | POA: Diagnosis not present

## 2019-09-11 DIAGNOSIS — I639 Cerebral infarction, unspecified: Secondary | ICD-10-CM | POA: Diagnosis not present

## 2019-09-11 NOTE — Progress Notes (Signed)
Subjective:    Patient ID: [name], male    DOB: 23-Sep-1935, 83 y.o.   MRN: [id number]  HPI   Mr Fichera is here in follow up of his thalamic he had nice results with the knee injection we performed in October.  He has completed home health therapies.  He and wife are working on a home exercise program at home.  Wife is concerned that he tends to lean forward quite a bit over his walker and is hesitant to put weight on his left side.  The injection helped his knee quite a bit but pain is begun to resurface over the last couple weeks in particular.  He does wear a knee sleeve with copper lining which does seem to give him some relief.  Otherwise patient has made gains.  He does need some assistance still with basic self-care and for gait as well as safety within the house.  Nothing new medically has taken place since I last saw the patient.   Pain Inventory Average Pain 5 Pain Right Now 8 My pain is sharp and stabbing  In the last 24 hours, has pain interfered with the following? General activity 6 Relation with others 8 Enjoyment of life 5 What TIME of day is your pain at its worst? daytime Sleep (in general) Good  Pain is worse with: walking and standing Pain improves with: rest, pacing activities, medication and injections Relief from Meds: 5  Mobility walk with assistance use a walker how many minutes can you walk? 5-10 ability to climb steps?  yes do you drive?  yes  Function retired  Neuro/Psych trouble walking  Prior Studies Any changes since last visit?  no  Physicians involved in your care Any changes since last visit?  no   Family History  Problem Relation Age of Onset  . Heart disease Mother   . Heart attack Brother   . Heart attack Paternal Grandfather   . Anesthesia problems Neg Hx   . Hypotension Neg Hx   . Malignant hyperthermia Neg Hx   . Pseudochol deficiency Neg Hx    Social History   Socioeconomic History  . Marital status:  Married    Spouse name: [name]  . Number of children: 1  . Years of education: 66  . Highest education level: Not on file  Occupational History  . Not on file  Tobacco Use  . Smoking status: Never Smoker  . Smokeless tobacco: Never Used  Substance and Sexual Activity  . Alcohol use: No  . Drug use: No  . Sexual activity: Yes  Other Topics Concern  . Not on file  Social History Narrative   Lives with wife   No caffeine   Social Determinants of Health   Financial Resource Strain:   . Difficulty of Paying Living Expenses: Not on file  Food Insecurity:   . Worried About Charity fundraiser in the Last Year: Not on file  . Ran Out of Food in the Last Year: Not on file  Transportation Needs:   . Lack of Transportation (Medical): Not on file  . Lack of Transportation (Non-Medical): Not on file  Physical Activity:   . Days of Exercise per Week: Not on file  . Minutes of Exercise per Session: Not on file  Stress:   . Feeling of Stress : Not on file  Social Connections:   . Frequency of Communication with Friends and Family: Not on file  . Frequency of Social Gatherings  with Friends and Family: Not on file  . Attends Religious Services: Not on file  . Active Member of Clubs or Organizations: Not on file  . Attends Archivist Meetings: Not on file  . Marital Status: Not on file   Past Surgical History:  Procedure Laterality Date  . CATARACT EXTRACTION W/PHACO  11/28/2011   Procedure: CATARACT EXTRACTION PHACO AND INTRAOCULAR LENS PLACEMENT (I[facility]);  Surgeon: Williams Che, MD;  Location: [facility];  Service: Ophthalmology;  Laterality: Right;  CDE 3.84  . CORONARY ARTERY BYPASS GRAFT  1997   6 vessels  . EYE SURGERY  1995   left eye cataract removal   . TOTAL KNEE  PROSTHESIS REMOVAL W/ SPACER INSERTION  8 yrs ago   right   Past Medical History:  Diagnosis Date  . Anxiety   . Arthritis   . Asthma   . Cancer [facility])    prostate  . Chronic kidney disease   .  Coronary artery disease   . Diabetes mellitus without complication ([place])   . Gout   . High cholesterol   . HOH (hard of hearing) bilateral  . Hypertension   . PONV (postoperative nausea and vomiting)   . Right thalamic infarction ([place]) 06/17/2019  . Shingles   . Stroke ([place])    BP (!) 175/66   Pulse 66   Temp 97.7 F (36.5 C)   Wt 188 lb (85.3 kg)   SpO2 97%   BMI 26.98 kg/m   Opioid Risk Score:   Fall Risk Score:  `1  Depression screen PHQ 2/9  Depression screen PHQ 2/9 08/01/2019  Decreased Interest 0  Down, Depressed, Hopeless 0  PHQ - 2 Score 0  Altered sleeping 1  Tired, decreased energy 0  Change in appetite 0  Feeling bad or failure about yourself  0  Trouble concentrating 0  Moving slowly or fidgety/restless 0  Suicidal thoughts 0  PHQ-9 Score 1    Review of Systems  Constitutional: Negative.   HENT: Positive for hearing loss.   Eyes: Negative.   Respiratory: Negative.   Gastrointestinal: Negative.   Endocrine: Negative.   Genitourinary: Negative.   Musculoskeletal: Positive for arthralgias and gait problem.  Skin: Negative.   Allergic/Immunologic: Negative.   Hematological: Negative.   Psychiatric/Behavioral: Negative.   All other systems reviewed and are negative.      Objective:   Physical Exam General: No acute distress HEENT: EOMI, oral membranes moist Cards: reg rate  Chest: normal effort Abdomen: Soft, NT, ND Skin: dry, intact Extremities: no edema  Skin: intact Neuro: HOH. Chronic left upper extremity weakness. LLE 3-4 out of 5.. With pain inhibition. Wide based gait.   No focal sensory loss left arm and leg. Musculoskeletal:left knee with varus deformity accentuated by standing, effusion, antalgic on that side Psych: pleasant, normal affect       Assessment & Plan:  Medical Problem List and Plan: 1. Left-sided weakness with dysarthria altered mental status secondary to medial right thalamic and right internal capsule  infarction. 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 -HEP. HH completed       -reviewed standing posture and gait mechanics at length today, 2. Antithrombotics: -antiplatelet therapy: plavix 3. Pain Management: hx of right TKA, OA of left knee  -left knee worsening.             -has seen Dorna Leitz.   After informed consent and preparation of the skin with betadine and isopropyl alcohol,  I injected 6mg  (1cc) of celestone and 4cc of 1% lidocaine into the left knee via anterolateral approach. Additionally, aspiration was performed prior to injection. The patient tolerated well, and no complications were encountered. Afterward the area was cleaned and dressed. Post- injection instructions were provided.     -knee strengthening exercises provided   -consider synvisc, zilretta injections 4. Bilateral carotid stenosis. Follow-up vascular surgery   5. Diabetes mellitus with hyperglycemia.             -per primary 10. Essential hypertension. lisiinopril   Norvasc 5mg  Tenormin 50 daily  11. CKD stage III.  12. Hyperlipidemia. Lipitor 13. CAD with CABG 1997. No chest pain or shortness of breath. Continue aspirin and Plavix 14. History of gout. Zyloprim 300 mg daily.  15. Congential L brachial plexopathy with chronic contractures--at baseline--no changes today   Fifteen minutes of face to face patient care time were spent during this visit. All questions were encouraged and answered.  Follow up with me in 3 months .

## 2019-09-11 NOTE — Patient Instructions (Signed)
PLEASE FEEL FREE TO CALL OUR OFFICE WITH ANY PROBLEMS OR QUESTIONS ([phone])      

## 2019-09-27 DIAGNOSIS — I639 Cerebral infarction, unspecified: Secondary | ICD-10-CM | POA: Diagnosis not present

## 2019-10-28 DIAGNOSIS — I639 Cerebral infarction, unspecified: Secondary | ICD-10-CM | POA: Diagnosis not present

## 2019-10-29 ENCOUNTER — Telehealth: Payer: Self-pay | Admitting: Adult Health

## 2019-10-29 NOTE — Telephone Encounter (Signed)
I called wife. And she went over and over that pt has had some changes over that last 3-4 wks. Thinks that wife is nurse, confused. Had bad night last night where he did not think he was at home, could not be reasoned with,but did settle down eventually. See spiders, snakes.  States he is not violetn, his humble, is cooperative most of the time.  Uses walker, had one episode that ended up being sciatic nerve/ caused gait issue.  Better now.  Had episode of kidney infection , cephelexin since seen last.  She did not want to consider going to facility.  She will take care of him at home.  She can not speak of these concerns in front of him.  Wanted to have daughter come back with them.  I relayed our policy once person extra for memory issues.  Phone minutes 22.

## 2019-10-29 NOTE — Telephone Encounter (Signed)
Noted! Thank you

## 2019-10-29 NOTE — Telephone Encounter (Signed)
Patient wife called stating she needs to speak to provider befor thursdays appointment as the patient has changed dramatically over the past two weeks. Patient wife states the patient believes she is a Marine scientist and not his wife and would be unable to speak to provider freely in front of him and would like to touch base before. Patient wife would also like to know if there is any way that her daughter could be in the room with her.    [phone]

## 2019-10-31 ENCOUNTER — Ambulatory Visit: Payer: Medicare Other | Admitting: Adult Health

## 2019-11-21 ENCOUNTER — Telehealth: Payer: Self-pay | Admitting: Adult Health

## 2019-11-21 NOTE — Telephone Encounter (Signed)
Called wife.  Please see updated phone note

## 2019-11-21 NOTE — Telephone Encounter (Signed)
Pt wife has called asking that Jessica,NP(not [name],RN) calls her re: her concerns for pt.  Wife(with great concern) states pt had a very bad day yesterday and she believes it is the early signs of dementia.  Wife repeated that she wants pt seen before current appointment, wife unable to accept appointment today due to concrete being poured and her not having a way out.  Wife declined a VV due to the reception being bad in her area.  Wife states pt wet the bed for the 1st time last night and she repeatedly explained that her daughter is with her now, she is concerned for pt and very much wants him seen by [name], NP as soon as possible.  [name], NP please call pt when able.  Wife asked over and over again that she not be called by a nurse.

## 2019-11-21 NOTE — Telephone Encounter (Signed)
[name]   LB  10:32 AM Note   Pt wife has called asking that [name],NP(not [name],RN) calls her re: her concerns for pt.  Wife(with great concern) states pt had a very bad day yesterday and she believes it is the early signs of dementia.  Wife repeated that she wants pt seen before current appointment, wife unable to accept appointment today due to concrete being poured and her not having a way out.  Wife declined a VV due to the reception being bad in her area.  Wife states pt wet the bed for the 1st time last night and she repeatedly explained that her daughter is with her now, she is concerned for pt and very much wants him seen by [name], NP as soon as possible.  [name], NP please call pt when able.  Wife asked over and over again that she not be called by a nurse.

## 2019-11-21 NOTE — Telephone Encounter (Signed)
Returned wife's phone call as requested due to cognitive concerns.  Prolonged conversation approximately 30 minutes due to cognitive concerns which started mid February.  She did have previously scheduled follow-up visit in February but unfortunately canceled due to weather (see prior phone note 2/16).  She states cognition initially improved but 2 weeks ago, he started again believing his wife was a caretaker and did not recognize her as his wife.  He spoke about his mother and family friends as if they are alive but have passed away.  He will have visual hallucinations such as seeing spiders, snakes, and people who are not there.  He can be paranoid such as someone will break into their home as well as perseverates on different ideas.  He has been having greater difficulty with ambulation and at times she will have to be transfer chair as he is unable to use rolling walker.  At times, he will refuse to do different activities such as standing, walking, bathing or dressing.  She denies behaviors such as aggression, agitation or combativeness.  She states last week was a "good week" as he was aware of who his wife was, ambulating with his rolling walker and less confused.  Yesterday, confusion started to increase where he did not know his wife, did not believe he was in his own home and difficulty using rolling walker.  Last night, wife reports patient was incontinent of urine which has not occurred previously.  She is very concerned regarding underlying cause of drastic change of medication as when he was previously seen in 07/2019 only "normal age-related" memory impairment.  She denies any change in weakness, speech, visual concerns, imbalance, or numbness/tingling.  Due to cancellation of prior visit due to weather, she was rescheduled but not until mid April.  She is requesting sooner visit.  Visit scheduled for Monday 3/15 at 10:45am for further evaluation.  Advise any acute onset of worsening or  stroke/TIA symptoms to call 911 immediately for further evaluation.  She verbalized understanding.

## 2019-11-25 ENCOUNTER — Ambulatory Visit: Payer: Medicare Other | Admitting: Adult Health

## 2019-11-25 ENCOUNTER — Other Ambulatory Visit: Payer: Self-pay

## 2019-11-25 ENCOUNTER — Encounter: Payer: Self-pay | Admitting: Adult Health

## 2019-11-25 ENCOUNTER — Telehealth: Payer: Self-pay | Admitting: Adult Health

## 2019-11-25 VITALS — BP 142/70 | HR 80

## 2019-11-25 DIAGNOSIS — R4189 Other symptoms and signs involving cognitive functions and awareness: Secondary | ICD-10-CM | POA: Diagnosis not present

## 2019-11-25 DIAGNOSIS — I6381 Other cerebral infarction due to occlusion or stenosis of small artery: Secondary | ICD-10-CM

## 2019-11-25 DIAGNOSIS — E785 Hyperlipidemia, unspecified: Secondary | ICD-10-CM

## 2019-11-25 DIAGNOSIS — I6523 Occlusion and stenosis of bilateral carotid arteries: Secondary | ICD-10-CM | POA: Diagnosis not present

## 2019-11-25 DIAGNOSIS — R413 Other amnesia: Secondary | ICD-10-CM | POA: Diagnosis not present

## 2019-11-25 DIAGNOSIS — E538 Deficiency of other specified B group vitamins: Secondary | ICD-10-CM | POA: Diagnosis not present

## 2019-11-25 DIAGNOSIS — I639 Cerebral infarction, unspecified: Secondary | ICD-10-CM

## 2019-11-25 DIAGNOSIS — I10 Essential (primary) hypertension: Secondary | ICD-10-CM

## 2019-11-25 DIAGNOSIS — E1165 Type 2 diabetes mellitus with hyperglycemia: Secondary | ICD-10-CM

## 2019-11-25 NOTE — Telephone Encounter (Signed)
[employer] medicare order sent to GI. No auth they will reach out to the patient to schedule.  

## 2019-11-25 NOTE — Patient Instructions (Signed)
We will check lab work and urine to rule out reversible causes of memory loss  You will be called to schedule MRI brain to look for possible new stroke  Continue clopidogrel 75 mg daily  and lipitor  for secondary stroke prevention  Continue to follow up with PCP regarding cholesterol, blood pressure and diabetes management   Continue to monitor blood pressure at home  Maintain strict control of hypertension with blood pressure goal below 130/90, diabetes with hemoglobin A1c goal below 6.5% and cholesterol with LDL cholesterol (bad cholesterol) goal below 70 mg/dL. I also advised the patient to eat a healthy diet with plenty of whole grains, cereals, fruits and vegetables, exercise regularly and maintain ideal body weight.  Followup in the future with me in 2 months or call earlier if needed       Thank you for coming to see [place] at [facility]. I hope we have been able to provide you high quality care today.  You may receive a patient satisfaction survey over the next few weeks. We would appreciate your feedback and comments so that we may continue to improve ourselves and the health of our patients.

## 2019-11-25 NOTE — Progress Notes (Signed)
[facility] [address] [place]. [place] [address] [phone]       STROKE FOLLOW UP NOTE  [name] Date of Birth:  Apr 30, 1936 Medical Record Number:  [id number]   Reason for visit: Worsening cognition with history of stroke    CHIEF COMPLAINT:  Chief Complaint  Patient presents with  . Follow-up    New room, with wife and daughter. Pt's wife reports a change in behavior. Pt is extremely hard of hearing, cannot hear me to hear MMSE.    HPI:  [name] is a 84 year old Warren who is being seen today by request of wife due to worsening cognition.  He is accompanied by his wife and daughter.  Previously seen in 07/2019 for hospital follow-up after right thalamic/IC stroke in 05/2019 with residual mild LLE weakness and possibly delayed processing but otherwise stable cognition for age.  Wife called on 10/29/2019 due to concerns of worsening memory with follow-up visit scheduled shortly after that time but unfortunately canceled due to weather concerns.  Rescheduled in April but called again on 11/21/2019 due to worsening memory (see phone note).  Of summary, he has been having waxing/waning of cognition with episodes of confusion typically worse in the evening that severely worsened in the middle of last week.  He had difficulty ambulating, consistently confused, unable to identify wife, required greater assistance with ADLs and incontinent of urine.  Per wife and daughter, acute worsening mid February gradually improving with only occasional confusion and again severe worsening mid last week.  Daughter also states somewhere between Christmas and mid January, they noticed greater difficulty with fully picking up right leg with increased weakness while ambulating.  Wife does note increased confusion greater after taking a nap.  Over the weekend, wife and daughter both endorse great improvement and only has occasional disorientation to location.  Also endorses  improvement of ambulation and has been slowly increasing distance with use of rolling walker.  He was previously treated for UTI with Keflex in 08/2019 by urologist.  He does have consistent urinary difficulties due to prostate issues.  Continues on clopidogrel and atorvastatin for secondary stroke prevention without side effects.  Blood pressure not routinely monitored at home with today's level 142/70.  He was evaluated by vascular surgery Dr. Donnetta Hutching for bilateral carotid stenosis and recommended ongoing medication management for asymptomatic disease and repeat carotid duplex in 6 months (around 02/2020).  Wife does endorse satisfactory intake of fluid and nutrition.  Denies behavioral concerns or depression/anxiety.  Denies speech concerns, numbness/tingling, imbalance or visual concerns.      History copied for reference purposes only Stroke admission 06/02/2019: [name] a 85 y.o.malewith history of DB, CAD, HTN who was seen at [facility]/04/2019 following a fall and was discharged home with diagnosis of esophagitis.  He presented again to [facility] 06/10/2019 withdifficulty speaking and confusion and was transferred to [facility] for further evaluation.   Stroke work-up showed right thalamic/internal capsule infarct in setting of intracranial arthrosclerosis and small vessel disease with recent dehydration following episode of abdominal pain (admission on 06/04/2019).  In addition to acute infarct, MRI also showed right basal ganglia and left occipital cortex infarcts.  CTA head/neck showed occluded right PCA, left evaluation of severe stenosis at origin, distal right CCA 80%, proximal right ICA 60% stenosis, proximal left ICA 80% stenosis, moderate right ICA and mild left ICA intracranial atherosclerosis as well as aortic arthrosclerosis.  Carotid Doppler showed bilateral ICA 60  to 79% stenosis.  2D echo showed normal EF without cardiac source of embolus.  LDL 66.  A1c  5.8.  Recommended DAPT for 3 weeks and then Plavix alone.  Carotid revascularization not indicated at this time due to general poor medical status and frequent infarcts likely due to small vessel disease but may consider elective referral to vascular surgery outpatient for further evaluation.  He was discharged to [facility] for ongoing therapies.  Initial visit 08/01/2019: [name] is a 84 year old Warren who is being seen today for stroke follow-up accompanied by his wife.  Greatly recovered from a stroke standpoint with mild LLE weakness and only occasional short-term memory impairment but wife believes more age-related.  He also has occasional "delayed processing" of information but also potentially related to hearing loss.  Continues to use rolling walker on occasion due to residual mild LLE weakness along with chronic left knee pain.  He did recently receive an injection by physical medicine and rehab Dr. Naaman Plummer with great benefit.  He has since completed home therapy but continues to do exercises daily.  He has continued on DAPT without bleeding or bruising.  Continues on atorvastatin without myalgias.  Blood pressure today initially elevated but on repeat 138/58.  He does have initial evaluation with vascular surgery Dr. Donnetta Hutching on 08/13/2019.  Wife does endorse increased anxiety but was previously on Xanax as needed which was discontinued at hospital discharge.  She questions if this can be restarted.  Denies new or worsening stroke/TIA symptoms.    ROS:   14 system review of systems performed and negative with exception of pain, confusion, memory loss, weakness, hearing impairment  PMH:  Past Medical History:  Diagnosis Date  . Anxiety   . Arthritis   . Asthma   . Cancer [facility])    prostate  . Chronic kidney disease   . Coronary artery disease   . Diabetes mellitus without complication ([place])   . Gout   . High cholesterol   . HOH (hard of hearing) bilateral  . Hypertension   . PONV  (postoperative nausea and vomiting)   . Right thalamic infarction ([place]) 06/17/2019  . Shingles   . Stroke [facility])     PSH:  Past Surgical History:  Procedure Laterality Date  . CATARACT EXTRACTION W/PHACO  11/28/2011   Procedure: CATARACT EXTRACTION PHACO AND INTRAOCULAR LENS PLACEMENT (IOC);  Surgeon: Williams Che, MD;  Location: [facility];  Service: Ophthalmology;  Laterality: Right;  CDE 3.84  . CORONARY ARTERY BYPASS GRAFT  1997   6 vessels  . EYE SURGERY  1995   left eye cataract removal   . TOTAL KNEE  PROSTHESIS REMOVAL W/ SPACER INSERTION  8 yrs ago   right    Social History:  Social History   Socioeconomic History  . Marital status: Married    Spouse name: [name]  . Number of children: 1  . Years of education: 34  . Highest education level: Not on file  Occupational History  . Not on file  Tobacco Use  . Smoking status: Never Smoker  . Smokeless tobacco: Never Used  Substance and Sexual Activity  . Alcohol use: No  . Drug use: No  . Sexual activity: Yes  Other Topics Concern  . Not on file  Social History Narrative   Lives with wife   No caffeine   Social Determinants of Health   Financial Resource Strain:   . Difficulty of Paying Living Expenses:   Food Insecurity:   .  Worried About Charity fundraiser in the Last Year:   . Arboriculturist in the Last Year:   Transportation Needs:   . Film/video editor (Medical):   [name] Lack of Transportation (Non-Medical):   Physical Activity:   . Days of Exercise per Week:   . Minutes of Exercise per Session:   Stress:   . Feeling of Stress :   Social Connections:   . Frequency of Communication with Friends and Family:   . Frequency of Social Gatherings with Friends and Family:   . Attends Religious Services:   . Active Member of Clubs or Organizations:   . Attends Archivist Meetings:   [name] Marital Status:   Intimate Partner Violence:   . Fear of Current or Ex-Partner:   . Emotionally Abused:   [name]  Physically Abused:   . Sexually Abused:     Family History:  Family History  Problem Relation Age of Onset  . Heart disease Mother   . Heart attack Brother   . Heart attack Paternal Grandfather   . Anesthesia problems Neg Hx   . Hypotension Neg Hx   . Malignant hyperthermia Neg Hx   . Pseudochol deficiency Neg Hx     Medications:   Current Outpatient Medications on File Prior to Visit  Medication Sig Dispense Refill  . acetaminophen (TYLENOL) 325 MG tablet Take 2 tablets (650 mg total) by mouth every 4 (four) hours as needed for mild pain (or temp > 37.5 C (99.5 F)).    [name] albuterol (VENTOLIN HFA) 108 (90 Base) MCG/ACT inhaler Inhale 1-2 puffs into the lungs every 6 (six) hours as needed for wheezing or shortness of breath. 6.7 g 1  . allopurinol (ZYLOPRIM) 300 MG tablet Take 1 tablet (300 mg total) by mouth at bedtime. 30 tablet 0  . amLODipine (NORVASC) 5 MG tablet Take 1 tablet (5 mg total) by mouth every morning. 30 tablet 0  . atenolol (TENORMIN) 100 MG tablet Take 50 mg by mouth daily.     [name] atorvastatin (LIPITOR) 40 MG tablet Take 0.5 tablets (20 mg total) by mouth at bedtime. 30 tablet 0  . blood glucose meter kit and supplies KIT Dispense based on patient and insurance preference. Use up to four times daily as directed. (FOR ICD-9 250.00, 250.01). 1 each 0  . cholecalciferol (VITAMIN D3) 25 MCG (1000 UT) tablet Take 1 tablet (1,000 Units total) by mouth every evening. 30 tablet 0  . clopidogrel (PLAVIX) 75 MG tablet Take 1 tablet (75 mg total) by mouth daily. 30 tablet 0  . famotidine (PEPCID) 20 MG tablet Take 1 tablet (20 mg total) by mouth daily. 30 tablet 0  . furosemide (LASIX) 20 MG tablet Take 20 mg by mouth every morning. *May take as needed for fluid retention    . glipiZIDE (GLUCOTROL) 5 MG tablet Take 1 tablet (5 mg total) by mouth every morning. 30 tablet 1  . loratadine (CLARITIN) 10 MG tablet Take 10 mg by mouth daily.    . Lutein 20 MG CAPS Take 1 capsule by  mouth every morning.     . theophylline (THEODUR) 300 MG 12 hr tablet Take 300 mg by mouth 2 (two) times daily.    . vitamin B-12 (CYANOCOBALAMIN) 250 MCG tablet Take 250 mcg by mouth every evening.    . vitamin C (ASCORBIC ACID) 500 MG tablet Take 500 mg by mouth every evening.    [name] VITAMIN E COMPLEX PO Take  1 capsule by mouth every evening.     Current Facility-Administered Medications on File Prior to Visit  Medication Dose Route Frequency Provider Last Rate Last Admin  . fentaNYL (SUBLIMAZE) injection 25-50 mcg  25-50 mcg Intravenous Q5 min PRN Lerry Liner, MD        Allergies:   Allergies  Allergen Reactions  . Nsaids Nausea And Vomiting    Issues with gallbladder     Physical Exam  Vitals:   11/25/19 1037  BP: (!) 142/70  Pulse: 80   There is no height or weight on file to calculate BMI. No exam data present   General: well developed, well nourished,  pleasant elderly Caucasian Warren, seated, in no evident distress Head: head normocephalic and atraumatic.   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 Disoriented to place and time. Recent memory diminished and remote memory occassionally diminished. Attention span, concentration and fund of knowledge appropriate during todays visit. Mood and affect appropriate.  Unable to obtain MMSE due to hearing Cranial Nerves: Fundoscopic exam reveals sharp disc margins. Pupils equal, briskly reactive to light. Extraocular movements full without nystagmus. Visual fields full to confrontation.  Severely hard of hearing with bilateral hearing aids. Facial sensation intact. Right lower facial droop - change from prior Motor: Normal bulk and tone LUE: Chronic weakness from arm deformity - patient reports  worsening after prior stroke LLE: 5/5 RUE: 5/5 with positive pronator drift - change from prior visit RLE: 5/5 Sensory.: intact to touch , pinprick , position and vibratory sensation.  Coordination: Rapid alternating movements normal in all extremities except decreased right hand -change from prior visit. Finger-to-nose performed accurately on right side and heel-to-shin performed accurately bilaterally. Gait and Station: Deferred as currently in wheelchair and greater difficulty ambulating Reflexes: 1+ and symmetric. Toes downgoing.         Diagnostic Data (Labs, Imaging, Testing)  CT HEAD WO CONTRAST 06/10/2019 IMPRESSION: 1. No evidence of acute intracranial abnormality.  ASPECTS of 10. 2. Chronic small vessel ischemia with small chronic infarcts as above.  CT ANGIO HEAD W OR WO CONTRAST CT ANGIO NECK W OR WO CONTRAST CT CEREBRAL PERFUSION W CONTRAST 06/10/2019 IMPRESSION: 1. Occluded right vertebral artery. 2. Patent left vertebral artery with moderate to severe stenosis of its origin. 3. 80% distal right common carotid artery stenosis and 60% proximal right ICA stenosis. 4. 80% proximal left ICA stenosis. 5. Moderate right and mild left intracranial ICA stenoses. 6. Negative CTP. 7.  Aortic Atherosclerosis (ICD10-I70.0).  MR BRAIN WO CONTRAST 06/11/2019 IMPRESSION: 1. Small acute infarcts at the medial right thalamus and right internal capsule. 2. Chronic ischemic injury including remote right basal ganglia infarct and left occipital cortex infarct.    ASSESSMENT: [name] is a 84 y.o. year old Warren presented with slurred speech and confusion on 06/10/2019 with stroke work-up showing right thalamus and right internal capsule infarcts secondary to small vessel disease. Vascular risk factors include HTN, HLD, DM, prior strokes and extensive intracranial and extracranial stenosis.  Since prior visit, has had new onset right-sided weakness in January and had  acute onset confusion with cognitive decline in February slowly improving and then drastic decline last week. Ddx acute stroke vs UTI vs vascular cognitive impairment.  Unable to obtain MMSE due to severe hearing impairment  PLAN:  1. Worsening cognition with confusion: We will obtain lab work including CBC, BMP, TSH, homocysteine and B12 as well as UA to rule out reversible causes of memory concerns. Will also obtain MRI brain wo contrast to rule out a new stroke (acute onset memory concerns and right sided weakness new from prior visit) vs other abnormality -unable to do imaging with contrast due to CKD. Based on above work up, will obtain EEG if all unremarkable.  Discussion regarding benefit with participation in therapies but wife declines at this time as she works with him daily performing same type of exercises that were done with prior home health therapy.  Advised any severe worsening or new stroke/TIA symptoms to call 911 immediately for further evaluation.  Wife had multiple questions and concerns during today's prolonged visit which were all answered to satisfaction. 2. Right thalamus/internal capsule stroke: Continue clopidogrel 75 mg daily  and atorvastatin for secondary stroke prevention.  Maintain strict control of hypertension with blood pressure goal below 130/90, diabetes with hemoglobin A1c goal below 6.5% and cholesterol with LDL cholesterol (bad cholesterol) goal below 70 mg/dL.  I also advised the patient to eat a healthy diet with plenty of whole grains, cereals, fruits and vegetables, exercise regularly with at least 30 minutes of continuous activity daily and maintain ideal body weight. 3. HTN: Advised to continue current treatment regimen.  Advised to continue to monitor at home along with continued follow-up with PCP for management.  Advised to avoid hypotension due to intra and extracranial stenosis 4. HLD: Advised to continue current treatment regimen along with continued  follow-up with PCP for future prescribing and monitoring of lipid panel 5. DMII: Advised to continue to monitor glucose levels at home along with continued follow-up with PCP for management and monitoring 6. Occluded right VA, bilateral carotid stenosis: Evaluated by vascular surgery and recommended repeating carotid ultrasound around 02/2020.  If new stroke found that could be caused by carotid stenosis, will follow up with vascular surgery for possible need of intervention at that time   Follow up in 2 months or call earlier if needed   I spent 60 minutes of face-to-face and non-face-to-face time with patient, wife and daughter.  This included previsit chart review, lab review, study review, order entry, electronic health record documentation, patient education    Frann Rider, [facility]  [facility] [address] [place], [place] [address]  Phone [phone] Fax [phone] Note: This document was prepared with digital dictation and possible smart phrase technology. Any transcriptional errors that result from this process are unintentional.

## 2019-11-26 LAB — URINALYSIS, ROUTINE W REFLEX MICROSCOPIC
Bilirubin, UA: NEGATIVE
Glucose, UA: NEGATIVE
Ketones, UA: NEGATIVE
Leukocytes,UA: NEGATIVE
Nitrite, UA: NEGATIVE
RBC, UA: NEGATIVE
Specific Gravity, UA: 1.019 (ref 1.005–1.030)
Urobilinogen, Ur: 0.2 mg/dL (ref 0.2–1.0)
pH, UA: 5.5 (ref 5.0–7.5)

## 2019-11-26 LAB — HOMOCYSTEINE: Homocysteine: 15.8 umol/L (ref 0.0–21.3)

## 2019-11-26 LAB — MICROSCOPIC EXAMINATION
Bacteria, UA: NONE SEEN
Casts: NONE SEEN /lpf
Epithelial Cells (non renal): NONE SEEN /hpf (ref 0–10)
WBC, UA: NONE SEEN /hpf (ref 0–5)

## 2019-11-26 LAB — BASIC METABOLIC PANEL
BUN/Creatinine Ratio: 23 (ref 10–24)
BUN: 50 mg/dL — ABNORMAL HIGH (ref 8–27)
CO2: 19 mmol/L — ABNORMAL LOW (ref 20–29)
Calcium: 10 mg/dL (ref 8.6–10.2)
Chloride: 105 mmol/L (ref 96–106)
Creatinine, Ser: 2.18 mg/dL — ABNORMAL HIGH (ref 0.76–1.27)
GFR calc Af Amer: 32 mL/min/{1.73_m2} — ABNORMAL LOW (ref >59)
GFR calc non Af Amer: 27 mL/min/{1.73_m2} — ABNORMAL LOW (ref >59)
Glucose: 115 mg/dL — ABNORMAL HIGH (ref 65–99)
Potassium: 4.4 mmol/L (ref 3.5–5.2)
Sodium: 142 mmol/L (ref 134–144)

## 2019-11-26 LAB — CBC WITH DIFFERENTIAL/PLATELET
Basophils Absolute: 0.1 10*3/uL (ref 0.0–0.2)
Basos: 1 %
EOS (ABSOLUTE): 0.4 10*3/uL (ref 0.0–0.4)
Eos: 5 %
Hematocrit: 36.8 % — ABNORMAL LOW (ref 37.5–51.0)
Hemoglobin: 12 g/dL — ABNORMAL LOW (ref 13.0–17.7)
Immature Grans (Abs): 0 10*3/uL (ref 0.0–0.1)
Immature Granulocytes: 0 %
Lymphocytes Absolute: 1.5 10*3/uL (ref 0.7–3.1)
Lymphs: 21 %
MCH: 28 pg (ref 26.6–33.0)
MCHC: 32.6 g/dL (ref 31.5–35.7)
MCV: 86 fL (ref 79–97)
Monocytes Absolute: 0.6 10*3/uL (ref 0.1–0.9)
Monocytes: 8 %
Neutrophils Absolute: 4.6 10*3/uL (ref 1.4–7.0)
Neutrophils: 65 %
Platelets: 306 10*3/uL (ref 150–450)
RBC: 4.29 x10E6/uL (ref 4.14–5.80)
RDW: 15.7 % — ABNORMAL HIGH (ref 11.6–15.4)
WBC: 7.1 10*3/uL (ref 3.4–10.8)

## 2019-11-26 LAB — TSH: TSH: 3.48 u[IU]/mL (ref 0.450–4.500)

## 2019-11-26 LAB — VITAMIN B12: Vitamin B-12: 1331 pg/mL — ABNORMAL HIGH (ref 232–1245)

## 2019-11-27 ENCOUNTER — Telehealth: Payer: Self-pay | Admitting: *Deleted

## 2019-11-27 NOTE — Telephone Encounter (Signed)
Spoke with wife,  Advised the patient's  recent lab work is all satisfactory. She asked about urine infection; I advised only trace bacteria, so no antibiotic needed. She verbalized understanding, appreciation.

## 2019-12-08 ENCOUNTER — Emergency Department (HOSPITAL_COMMUNITY): Payer: Medicare Other

## 2019-12-08 ENCOUNTER — Inpatient Hospital Stay (HOSPITAL_COMMUNITY)
Admission: EM | Admit: 2019-12-08 | Discharge: 2019-12-13 | DRG: 056 | Disposition: A | Discharge status: Skilled Nursing Facility | Payer: Medicare Other | Attending: Student | Admitting: Student

## 2019-12-08 DIAGNOSIS — Z20822 Contact with and (suspected) exposure to covid-19: Secondary | ICD-10-CM | POA: Diagnosis present

## 2019-12-08 DIAGNOSIS — E7800 Pure hypercholesterolemia, unspecified: Secondary | ICD-10-CM | POA: Diagnosis present

## 2019-12-08 DIAGNOSIS — N183 Chronic kidney disease, stage 3 unspecified: Secondary | ICD-10-CM | POA: Diagnosis present

## 2019-12-08 DIAGNOSIS — N1832 Chronic kidney disease, stage 3b: Secondary | ICD-10-CM | POA: Diagnosis not present

## 2019-12-08 DIAGNOSIS — I129 Hypertensive chronic kidney disease with stage 1 through stage 4 chronic kidney disease, or unspecified chronic kidney disease: Secondary | ICD-10-CM | POA: Diagnosis not present

## 2019-12-08 DIAGNOSIS — E1165 Type 2 diabetes mellitus with hyperglycemia: Secondary | ICD-10-CM | POA: Diagnosis not present

## 2019-12-08 DIAGNOSIS — I440 Atrioventricular block, first degree: Secondary | ICD-10-CM | POA: Diagnosis not present

## 2019-12-08 DIAGNOSIS — Z7902 Long term (current) use of antithrombotics/antiplatelets: Secondary | ICD-10-CM | POA: Diagnosis not present

## 2019-12-08 DIAGNOSIS — G3183 Dementia with Lewy bodies: Principal | ICD-10-CM | POA: Diagnosis present

## 2019-12-08 DIAGNOSIS — R1311 Dysphagia, oral phase: Secondary | ICD-10-CM | POA: Diagnosis present

## 2019-12-08 DIAGNOSIS — J90 Pleural effusion, not elsewhere classified: Secondary | ICD-10-CM | POA: Diagnosis not present

## 2019-12-08 DIAGNOSIS — H9193 Unspecified hearing loss, bilateral: Secondary | ICD-10-CM | POA: Diagnosis present

## 2019-12-08 DIAGNOSIS — N184 Chronic kidney disease, stage 4 (severe): Secondary | ICD-10-CM | POA: Diagnosis present

## 2019-12-08 DIAGNOSIS — Z515 Encounter for palliative care: Secondary | ICD-10-CM | POA: Diagnosis not present

## 2019-12-08 DIAGNOSIS — E785 Hyperlipidemia, unspecified: Secondary | ICD-10-CM | POA: Diagnosis present

## 2019-12-08 DIAGNOSIS — Z8249 Family history of ischemic heart disease and other diseases of the circulatory system: Secondary | ICD-10-CM | POA: Diagnosis not present

## 2019-12-08 DIAGNOSIS — I6523 Occlusion and stenosis of bilateral carotid arteries: Secondary | ICD-10-CM | POA: Diagnosis present

## 2019-12-08 DIAGNOSIS — Z743 Need for continuous supervision: Secondary | ICD-10-CM | POA: Diagnosis not present

## 2019-12-08 DIAGNOSIS — R4182 Altered mental status, unspecified: Secondary | ICD-10-CM | POA: Diagnosis present

## 2019-12-08 DIAGNOSIS — J441 Chronic obstructive pulmonary disease with (acute) exacerbation: Secondary | ICD-10-CM | POA: Diagnosis not present

## 2019-12-08 DIAGNOSIS — I2510 Atherosclerotic heart disease of native coronary artery without angina pectoris: Secondary | ICD-10-CM | POA: Diagnosis not present

## 2019-12-08 DIAGNOSIS — M6281 Muscle weakness (generalized): Secondary | ICD-10-CM | POA: Diagnosis not present

## 2019-12-08 DIAGNOSIS — E1122 Type 2 diabetes mellitus with diabetic chronic kidney disease: Secondary | ICD-10-CM | POA: Diagnosis not present

## 2019-12-08 DIAGNOSIS — G9341 Metabolic encephalopathy: Secondary | ICD-10-CM | POA: Diagnosis not present

## 2019-12-08 DIAGNOSIS — Z79899 Other long term (current) drug therapy: Secondary | ICD-10-CM

## 2019-12-08 DIAGNOSIS — Z951 Presence of aortocoronary bypass graft: Secondary | ICD-10-CM | POA: Diagnosis not present

## 2019-12-08 DIAGNOSIS — F419 Anxiety disorder, unspecified: Secondary | ICD-10-CM | POA: Diagnosis present

## 2019-12-08 DIAGNOSIS — Z66 Do not resuscitate: Secondary | ICD-10-CM

## 2019-12-08 DIAGNOSIS — I6930 Unspecified sequelae of cerebral infarction: Secondary | ICD-10-CM | POA: Diagnosis not present

## 2019-12-08 DIAGNOSIS — Z96651 Presence of right artificial knee joint: Secondary | ICD-10-CM | POA: Diagnosis not present

## 2019-12-08 DIAGNOSIS — M2550 Pain in unspecified joint: Secondary | ICD-10-CM | POA: Diagnosis not present

## 2019-12-08 DIAGNOSIS — F03918 Unspecified dementia, unspecified severity, with other behavioral disturbance: Secondary | ICD-10-CM

## 2019-12-08 DIAGNOSIS — F0391 Unspecified dementia with behavioral disturbance: Secondary | ICD-10-CM

## 2019-12-08 DIAGNOSIS — M109 Gout, unspecified: Secondary | ICD-10-CM | POA: Diagnosis present

## 2019-12-08 DIAGNOSIS — R479 Unspecified speech disturbances: Secondary | ICD-10-CM | POA: Diagnosis not present

## 2019-12-08 DIAGNOSIS — G8194 Hemiplegia, unspecified affecting left nondominant side: Secondary | ICD-10-CM | POA: Diagnosis not present

## 2019-12-08 DIAGNOSIS — Z03818 Encounter for observation for suspected exposure to other biological agents ruled out: Secondary | ICD-10-CM | POA: Diagnosis not present

## 2019-12-08 DIAGNOSIS — I10 Essential (primary) hypertension: Secondary | ICD-10-CM | POA: Diagnosis present

## 2019-12-08 DIAGNOSIS — R5381 Other malaise: Secondary | ICD-10-CM | POA: Diagnosis not present

## 2019-12-08 DIAGNOSIS — R627 Adult failure to thrive: Secondary | ICD-10-CM | POA: Diagnosis not present

## 2019-12-08 DIAGNOSIS — E119 Type 2 diabetes mellitus without complications: Secondary | ICD-10-CM | POA: Diagnosis not present

## 2019-12-08 DIAGNOSIS — J9811 Atelectasis: Secondary | ICD-10-CM | POA: Diagnosis not present

## 2019-12-08 DIAGNOSIS — R402421 Glasgow coma scale score 9-12, in the field [EMT or ambulance]: Secondary | ICD-10-CM | POA: Diagnosis not present

## 2019-12-08 DIAGNOSIS — I69354 Hemiplegia and hemiparesis following cerebral infarction affecting left non-dominant side: Secondary | ICD-10-CM | POA: Diagnosis not present

## 2019-12-08 DIAGNOSIS — Z8546 Personal history of malignant neoplasm of prostate: Secondary | ICD-10-CM

## 2019-12-08 DIAGNOSIS — I25810 Atherosclerosis of coronary artery bypass graft(s) without angina pectoris: Secondary | ICD-10-CM | POA: Diagnosis not present

## 2019-12-08 DIAGNOSIS — Z7984 Long term (current) use of oral hypoglycemic drugs: Secondary | ICD-10-CM

## 2019-12-08 DIAGNOSIS — Z886 Allergy status to analgesic agent status: Secondary | ICD-10-CM

## 2019-12-08 DIAGNOSIS — R262 Difficulty in walking, not elsewhere classified: Secondary | ICD-10-CM | POA: Diagnosis not present

## 2019-12-08 DIAGNOSIS — Z7401 Bed confinement status: Secondary | ICD-10-CM | POA: Diagnosis not present

## 2019-12-08 DIAGNOSIS — I679 Cerebrovascular disease, unspecified: Secondary | ICD-10-CM | POA: Diagnosis not present

## 2019-12-08 DIAGNOSIS — I959 Hypotension, unspecified: Secondary | ICD-10-CM | POA: Diagnosis not present

## 2019-12-08 DIAGNOSIS — R404 Transient alteration of awareness: Secondary | ICD-10-CM | POA: Diagnosis not present

## 2019-12-08 DIAGNOSIS — M1A00X0 Idiopathic chronic gout, unspecified site, without tophus (tophi): Secondary | ICD-10-CM | POA: Diagnosis not present

## 2019-12-08 LAB — COMPREHENSIVE METABOLIC PANEL
ALT: 12 U/L (ref 0–44)
AST: 16 U/L (ref 15–41)
Albumin: 3.9 g/dL (ref 3.5–5.0)
Alkaline Phosphatase: 70 U/L (ref 38–126)
Anion gap: 10 (ref 5–15)
BUN: 31 mg/dL — ABNORMAL HIGH (ref 8–23)
CO2: 24 mmol/L (ref 22–32)
Calcium: 10.1 mg/dL (ref 8.9–10.3)
Chloride: 110 mmol/L (ref 98–111)
Creatinine, Ser: 2.13 mg/dL — ABNORMAL HIGH (ref 0.61–1.24)
GFR calc Af Amer: 33 mL/min — ABNORMAL LOW (ref >60)
GFR calc non Af Amer: 28 mL/min — ABNORMAL LOW (ref >60)
Glucose, Bld: 130 mg/dL — ABNORMAL HIGH (ref 70–99)
Potassium: 3.9 mmol/L (ref 3.5–5.1)
Sodium: 144 mmol/L (ref 135–145)
Total Bilirubin: 1 mg/dL (ref 0.3–1.2)
Total Protein: 8 g/dL (ref 6.5–8.1)

## 2019-12-08 LAB — RAPID URINE DRUG SCREEN, HOSP PERFORMED
Amphetamines: NOT DETECTED
Barbiturates: NOT DETECTED
Benzodiazepines: NOT DETECTED
Cocaine: NOT DETECTED
Opiates: NOT DETECTED
Tetrahydrocannabinol: NOT DETECTED

## 2019-12-08 LAB — DIFFERENTIAL
Abs Immature Granulocytes: 0.02 10*3/uL (ref 0.00–0.07)
Basophils Absolute: 0 10*3/uL (ref 0.0–0.1)
Basophils Relative: 1 %
Eosinophils Absolute: 0.2 10*3/uL (ref 0.0–0.5)
Eosinophils Relative: 3 %
Immature Granulocytes: 0 %
Lymphocytes Relative: 16 %
Lymphs Abs: 1.1 10*3/uL (ref 0.7–4.0)
Monocytes Absolute: 0.6 10*3/uL (ref 0.1–1.0)
Monocytes Relative: 9 %
Neutro Abs: 4.9 10*3/uL (ref 1.7–7.7)
Neutrophils Relative %: 71 %

## 2019-12-08 LAB — CBG MONITORING, ED
Glucose-Capillary: 128 mg/dL — ABNORMAL HIGH (ref 70–99)
Glucose-Capillary: 89 mg/dL (ref 70–99)

## 2019-12-08 LAB — CBC
HCT: 40.1 % (ref 39.0–52.0)
Hemoglobin: 12.8 g/dL — ABNORMAL LOW (ref 13.0–17.0)
MCH: 28.6 pg (ref 26.0–34.0)
MCHC: 31.9 g/dL (ref 30.0–36.0)
MCV: 89.7 fL (ref 80.0–100.0)
Platelets: 259 10*3/uL (ref 150–400)
RBC: 4.47 MIL/uL (ref 4.22–5.81)
RDW: 15.3 % (ref 11.5–15.5)
WBC: 6.8 10*3/uL (ref 4.0–10.5)
nRBC: 0 % (ref 0.0–0.2)

## 2019-12-08 LAB — URINALYSIS, ROUTINE W REFLEX MICROSCOPIC
Bacteria, UA: NONE SEEN
Bilirubin Urine: NEGATIVE
Glucose, UA: NEGATIVE mg/dL
Hgb urine dipstick: NEGATIVE
Ketones, ur: NEGATIVE mg/dL
Leukocytes,Ua: NEGATIVE
Nitrite: NEGATIVE
Protein, ur: 100 mg/dL — AB
Specific Gravity, Urine: 1.016 (ref 1.005–1.030)
pH: 6 (ref 5.0–8.0)

## 2019-12-08 LAB — PROTIME-INR
INR: 1.1 (ref 0.8–1.2)
Prothrombin Time: 14 seconds (ref 11.4–15.2)

## 2019-12-08 LAB — GLUCOSE, CAPILLARY: Glucose-Capillary: 82 mg/dL (ref 70–99)

## 2019-12-08 LAB — SARS CORONAVIRUS 2 (TAT 6-24 HRS): SARS Coronavirus 2: NEGATIVE

## 2019-12-08 LAB — APTT: aPTT: 29 seconds (ref 24–36)

## 2019-12-08 LAB — ETHANOL: Alcohol, Ethyl (B): <10 mg/dL (ref <10)

## 2019-12-08 MED ORDER — SENNOSIDES-DOCUSATE SODIUM 8.6-50 MG PO TABS: 1.0000 | ORAL_TABLET | Freq: Every evening | ORAL | Status: DC | PRN

## 2019-12-08 MED ORDER — ACETAMINOPHEN 650 MG RE SUPP: 650.0000 mg | RECTAL | Status: DC | PRN

## 2019-12-08 MED ORDER — ENOXAPARIN SODIUM 40 MG/0.4ML ~~LOC~~ SOLN
40.0000 mg | SUBCUTANEOUS | Status: DC
  Administered 2019-12-09: 40 mg via SUBCUTANEOUS
  Filled 2019-12-08: qty 0.4

## 2019-12-08 MED ORDER — INSULIN ASPART 100 UNIT/ML ~~LOC~~ SOLN
0.0000 [IU] | Freq: Three times a day (TID) | SUBCUTANEOUS | Status: DC
  Administered 2019-12-09: 3 [IU] via SUBCUTANEOUS
  Administered 2019-12-10: 1 [IU] via SUBCUTANEOUS
  Administered 2019-12-10: 5 [IU] via SUBCUTANEOUS
  Administered 2019-12-10 – 2019-12-11 (×2): 2 [IU] via SUBCUTANEOUS
  Administered 2019-12-11: 5 [IU] via SUBCUTANEOUS
  Administered 2019-12-11: 3 [IU] via SUBCUTANEOUS
  Administered 2019-12-12: 13:00:00 2 [IU] via SUBCUTANEOUS
  Administered 2019-12-12: 5 [IU] via SUBCUTANEOUS
  Administered 2019-12-12 – 2019-12-13 (×2): 2 [IU] via SUBCUTANEOUS

## 2019-12-08 MED ORDER — ALBUTEROL SULFATE (2.5 MG/3ML) 0.083% IN NEBU: 2.5000 mg | INHALATION_SOLUTION | Freq: Four times a day (QID) | RESPIRATORY_TRACT | Status: DC | PRN

## 2019-12-08 MED ORDER — ALLOPURINOL 100 MG PO TABS
300.0000 mg | ORAL_TABLET | Freq: Every day | ORAL | Status: DC
  Administered 2019-12-09 – 2019-12-12 (×4): 300 mg via ORAL
  Filled 2019-12-08: qty 3
  Filled 2019-12-08: qty 1
  Filled 2019-12-08 (×3): qty 3

## 2019-12-08 MED ORDER — CLOPIDOGREL BISULFATE 75 MG PO TABS
75.0000 mg | ORAL_TABLET | Freq: Every day | ORAL | Status: DC
  Administered 2019-12-10 – 2019-12-13 (×4): 75 mg via ORAL
  Filled 2019-12-08 (×4): qty 1

## 2019-12-08 MED ORDER — ATORVASTATIN CALCIUM 10 MG PO TABS
20.0000 mg | ORAL_TABLET | Freq: Every day | ORAL | Status: DC
  Administered 2019-12-09 – 2019-12-10 (×2): 20 mg via ORAL
  Filled 2019-12-08 (×2): qty 2

## 2019-12-08 MED ORDER — ATENOLOL 25 MG PO TABS
50.0000 mg | ORAL_TABLET | Freq: Every day | ORAL | Status: DC
  Administered 2019-12-10 – 2019-12-13 (×4): 50 mg via ORAL
  Filled 2019-12-08 (×4): qty 2

## 2019-12-08 MED ORDER — SODIUM CHLORIDE 0.9 % IV SOLN: INTRAVENOUS | Status: DC

## 2019-12-08 MED ORDER — ACETAMINOPHEN 160 MG/5ML PO SOLN: 650.0000 mg | ORAL | Status: DC | PRN

## 2019-12-08 MED ORDER — FAMOTIDINE 20 MG PO TABS
20.0000 mg | ORAL_TABLET | Freq: Every day | ORAL | Status: DC
  Administered 2019-12-10 – 2019-12-13 (×4): 20 mg via ORAL
  Filled 2019-12-08 (×4): qty 1

## 2019-12-08 MED ORDER — QUETIAPINE FUMARATE 50 MG PO TABS
50.0000 mg | ORAL_TABLET | Freq: Every day | ORAL | Status: DC
  Administered 2019-12-09 – 2019-12-12 (×4): 50 mg via ORAL
  Filled 2019-12-08 (×5): qty 1

## 2019-12-08 MED ORDER — ACETAMINOPHEN 325 MG PO TABS
650.0000 mg | ORAL_TABLET | ORAL | Status: DC | PRN
  Administered 2019-12-12: 650 mg via ORAL
  Filled 2019-12-08: qty 2

## 2019-12-08 MED ORDER — STROKE: EARLY STAGES OF RECOVERY BOOK
Freq: Once | Status: AC
  Filled 2019-12-08: qty 1

## 2019-12-08 MED ORDER — LABETALOL HCL 5 MG/ML IV SOLN
10.0000 mg | INTRAVENOUS | Status: DC | PRN
  Administered 2019-12-08 – 2019-12-13 (×6): 10 mg via INTRAVENOUS
  Filled 2019-12-08 (×6): qty 4

## 2019-12-08 MED ORDER — LORAZEPAM 2 MG/ML IJ SOLN
1.0000 mg | Freq: Once | INTRAMUSCULAR | Status: AC
  Administered 2019-12-08: 1 mg via INTRAVENOUS
  Filled 2019-12-08: qty 1

## 2019-12-08 MED ORDER — AMLODIPINE BESYLATE 5 MG PO TABS
5.0000 mg | ORAL_TABLET | Freq: Every morning | ORAL | Status: DC
  Administered 2019-12-10 – 2019-12-11 (×2): 5 mg via ORAL
  Filled 2019-12-08 (×2): qty 1

## 2019-12-08 NOTE — Progress Notes (Signed)
Obtained report from ED RN Alyson Ingles

## 2019-12-08 NOTE — ED Provider Notes (Signed)
[facility] EMERGENCY DEPARTMENT Provider Note   CSN: [id number] Arrival date & time: 12/08/19  1010     History Altered mental status  [name] is a 84 y.o. male.  HPI   Patient presents to the ED for evaluation of altered mental status.  Patient has a history of stroke.  According to the EMS report over the last 3 to 4 days he has had increased somnolence.  He has had episodes of blank stares and incontinence.  He has not been cooperative with his cares and medications.  At baseline the patient has had a prior stroke with significant left-sided deficits and contractures.  In the emergency room the patient will look at me when I speak to him but otherwise does not follow any commands.  He is not able to provide any history.  Past Medical History:  Diagnosis Date  . Anxiety   . Arthritis   . Asthma   . Cancer [facility])    prostate  . Chronic kidney disease   . Coronary artery disease   . Diabetes mellitus without complication ([place])   . Gout   . High cholesterol   . HOH (hard of hearing) bilateral  . Hypertension   . PONV (postoperative nausea and vomiting)   . Right thalamic infarction ([place]) 06/17/2019  . Shingles   . Stroke [facility])     Patient Active Problem List   Diagnosis Date Noted  . Primary osteoarthritis of left knee 07/10/2019  . Labile blood glucose   . Acute blood loss anemia   . Hypoalbuminemia due to protein-calorie malnutrition ([place])   . Controlled type 2 diabetes mellitus with hyperglycemia, without long-term current use of insulin ([place])   . Right thalamic infarction ([place]) 06/17/2019  . Bilateral hearing loss   . Coronary artery disease involving coronary bypass graft of native heart without angina pectoris   . Diabetes mellitus type 2 in nonobese (H[facility])   . Bilateral carotid artery stenosis   . History of CVA with residual deficit   . Essential hypertension 06/12/2019  . CKD (chronic kidney disease), stage III 06/12/2019  . CVA  (cerebral vascular accident) ([place]) 06/11/2019  . Speech abnormality 06/10/2019    Past Surgical History:  Procedure Laterality Date  . CATARACT EXTRACTION W/PHACO  11/28/2011   Procedure: CATARACT EXTRACTION PHACO AND INTRAOCULAR LENS PLACEMENT (IOC);  Surgeon: Williams Che, MD;  Location: [facility];  Service: Ophthalmology;  Laterality: Right;  CDE 3.84  . CORONARY ARTERY BYPASS GRAFT  1997   6 vessels  . EYE SURGERY  1995   left eye cataract removal   . TOTAL KNEE  PROSTHESIS REMOVAL W/ SPACER INSERTION  8 yrs ago   right       Family History  Problem Relation Age of Onset  . Heart disease Mother   . Heart attack Brother   . Heart attack Paternal Grandfather   . Anesthesia problems Neg Hx   . Hypotension Neg Hx   . Malignant hyperthermia Neg Hx   . Pseudochol deficiency Neg Hx     Social History   Tobacco Use  . Smoking status: Never Smoker  . Smokeless tobacco: Never Used  Substance Use Topics  . Alcohol use: No  . Drug use: No    Home Medications Prior to Admission medications   Medication Sig Start Date End Date Taking? Authorizing Provider  acetaminophen (TYLENOL) 325 MG tablet Take 2 tablets (650 mg total) by mouth every 4 (four) hours  as needed for mild pain (or temp > 37.5 C (99.5 F)). 06/27/19  Yes Angiulli, Lavon Paganini, PA-C  albuterol (VENTOLIN HFA) 108 (90 Base) MCG/ACT inhaler Inhale 1-2 puffs into the lungs every 6 (six) hours as needed for wheezing or shortness of breath. 06/27/19  Yes Angiulli, Lavon Paganini, PA-C  allopurinol (ZYLOPRIM) 300 MG tablet Take 1 tablet (300 mg total) by mouth at bedtime. 06/27/19  Yes Angiulli, Lavon Paganini, PA-C  ALPRAZolam Duanne Moron) 0.5 MG tablet Take 0.5 mg by mouth 3 (three) times daily as needed for anxiety. 10/05/19  Yes [provider]  amLODipine (NORVASC) 5 MG tablet Take 1 tablet (5 mg total) by mouth every morning. 06/27/19  Yes Angiulli, Lavon Paganini, PA-C  atenolol (TENORMIN) 100 MG tablet Take 50 mg by mouth daily.   07/31/19  Yes [provider]  atorvastatin (LIPITOR) 40 MG tablet Take 0.5 tablets (20 mg total) by mouth at bedtime. 06/27/19  Yes Angiulli, Lavon Paganini, PA-C  cholecalciferol (VITAMIN D3) 25 MCG (1000 UT) tablet Take 1 tablet (1,000 Units total) by mouth every evening. 06/27/19  Yes Angiulli, Lavon Paganini, PA-C  clopidogrel (PLAVIX) 75 MG tablet Take 1 tablet (75 mg total) by mouth daily. 06/27/19  Yes Angiulli, Lavon Paganini, PA-C  diclofenac Sodium (VOLTAREN) 1 % GEL Apply 2 g topically daily as needed (pain).   Yes [provider]  famotidine (PEPCID) 20 MG tablet Take 1 tablet (20 mg total) by mouth daily. 06/27/19  Yes Angiulli, Lavon Paganini, PA-C  furosemide (LASIX) 20 MG tablet Take 20 mg by mouth every morning. *May take as needed for fluid retention 04/23/19  Yes [provider]  glipiZIDE (GLUCOTROL) 5 MG tablet Take 1 tablet (5 mg total) by mouth every morning. 06/27/19  Yes Angiulli, Lavon Paganini, PA-C  loratadine (CLARITIN) 10 MG tablet Take 10 mg by mouth daily.   Yes [provider]  Lutein 20 MG CAPS Take 1 capsule by mouth every evening.    Yes [provider]  theophylline (THEODUR) 300 MG 12 hr tablet Take 300 mg by mouth 2 (two) times daily.   Yes [provider]  vitamin B-12 (CYANOCOBALAMIN) 250 MCG tablet Take 250 mcg by mouth every evening.   Yes [provider]  vitamin C (ASCORBIC ACID) 500 MG tablet Take 500 mg by mouth every evening.   Yes [provider]  VITAMIN E COMPLEX PO Take 1 capsule by mouth every evening.   Yes [provider]  blood glucose meter kit and supplies KIT Dispense based on patient and insurance preference. Use up to four times daily as directed. (FOR ICD-9 250.00, 250.01). 06/29/19   Jamse Arn, MD    Allergies    Nsaids  Review of Systems   Review of Systems  Unable to perform ROS: Mental status change    Physical Exam Updated Vital Signs BP (!) 181/81 (BP Location:  Right Arm)   Pulse 73   Temp 97.7 F (36.5 C) (Rectal) Comment: No breakdown noted to skin with temp check.   Resp 14   SpO2 94%   Physical Exam Vitals and nursing note reviewed.  Constitutional:      Appearance: He is well-developed. He is not ill-appearing.  HENT:     Head: Normocephalic and atraumatic.     Right Ear: External ear normal.     Left Ear: External ear normal.  Eyes:     General: No scleral icterus.       Right eye:  No discharge.        Left eye: No discharge.     Conjunctiva/sclera: Conjunctivae normal.  Neck:     Trachea: No tracheal deviation.  Cardiovascular:     Rate and Rhythm: Normal rate and regular rhythm.  Pulmonary:     Effort: Pulmonary effort is normal. No respiratory distress.     Breath sounds: Normal breath sounds. No stridor. No wheezing or rales.  Abdominal:     General: Bowel sounds are normal. There is no distension.     Palpations: Abdomen is soft.     Tenderness: There is no abdominal tenderness. There is no guarding or rebound.  Musculoskeletal:        General: No tenderness.     Cervical back: Neck supple.  Skin:    General: Skin is warm and dry.     Findings: No rash.  Neurological:     Mental Status: He is alert.     Cranial Nerves: Cranial nerve deficit:        Motor: Atrophy present.     Comments: Patient grunts in response to uncomfortable stimuli, atrophy and contractures noted left upper extremity, spontaneous movement noted bilateral feet, patient does move his right hand purposefully in response to uncomfortable stimuli, no movement left hand, patient turns his head and looks at me when I speak to him but otherwise does not follow commands or answer any questions  Psychiatric:        Behavior: Behavior is uncooperative.     ED Results / Procedures / Treatments   Labs (all labs ordered are listed, but only abnormal results are displayed) Labs Reviewed  CBC - Abnormal; Notable for the following components:      Result  Value   Hemoglobin 12.8 (*)    All other components within normal limits  COMPREHENSIVE METABOLIC PANEL - Abnormal; Notable for the following components:   Glucose, Bld 130 (*)    BUN 31 (*)    Creatinine, Ser 2.13 (*)    GFR calc non Af Amer 28 (*)    GFR calc Af Amer 33 (*)    All other components within normal limits  CBG MONITORING, ED - Abnormal; Notable for the following components:   Glucose-Capillary 128 (*)    All other components within normal limits  SARS CORONAVIRUS 2 (TAT 6-24 HRS)  ETHANOL  PROTIME-INR  APTT  DIFFERENTIAL  RAPID URINE DRUG SCREEN, HOSP PERFORMED  URINALYSIS, ROUTINE W REFLEX MICROSCOPIC    EKG EKG Interpretation  Date/Time:  Sunday December 08 2019 10:24:27 EDT Ventricular Rate:  73 PR Interval:    QRS Duration: 145 QT Interval:  413 QTC Calculation: 456 R Axis:   48 Text Interpretation: sinus rhythm Ventricular premature complex Left bundle branch block No significant change since last tracing Confirmed by Dorie Rank [phone]) on 12/08/2019 10:30:50 AM   Radiology CT HEAD WO CONTRAST  Result Date: 12/08/2019 CLINICAL DATA:  Altered mental status. EXAM: CT HEAD WITHOUT CONTRAST TECHNIQUE: Contiguous axial images were obtained from the base of the skull through the vertex without intravenous contrast. COMPARISON:  CT head dated 06/10/2019. FINDINGS: Brain: No evidence of acute infarction, hemorrhage, hydrocephalus, extra-axial collection or mass lesion/mass effect. There is mild cerebral volume loss with associated ex vacuo dilatation. Periventricular white matter hypoattenuation likely represents chronic small vessel ischemic disease. Chronic infarcts are seen in the right basal ganglia/internal capsule and the left occipital lobe. Vascular: There are vascular calcifications in the carotid siphons. Skull: Normal. Negative for  fracture or focal lesion. Sinuses/Orbits: No acute finding. Other: None. IMPRESSION: 1. No acute intracranial process.  Electronically Signed   By: Zerita Boers M.D.   On: 12/08/2019 11:59   DG Chest Portable 1 View  Result Date: 12/08/2019 CLINICAL DATA:  Altered level of consciousness EXAM: PORTABLE CHEST 1 VIEW COMPARISON:  Chest radiograph dated 06/04/2019 FINDINGS: The heart remains enlarged. Median sternotomy wires are unchanged with fracture of 2 of the wires. A small left pleural effusion with associated atelectasis/airspace disease is noted. The right lung is clear. There is no pneumothorax. There is chronic deformity of the left shoulder. IMPRESSION: Small left pleural effusion with associated atelectasis/airspace disease. Electronically Signed   By: Zerita Boers M.D.   On: 12/08/2019 11:24    Procedures Procedures (including critical care time)  Medications Ordered in ED Medications - No data to display  ED Course  I have reviewed the triage vital signs and the nursing notes.  Pertinent labs & imaging results that were available during my care of the patient were reviewed by me and considered in my medical decision making (see chart for details).  Clinical Course as of Dec 07 1417  Sun Dec 08, 2019  1257 Laboratory tests reviewed.  Creatinine elevated but this is stable   [JK]  1257 Head CT without acute finding   [JK]  1257 Chest x-ray with atelectasis but no other acute findings   [JK]  1351 Discussed with Dr Rory Percy.  WIll consult on pt.  MRI recommended   [JK]    Clinical Course User Index [JK] Dorie Rank, MD   MDM Rules/Calculators/A&P                      Patient was recently seen in the neurology office for a decline in his cognition.  Patient has history of prior right thalamic stroke.  Patient's wife noted a change in his cognition back in February but they had to cancel appointments because of weather conditions.  He did follow-up in the office this month.  Plan was for outpatient MRI.  However patient symptoms worsened over this weekend.  On exam the patient is not speaking  which he was doing previously.  He does have atrophy and weakness of his left upper extremity but this was noted on his prior stroke.  His laboratory tests do not show any acute changes.  Chest x-ray shows small atelectasis but I do not think this accounts for his symptoms.  This is pending.  Unclear if the patient could be having additional occult strokes or possibly intermittent seizures with some of the spells that the wife describes.  I discussed the case with Dr. Malen Gauze.  We will proceed with MRI.  He will consult on the patient .  I will consult with the medical service for admission and further evaluation. Final Clinical Impression(s) / ED Diagnoses Final diagnoses:  Altered mental status, unspecified altered mental status type     Dorie Rank, MD 12/08/19 1419

## 2019-12-08 NOTE — ED Notes (Signed)
Patient transported to CT 

## 2019-12-08 NOTE — Consult Note (Signed)
Neurology Consultation  Reason for Consult: Worsening mentation Referring Physician: Dr. Tomi Bamberger, ED provider  CC: Worsening mentation  History is obtained from: Wife, chart review  HPI: [name] is a 84 y.o. male past medical history of diabetes, coronary artery disease, hypertension, stroke with residual left hemiparesis, and a history of worsening mentation since September 2020 comes in for evaluation of continuing worsening cognition to the point where the wife cannot take care of him at home and has noticed a stark change in his personality.  According to the wife, he had a stroke in September 2020, and prior to that was completely independent.  After the stroke, the wife took over bills and other important financial transactions for their household.  He was recovering with therapy but ever since sometime in January 2021, started to exhibit multiple unusual behaviors.  His memory seem to have been off.  He could recollect long-term events pretty sharply but had extreme trouble with short-term memory.  He also started having trouble using his walker and with his gait.  He also started hallucinating thinking that there are people and spiders in the house.  He has had episodes of blankly staring without being responsive.  No active seizure activity noted.  No tongue biting.  No falls that are explainable. He had started to become distant to her and at times not even identifying her and identifying her as a caretaker and stop calling her by her name at times.  She said at times he there would be watching television-as an example during the black history month, they were highlighting a famous sports personality with a very difficult name that he remembered from his younger days and remembered a lot more detail about that ball player then anybody else would know but as far as short-term memory is concerned that has continued to deteriorate.  In talking to her it seems like he gets disoriented  after waking up and it takes him a while to acclimatized to his surroundings.  He has been treated for UTI by his urologist in December 2020.  He does have difficulty with urination due to his prostate issues at baseline.  In the emergency room, he was noted to be completely mute.  He does have extreme hardness of hearing and is difficult to communicate with at baseline based on the prior neurology notes but he would not participate in any exam with the ED provider.  He also would not participate in any activity with his wife when she started to facilitate examination.  On my examination, he would only repeat a sentence as documented below.  He actively resisted every movement that I attempted to elicit on his arms and legs and was getting visibly upset on being tested.  His blood pressures in the emergency room ranged from 734K to 876O systolic, which is not usual for him according to the wife  His wife also had concerns of right leg weakness which seems to been ongoing now for a few days to weeks, when the stroke had only left him with deficits on the left side.  This is new.  Also new is him scratching his chest area as if he is uncomfortable-could not get much more usable detail on this regard.  She was also concerned that he might be having a UTI because he is constantly uncomfortable and tending to his privates. Wife denied any history of underlying depression and anxiety but the chart lists anxiety is a past medical history.  In  September 2020, he was noted to have a small medial right thalamic and right internal capsule infarct along with evidence of old right basal ganglia and left occipital cortical infarcts.  Carotid Dopplers revealed bilateral less than 70% ICA stenosis and CT angiogram showed distal right common carotid 80% and proximal right ICA 60% stenosis along with proximal left ICA 80% stenosis as well as moderate bilateral intracranial atherosclerosis in the carotids.  2D  echocardiogram showed an ejection fraction of 60 to 65% with no source of embolus along with LDL of 66 and hemoglobin A1c of 5.8.  He was discharged home with 3 months of DAPT followed by Plavix only.  He has seen vascular surgery and has been recommended CEA 6 months from now.  LKW: Few months ago with an acute change a few days ago-unclear tpa given?: no, unclear last known well Premorbid modified Rankin scale (mRS): 4  -consistently uses walker to walk and requires help with ADLs ever since the stroke and cannot be left alone for extended period of time whatsoever.   ROS: Patient unable to provide due to altered mental status  Past Medical History:  Diagnosis Date  . Anxiety   . Arthritis   . Asthma   . Cancer [facility])    prostate  . Chronic kidney disease   . Coronary artery disease   . Diabetes mellitus without complication ([place])   . Gout   . High cholesterol   . HOH (hard of hearing) bilateral  . Hypertension   . PONV (postoperative nausea and vomiting)   . Right thalamic infarction ([place]) 06/17/2019  . Shingles   . Stroke [facility])     Family History  Problem Relation Age of Onset  . Heart disease Mother   . Heart attack Brother   . Heart attack Paternal Grandfather   . Anesthesia problems Neg Hx   . Hypotension Neg Hx   . Malignant hyperthermia Neg Hx   . Pseudochol deficiency Neg Hx    Social History:   reports that he has never smoked. He has never used smokeless tobacco. He reports that he does not drink alcohol or use drugs.  Medications  Current Facility-Administered Medications:  .  LORazepam (ATIVAN) injection 1 mg, 1 mg, Intravenous, Once, Dorie Rank, MD  Current Outpatient Medications:  .  acetaminophen (TYLENOL) 325 MG tablet, Take 2 tablets (650 mg total) by mouth every 4 (four) hours as needed for mild pain (or temp > 37.5 C (99.5 F))., Disp:  , Rfl:  .  albuterol (VENTOLIN HFA) 108 (90 Base) MCG/ACT inhaler, Inhale 1-2 puffs into the lungs every 6 (six)  hours as needed for wheezing or shortness of breath., Disp: 6.7 g, Rfl: 1 .  allopurinol (ZYLOPRIM) 300 MG tablet, Take 1 tablet (300 mg total) by mouth at bedtime., Disp: 30 tablet, Rfl: 0 .  ALPRAZolam (XANAX) 0.5 MG tablet, Take 0.5 mg by mouth 3 (three) times daily as needed for anxiety., Disp: , Rfl:  .  amLODipine (NORVASC) 5 MG tablet, Take 1 tablet (5 mg total) by mouth every morning., Disp: 30 tablet, Rfl: 0 .  atenolol (TENORMIN) 100 MG tablet, Take 50 mg by mouth daily. , Disp: , Rfl:  .  atorvastatin (LIPITOR) 40 MG tablet, Take 0.5 tablets (20 mg total) by mouth at bedtime., Disp: 30 tablet, Rfl: 0 .  cholecalciferol (VITAMIN D3) 25 MCG (1000 UT) tablet, Take 1 tablet (1,000 Units total) by mouth every evening., Disp: 30 tablet, Rfl: 0 .  clopidogrel (PLAVIX) 75 MG tablet, Take 1 tablet (75 mg total) by mouth daily., Disp: 30 tablet, Rfl: 0 .  diclofenac Sodium (VOLTAREN) 1 % GEL, Apply 2 g topically daily as needed (pain)., Disp: , Rfl:  .  famotidine (PEPCID) 20 MG tablet, Take 1 tablet (20 mg total) by mouth daily., Disp: 30 tablet, Rfl: 0 .  furosemide (LASIX) 20 MG tablet, Take 20 mg by mouth every morning. *May take as needed for fluid retention, Disp: , Rfl:  .  glipiZIDE (GLUCOTROL) 5 MG tablet, Take 1 tablet (5 mg total) by mouth every morning., Disp: 30 tablet, Rfl: 1 .  loratadine (CLARITIN) 10 MG tablet, Take 10 mg by mouth daily., Disp: , Rfl:  .  Lutein 20 MG CAPS, Take 1 capsule by mouth every evening. , Disp: , Rfl:  .  theophylline (THEODUR) 300 MG 12 hr tablet, Take 300 mg by mouth 2 (two) times daily., Disp: , Rfl:  .  vitamin B-12 (CYANOCOBALAMIN) 250 MCG tablet, Take 250 mcg by mouth every evening., Disp: , Rfl:  .  vitamin C (ASCORBIC ACID) 500 MG tablet, Take 500 mg by mouth every evening., Disp: , Rfl:  .  VITAMIN E COMPLEX PO, Take 1 capsule by mouth every evening., Disp: , Rfl:  .  blood glucose meter kit and supplies KIT, Dispense based on patient and  insurance preference. Use up to four times daily as directed. (FOR ICD-9 250.00, 250.01)., Disp: 1 each, Rfl: 0  Facility-Administered Medications Ordered in Other Encounters:  .  fentaNYL (SUBLIMAZE) injection 25-50 mcg, 25-50 mcg, Intravenous, Q5 min PRN, Lerry Liner, MD   Exam: Current vital signs: BP (!) 181/81 (BP Location: Right Arm)   Pulse 69   Temp 97.7 F (36.5 C) (Rectal) Comment: No breakdown noted to skin with temp check.   Resp 14   SpO2 97%  Vital signs in last 24 hours: Temp:  [97.7 F (36.5 C)-98.1 F (36.7 C)] 97.7 F (36.5 C) (03/28 1102) Pulse Rate:  [69-73] 69 (03/28 1245) Resp:  [14] 14 (03/28 1245) BP: (181)/(81) 181/81 (03/28 1022) SpO2:  [94 %-97 %] 97 % (03/28 1245) General: Patient is awake, alert, does not want to participate in the exam based on his demeanor. HEENT: Normocephalic atraumatic, dentures in place CVS: Regular rate rhythm Respiratory: Breathing normally saturating well on room air Abdomen: Nondistended nontender Extremities warm well perfused with no edema Neurological exam He is awake, alert. He did not respond to my first few questions. When I sat down with him and talk to him and asked him what is bothering him, he repeated the sentence "what is bothering me?"  But did not answer the question.  He did not tell me his name.  He did not tell me the date and month. His speech during that one sentence was fairly nondysarthric. Cranial nerves: Pupils are equal round reactive to light, extraocular movements are difficult to assess as he does not cooperate with the exam but he tends to favor the right side more than left with his gaze, although he is able to look to the left.  He blinks to threat inconsistently from both sides.  Face appears grossly symmetric.  He is extremely hard of hearing in both ears, he had his hearing aids which we put in but still was difficult to communicate with him. Motor exam: He actively resists movement in any  of his extremities by resisting your movements.  Appears paratonic.  Left anteriorly has weakness and  increased tone along with hand in contracture. Sensory exam: Winces and attempts to withdraw to noxious stimulation in all fours. Coordination: Did not cooperate Gait testing was deferred at this time. NIHSS 1a Level of Conscious.: 0 1b LOC Questions:2  1c LOC Commands: 2 2 Best Gaze: 1 3 Visual: 0 4 Facial Palsy: 0 5a Motor Arm - left: 2 5b Motor Arm - Right: 2 6a Motor Leg - Left: 2 6b Motor Leg - Right: 2 7 Limb Ataxia: 0 8 Sensory: 0 9 Best Language: 1 10 Dysarthria: 0 11 Extinct. and Inatten.: 0 TOTAL: 14  Labs I have reviewed labs in epic and the results pertinent to this consultation are: CBC    Component Value Date/Time   WBC 6.8 12/08/2019 1113   RBC 4.47 12/08/2019 1113   HGB 12.8 (L) 12/08/2019 1113   HGB 12.0 (L) 11/25/2019 1158   HCT 40.1 12/08/2019 1113   HCT 36.8 (L) 11/25/2019 1158   PLT 259 12/08/2019 1113   PLT 306 11/25/2019 1158   MCV 89.7 12/08/2019 1113   MCV 86 11/25/2019 1158   MCH 28.6 12/08/2019 1113   MCHC 31.9 12/08/2019 1113   RDW 15.3 12/08/2019 1113   RDW 15.7 (H) 11/25/2019 1158   LYMPHSABS 1.1 12/08/2019 1113   LYMPHSABS 1.5 11/25/2019 1158   MONOABS 0.6 12/08/2019 1113   EOSABS 0.2 12/08/2019 1113   EOSABS 0.4 11/25/2019 1158   BASOSABS 0.0 12/08/2019 1113   BASOSABS 0.1 11/25/2019 1158    CMP     Component Value Date/Time   NA 144 12/08/2019 1113   NA 142 11/25/2019 1158   K 3.9 12/08/2019 1113   CL 110 12/08/2019 1113   CO2 24 12/08/2019 1113   GLUCOSE 130 (H) 12/08/2019 1113   BUN 31 (H) 12/08/2019 1113   BUN 50 (H) 11/25/2019 1158   CREATININE 2.13 (H) 12/08/2019 1113   CALCIUM 10.1 12/08/2019 1113   PROT 8.0 12/08/2019 1113   ALBUMIN 3.9 12/08/2019 1113   AST 16 12/08/2019 1113   ALT 12 12/08/2019 1113   ALKPHOS 70 12/08/2019 1113   BILITOT 1.0 12/08/2019 1113   GFRNONAA 28 (L) 12/08/2019 1113   GFRAA 33 (L)  12/08/2019 1113   Imaging I have reviewed the images obtained: CT-scan of the brain-no acute changes Chest x-ray with left pleural effusion   Outpatient work-up included a urinalysis on 11/25/2019 with no evidence of UTI.   [id number]. BMP with glucose of 130, creatinine 2.13, BUN 31.  TSH 3.48  Assessment: 84 year old past history of diabetes, coronary disease, hypertension, stroke in September 2020 with residual left hemiparesis coming in for evaluation of worsening cognition that has been now going on since the time of his last stroke in 2020. The wife explains multiple difficulties including his behavioral disturbances, hallucinations as well as resistance to eating which started a few days ago and resisting any kind of help. I suspect that he had some sort of an underlying dementia that has been unmasked by the stroke and he is rapidly developing cognitive deficits. Sometimes toxic or metabolic derangements can also mimic these. I would recommend admission for further observation and work-up New stroke could also be in consideration and so can be seizures due to staring spells and history of strokes although the most recent strokes were subcortical/deep gray matter and internal capsule but has evidence of prior cortical strokes.  Impression: -Multifactorial toxic metabolic encephalopathy -Possible underlying dementia with behavioral disturbance unmasked by stroke/vascular dementia. -Evaluate for new stroke -  Evaluate for seizure  Recommendations: -Chest x-ray shows some pleural effusion-will defer management to primary team -Check urinalysis -Routine EEG -MRI brain without contrast -Can use Seroquel if he gets agitated if QTC is normal. -Due to the course of this presentation being many months I do not suspect an underlying CNS infection and do not see a need for spinal tap at this time. -Can also consult psychiatry if he becomes a little bit more cooperative with the exam-Seroquel  might help.  Plan discussed with his wife as well as ED provider Dr. Tomi Bamberger.  Please call with questions.  -- Amie Portland, MD [facility] Pager: [phone] If 7pm to 7am, please call on call as listed on AMION.

## 2019-12-08 NOTE — H&P (Signed)
History and Physical    [name] AQT:[id number] DOB: [birth year] DOA: 12/08/2019  PCP: [name], MD Consultants:  Jeffie Pollock - urology; Early - vascular; Kolluru - nephrology; [facility] Patient coming from:  Home - lives with wife; NOK: Wife, [phone]; Daughter, Abigail Butts [phone]  Chief Complaint: AMS  HPI: [name] is a 84 y.o. male with medical history significant of CVA; HTN; HLD; DM; CAD; prostate CA; and CKD presenting with AMS.  He was here in October for a stroke.  She reports "just a complete change."  She took him to neurology on 3/15.  The last 3 days, he has stopped eating, completely confused, not drinking, won't stand up, won't use the urinal. She reports that this is dramatically different from his prior, staring into space.  Symptoms started Wednesday and worsened Thursday.  He has chronic right-sided hemiplegia but this is unchanged.  He has been very confused over the last month but worse this week.  He would not release his dentures to her.  He is having AV hallucinations.  His daughter reports h/o CVA in Sept/Oct.  He had home therapy services.  He was able to get through the house, up/down the ramp, go places in the car.  There was a big difference in mid-January - "something happened."  He wasn't able to walk as well.  They needed the transport chair more and more.  He had a few days recently - stubborn, irritated, wouldn't participate in ADLs - and then returned to baseline.  On Wednesday, he started not talking, not eating.  His daughter came to relieve her mother on Thursday and he wouldn't eat well and was staring.  That night, he refused to participate.  He wouldn't take out his dentures or hearing aids.     ED Course:  Apparent gradual cognitive decline - ?Lewy body dementia.  Needs r/o for seizures, stroke.  May not be as acute as presented, may need placement.  Review of Systems: Unable to perform  Ambulatory Status:  Ambulates with a  rolling walker  COVID vaccine status: complete  Past Medical History:  Diagnosis Date  . Anxiety   . Arthritis   . Asthma   . Cancer [facility])    prostate  . Chronic kidney disease   . Coronary artery disease   . Diabetes mellitus without complication ([place])   . Gout   . High cholesterol   . HOH (hard of hearing) bilateral  . Hypertension   . PONV (postoperative nausea and vomiting)   . Right thalamic infarction ([place]) 06/17/2019  . Shingles   . Stroke [facility])     Past Surgical History:  Procedure Laterality Date  . CATARACT EXTRACTION W/PHACO  11/28/2011   Procedure: CATARACT EXTRACTION PHACO AND INTRAOCULAR LENS PLACEMENT (IOC);  Surgeon: Williams Che, MD;  Location: [facility];  Service: Ophthalmology;  Laterality: Right;  CDE 3.84  . CORONARY ARTERY BYPASS GRAFT  1997   6 vessels  . EYE SURGERY  1995   left eye cataract removal   . TOTAL KNEE  PROSTHESIS REMOVAL W/ SPACER INSERTION  8 yrs ago   right    Social History   Socioeconomic History  . Marital status: Married    Spouse name: [name]  . Number of children: 1  . Years of education: 54  . Highest education level: Not on file  Occupational History  . Not on file  Tobacco Use  . Smoking status: Never Smoker  . Smokeless tobacco: Never  Used  Substance and Sexual Activity  . Alcohol use: No  . Drug use: No  . Sexual activity: Yes  Other Topics Concern  . Not on file  Social History Narrative   Lives with wife   No caffeine   Social Determinants of Health   Financial Resource Strain:   . Difficulty of Paying Living Expenses:   Food Insecurity:   . Worried About Charity fundraiser in the Last Year:   . Arboriculturist in the Last Year:   Transportation Needs:   . Film/video editor (Medical):   [name] Lack of Transportation (Non-Medical):   Physical Activity:   . Days of Exercise per Week:   . Minutes of Exercise per Session:   Stress:   . Feeling of Stress :   Social Connections:   . Frequency of  Communication with Friends and Family:   . Frequency of Social Gatherings with Friends and Family:   . Attends Religious Services:   . Active Member of Clubs or Organizations:   . Attends Archivist Meetings:   [name] Marital Status:   Intimate Partner Violence:   . Fear of Current or Ex-Partner:   . Emotionally Abused:   [name] Physically Abused:   . Sexually Abused:     Allergies  Allergen Reactions  . Nsaids Nausea And Vomiting    Issues with gallbladder, tolerate to voltaren gel    Family History  Problem Relation Age of Onset  . Heart disease Mother   . Heart attack Brother   . Heart attack Paternal Grandfather   . Anesthesia problems Neg Hx   . Hypotension Neg Hx   . Malignant hyperthermia Neg Hx   . Pseudochol deficiency Neg Hx     Prior to Admission medications   Medication Sig Start Date End Date Taking? Authorizing Provider  acetaminophen (TYLENOL) 325 MG tablet Take 2 tablets (650 mg total) by mouth every 4 (four) hours as needed for mild pain (or temp > 37.5 C (99.5 F)). 06/27/19  Yes Angiulli, Lavon Paganini, PA-C  albuterol (VENTOLIN HFA) 108 (90 Base) MCG/ACT inhaler Inhale 1-2 puffs into the lungs every 6 (six) hours as needed for wheezing or shortness of breath. 06/27/19  Yes Angiulli, Lavon Paganini, PA-C  allopurinol (ZYLOPRIM) 300 MG tablet Take 1 tablet (300 mg total) by mouth at bedtime. 06/27/19  Yes Angiulli, Lavon Paganini, PA-C  ALPRAZolam Duanne Moron) 0.5 MG tablet Take 0.5 mg by mouth 3 (three) times daily as needed for anxiety. 10/05/19  Yes [provider]  amLODipine (NORVASC) 5 MG tablet Take 1 tablet (5 mg total) by mouth every morning. 06/27/19  Yes Angiulli, Lavon Paganini, PA-C  atenolol (TENORMIN) 100 MG tablet Take 50 mg by mouth daily.  07/31/19  Yes [provider]  atorvastatin (LIPITOR) 40 MG tablet Take 0.5 tablets (20 mg total) by mouth at bedtime. 06/27/19  Yes Angiulli, Lavon Paganini, PA-C  cholecalciferol (VITAMIN D3) 25 MCG (1000 UT) tablet Take 1  tablet (1,000 Units total) by mouth every evening. 06/27/19  Yes Angiulli, Lavon Paganini, PA-C  clopidogrel (PLAVIX) 75 MG tablet Take 1 tablet (75 mg total) by mouth daily. 06/27/19  Yes Angiulli, Lavon Paganini, PA-C  diclofenac Sodium (VOLTAREN) 1 % GEL Apply 2 g topically daily as needed (pain).   Yes [provider]  famotidine (PEPCID) 20 MG tablet Take 1 tablet (20 mg total) by mouth daily. 06/27/19  Yes Angiulli, Lavon Paganini, PA-C  furosemide (LASIX) 20 MG  tablet Take 20 mg by mouth every morning. *May take as needed for fluid retention 04/23/19  Yes [provider]  glipiZIDE (GLUCOTROL) 5 MG tablet Take 1 tablet (5 mg total) by mouth every morning. 06/27/19  Yes Angiulli, Daniel J, PA-C  loratadine (CLARITIN) 10 MG tablet Take 10 mg by mouth daily.   Yes [provider]  Lutein 20 MG CAPS Take 1 capsule by mouth every evening.    Yes [provider]  theophylline (THEODUR) 300 MG 12 hr tablet Take 300 mg by mouth 2 (two) times daily.   Yes [provider]  vitamin B-12 (CYANOCOBALAMIN) 250 MCG tablet Take 250 mcg by mouth every evening.   Yes [provider]  vitamin C (ASCORBIC ACID) 500 MG tablet Take 500 mg by mouth every evening.   Yes [provider]  VITAMIN E COMPLEX PO Take 1 capsule by mouth every evening.   Yes [provider]  blood glucose meter kit and supplies KIT Dispense based on patient and insurance preference. Use up to four times daily as directed. (FOR ICD-9 250.00, 250.01). 06/29/19   Patel, Ankit Anil, MD    Physical Exam: Vitals:   12/08/19 1022 12/08/19 1102 12/08/19 1245  BP: (!) 181/81    Pulse: 73  69  Resp: 14  14  Temp: 98.1 F (36.7 C) 97.7 F (36.5 C)   TempSrc: Oral Rectal   SpO2: 94%  97%     . General:  Appears calm and comfortable and is NAD- but completely unwilling to interact, refused to allow parts of exam . Eyes:  normal lids, squeezed eyes tightly together and refused  evaluation . ENT:  Refused to open mouth even after Ativan, would not release dentures; hearing aids in place . Neck:  no LAD, masses or thyromegaly . Cardiovascular:  RRR, no m/r/g. No LE edema.  . Respiratory:   CTA bilaterally with no wheezes/rales/rhonchi.  Normal respiratory effort. . Abdomen:  soft, NT, ND, NABS . Skin:  no rash or induration seen on limited exam . Musculoskeletal:  grossly normal tone BUE/BLE, no bony abnormality . Psychiatric:  Nonverbal, resisted all efforts at examination . Neurologic:  Unable to perform    Radiological Exams on Admission: CT HEAD WO CONTRAST  Result Date: 12/08/2019 CLINICAL DATA:  Altered mental status. EXAM: CT HEAD WITHOUT CONTRAST TECHNIQUE: Contiguous axial images were obtained from the base of the skull through the vertex without intravenous contrast. COMPARISON:  CT head dated 06/10/2019. FINDINGS: Brain: No evidence of acute infarction, hemorrhage, hydrocephalus, extra-axial collection or mass lesion/mass effect. There is mild cerebral volume loss with associated ex vacuo dilatation. Periventricular white matter hypoattenuation likely represents chronic small vessel ischemic disease. Chronic infarcts are seen in the right basal ganglia/internal capsule and the left occipital lobe. Vascular: There are vascular calcifications in the carotid siphons. Skull: Normal. Negative for fracture or focal lesion. Sinuses/Orbits: No acute finding. Other: None. IMPRESSION: 1. No acute intracranial process. Electronically Signed   By: Tyler  Litton M.D.   On: 12/08/2019 11:59   DG Chest Portable 1 View  Result Date: 12/08/2019 CLINICAL DATA:  Altered level of consciousness EXAM: PORTABLE CHEST 1 VIEW COMPARISON:  Chest radiograph dated 06/04/2019 FINDINGS: The heart remains enlarged. Median sternotomy wires are unchanged with fracture of 2 of the wires. A small left pleural effusion with associated atelectasis/airspace disease is noted. The right lung is  clear. There is no pneumothorax. There is chronic deformity of the left shoulder. IMPRESSION: Small   left pleural effusion with associated atelectasis/airspace disease. Electronically Signed   By: Tyler  Litton M.D.   On: 12/08/2019 11:24    EKG: Independently reviewed.  NSR with rate 73; LBBB; NSCSLT  Labs on Admission: I have personally reviewed the available labs and imaging studies at the time of the admission.  Pertinent labs:   Glucose 130 BUN 31/Creatinine 2.13/GFR 28 - stable Unremarkable CBC INR 1.1 ETOH <10 UA: 100 protein UDS negative   Assessment/Plan Principal Problem:   AMS (altered mental status) Active Problems:   Essential hypertension   CKD (chronic kidney disease), stage III   Bilateral carotid artery stenosis   History of CVA with residual deficit   Controlled type 2 diabetes mellitus with hyperglycemia, without long-term current use of insulin (HCC)   AMS, uncertain etiology -Patient with history of CVA in 05/2019 with waxing and waning MS since -History is difficult to obtain from his wife and daughter, but it appears that he was still able to eat full meals as of last week and could ambulate short distances with a walker -For the last 3-4 days, he has been incontinent of urine, non-ambulatory, and refusing to eat or drink -This is in the setting of apparent paranoia and AV hallucinations and possibly progressive debility -The most likely scenarios appear to be: subacute, recurrent CVA; seizures; or rapidly progressive dementia (likely Lewy Body) that was unmasked with his prior CVA -There does not appear to be medical cause for his presentation other than above, including UTI -Will admit for further evaluation, including PT/OT/ST consults -He appears very likely to need placement - although his wife prefers a respite stay for a couple of weeks and would like for him to return home -Will substitute Seroquel 50 mg qhs for prn Xanax -Telemetry  monitoring -Repeat Echo if MRI is abnormal -Neurology consult -PT/OT/ST/Nutrition Consults -TOC team consult for placement  H/o CVA -He was last seen by the Neurology NP on 11/25/19 -At that visit, the patient's wife and daughter reported worsening cognition with confusion -For prior CVA, he continues to take Plavix daily  HTN -Even if currently AMS is due to CVA, his symptoms have been present for at least 3 days and so he is out of the window for permissive HTN -Will resume Norvasc and Atenolol   HLD -Good lipid control in 05/2019 -Continue Lipitor 40 mg daily   DM -Recent A1c shows good control (5.8 in 05/2019) -Hold home PO medication (Glucotrol) -Will order sensitive-scale SSI -When able to pass swallow evaluation, heart healthy/carb modified diet is appropriate  B carotid stenosis -R and L ICA 60-79% stenosis -He last saw Dr. Early in 08/2019 with plan for close f/u in another 6 months -However, if CVA was thought to be related to carotid stenosis, endarterectomy would need to be considered  Stage 3b-4 CKD -Appears to be stable at this time     Note: This patient has been tested and is negative for the novel coronavirus COVID-19.      DVT prophylaxis:  Lovenox  Code Status: DNR - confirmed with patient/family; ACP documents are present in the chart Family Communication: Wife present throughout evaluation; I also spoke with their daughter by telephone Disposition Plan:  Unless he has significant neurologic recovery, he is very likely to require at least short-term SNF placement Consults called: Neurology; PT/OT/ST/Nutrition; TOC team  Admission status: Admit - It is my clinical opinion that admission to INPATIENT is reasonable and necessary because of the expectation that this patient will   require hospital care that crosses at least 2 midnights to treat this condition based on the medical complexity of the problems presented.  Given the aforementioned information, the  predictability of an adverse outcome is felt to be significant.    Karmen Bongo MD [facility]   How to contact the [facility] Attending or Consulting provider [place] or covering provider during after hours [place], for this patient?  1. Check the care team in [facility] and look for a) attending/consulting TR[facility] provider listed and b) the [facility] team listed 2. Log into [email or web address] and use [place]'s universal password to access. If you do not have the password, please contact the hospital operator. 3. Locate the [facility] provider you are looking for under [facility] and page to a number that you can be directly reached. 4. If you still have difficulty reaching the provider, please page the [facility] (Director on Call) for the Hospitalists listed on amion for assistance.   12/08/2019, 3:43 PM

## 2019-12-08 NOTE — ED Triage Notes (Signed)
Hx of previous CVA and hx of 3-4 days of increasing somunulence, blank stares, incontinence, uncooperative with cares and meds.  Baseline has L side deficits and contractures to LUE.  Pt restful without actue pain responses or SOB.  Responsive to stimuli.

## 2019-12-09 ENCOUNTER — Inpatient Hospital Stay (HOSPITAL_COMMUNITY): Payer: Medicare Other

## 2019-12-09 DIAGNOSIS — E1165 Type 2 diabetes mellitus with hyperglycemia: Secondary | ICD-10-CM

## 2019-12-09 DIAGNOSIS — I6523 Occlusion and stenosis of bilateral carotid arteries: Secondary | ICD-10-CM

## 2019-12-09 DIAGNOSIS — N1832 Chronic kidney disease, stage 3b: Secondary | ICD-10-CM

## 2019-12-09 DIAGNOSIS — I10 Essential (primary) hypertension: Secondary | ICD-10-CM

## 2019-12-09 LAB — LIPID PANEL
Cholesterol: 174 mg/dL (ref 0–200)
HDL: 41 mg/dL (ref >40)
LDL Cholesterol: 104 mg/dL — ABNORMAL HIGH (ref 0–99)
Total CHOL/HDL Ratio: 4.2 RATIO
Triglycerides: 144 mg/dL (ref <150)
VLDL: 29 mg/dL (ref 0–40)

## 2019-12-09 LAB — GLUCOSE, CAPILLARY
Glucose-Capillary: 99 mg/dL (ref 70–99)
Glucose-Capillary: 95 mg/dL (ref 70–99)
Glucose-Capillary: 99 mg/dL (ref 70–99)
Glucose-Capillary: 204 mg/dL — ABNORMAL HIGH (ref 70–99)
Glucose-Capillary: 152 mg/dL — ABNORMAL HIGH (ref 70–99)

## 2019-12-09 LAB — TSH: TSH: 2.281 u[IU]/mL (ref 0.350–4.500)

## 2019-12-09 MED ORDER — ENSURE ENLIVE PO LIQD
237.0000 mL | Freq: Four times a day (QID) | ORAL | Status: DC
  Administered 2019-12-09 – 2019-12-13 (×14): 237 mL via ORAL

## 2019-12-09 NOTE — Progress Notes (Addendum)
NEUROLOGY PROGRESS NOTE  Subjective: Patient is virtually nonverbal.  At one point in time he did state "wait".  Exam: Vitals:   12/09/19 0349 12/09/19 0834  BP: (!) 155/70 (!) 153/87  Pulse: 77 81  Resp: 18 20  Temp: 97.9 F (36.6 C) (!) 97.4 F (36.3 C)  SpO2:  99%   Physical Exam  Constitutional: Appears well-developed and well-nourished.  Eyes: No scleral injection HENT: No OP obstrucion Head: Normocephalic.  Cardiovascular: Normal rate and regular rhythm.  Respiratory: Effort normal, non-labored breathing GI: Soft.  No distension. There is no tenderness.  Skin: WDI  Neuro:  Mental Status: Tracks practitioner in the room, does not follow commands, localizes to pain with right arm. Cranial Nerves: II:  Visual fields grossly normal,  III,IV, VI: ptosis not present, extra-ocular motions intact bilaterally pupils equal, round, reactive to light and accommodation V,VII: Face symmetric, Motor: Moves right arm and leg 5/5.  Paratonic left arm, which is also contracted at elbow 90 degrees. Sensory: Responds to pain throughout all 4 extremities  Medications:  Scheduled: . allopurinol  300 mg Oral QHS  . amLODipine  5 mg Oral q morning - 10a  . atenolol  50 mg Oral Daily  . atorvastatin  20 mg Oral QHS  . clopidogrel  75 mg Oral Daily  . enoxaparin (LOVENOX) injection  40 mg Subcutaneous Q24H  . famotidine  20 mg Oral Daily  . insulin aspart  0-9 Units Subcutaneous TID WC  . QUEtiapine  50 mg Oral QHS   Continuous: . sodium chloride 50 mL/hr at 12/08/19 2134    Pertinent Labs/Diagnostics: None   MR BRAIN WO CONTRAST Result Date: 12/08/2019 Patchy to confluent T2 hyperintensities in the cerebral white matter bilaterally are unchanged from the prior MRI and nonspecific but compatible with moderate chronic small vessel ischemic disease. Small chronic infarcts are again seen in the occipital lobes and medial right parietal lobe. There is also an unchanged chronic  lacunar infarct with hemosiderin deposition involving the right basal ganglia and the genu of the right internal capsule. There is moderate cerebral atrophy.   Etta Quill PA-C [facility] [phone]  Assessment:  84 year old male with past medical history of diabetes, CAD, hypertension and stroke May 15, 2019 with residual left hemiparesis. Neurology was consulted for worsening cognition that has been going on since the time of his last stroke in 2020. 1. Overall presentation most consistent with a multifactorial toxic metabolic encephalopathy, possibly in conjunction with an underlying dementia with behavioral disturbance unmasked by his recent stroke.. 2. MRI brain did not show any new acute stroke. However, extensive changes predisposing to decreased neurological reserve and increased risk of dementia are seen.  3. Current exam as above. 4. EEG: This study is suggestive of moderate diffuse encephalopathy, nonspecific etiology. No seizures or epileptiform discharges were seen throughout the recording. Per EEG tech annotation, 1 episode of staring off was recorded at 1033 without any EEG change and was nonepileptic.   Recommendations: -- Due to the course of this presentation being many months we do not suspect an underlying CNS infection and do not see a need for spinal tap at this time. -- Consider obtaining a psychiatry consult -- Outpatient follow up with Neurology for dementia evaluation.  -- Has borderline QTc. Use Seroquel and other antipsychotics with caution. Consider repeat EKG now that patient is taking Seroquel.  -- Neurology will sign off. Please call if there are additional questions.   Electronically signed: Dr. Kerney Elbe 12/09/2019,  9:45 AM

## 2019-12-09 NOTE — Evaluation (Signed)
Clinical/Bedside Swallow Evaluation Patient Details  Name: [name] MRN: [id number] Date of Birth: Oct 17, 1935  Today's Date: 12/09/2019 Time: SLP Start Time (ACUTE ONLY): 1400 SLP Stop Time (ACUTE ONLY): 1420 SLP Time Calculation (min) (ACUTE ONLY): 20 min  Past Medical History:  Past Medical History:  Diagnosis Date  . Anxiety   . Arthritis   . Asthma   . Cancer [facility])    prostate  . Chronic kidney disease   . Coronary artery disease   . Diabetes mellitus without complication ([place])   . Gout   . High cholesterol   . HOH (hard of hearing) bilateral  . Hypertension   . PONV (postoperative nausea and vomiting)   . Right thalamic infarction ([place]) 06/17/2019  . Shingles   . Stroke [facility])    Past Surgical History:  Past Surgical History:  Procedure Laterality Date  . CATARACT EXTRACTION W/PHACO  11/28/2011   Procedure: CATARACT EXTRACTION PHACO AND INTRAOCULAR LENS PLACEMENT (IOC);  Surgeon: Williams Che, MD;  Location: [facility]S;  Service: Ophthalmology;  Laterality: Right;  CDE 3.84  . CORONARY ARTERY BYPASS GRAFT  1997   6 vessels  . EYE SURGERY  1995   left eye cataract removal   . TOTAL KNEE  PROSTHESIS REMOVAL W/ SPACER INSERTION  8 yrs ago   right   HPI:  24 male admitted with worsening MS; presented after 3-4 days of increasing somnolence, blank stares, incontinence, and uncooperative behaviors. PMH includes right thalamic CVA 05/2019 with residual L-sided weakness and contractures, HTN, HLD, DM, CAD, prostate CA, and CKD.  Pt's wife reports the last few months have been marked by behavioral disturbances, interrupted sleep cycle, hallucinations. MRI negative for acute stroke. Neuro dx multifactorial toxic metabolic encephalopathy, possible underlying dementia unmasked by stroke.  Pt was admitted to CIR after his September stroke - at time of D/C from rehab, he scored 19/30 on the [facility] with deficits in attention, short-term recall, and executive functioning.     Assessment / Plan / Recommendation Clinical Impression  Pt participated in limited clinical swallow assessment, with primary barrier related to his cognition and significant inattention. Wife and dtr present for assessment.  Mrs. Masci expressed concern that her husband's dentures were missing - upon inspection, both upper and lower plate were present in pt's mouth.  He did not allow full oral mechanism exam nor oral care.  He did accept a cup of water, which he brought to his lips with tactile cues to right elbow, and drank 4 oz of water with adequate attention and no s/s of aspiration.  He was unable to hold a spoon, but allowed clinician to offer bites of applesauce, and he ate 4 oz without any difficulty.  When crackers/peanut butter were presented, he had difficulty attending and bringing material to his lips; when offered, he demonstrated no recognition of item and there were no attempts to accept/masticate.  Pt's cognition fluctuated significantly throughout assessment, and was related to his inconsistent attention and ability to execute a motor plan once initiated.  It appears today he is safe to begin a full liquid diet; crush meds in puree.  Allow the pt to feed himself as able with assistance.  D/W pt's wife and dtr Abigail Butts.  SLP will follow.    SLP Visit Diagnosis: Dysphagia, oral phase (R13.11)    Aspiration Risk       Diet Recommendation   full liquids  Medication Administration: Crushed with puree    Other  Recommendations Oral  Care Recommendations: Oral care BID   Follow up Recommendations Other (comment)(tba)      Frequency and Duration min 3x week  1 week       Prognosis Prognosis for Safe Diet Advancement: Fair Barriers to Reach Goals: Cognitive deficits      Swallow Study   General HPI: 22 male admitted with worsening MS; presented after 3-4 days of increasing somnolence, blank stares, incontinence, and uncooperative behaviors. PMH includes right thalamic CVA  05/2019 with residual L-sided weakness and contractures, HTN, HLD, DM, CAD, prostate CA, and CKD.  Pt's wife reports the last few months have been marked by behavioral disturbances, interrupted sleep cycle, hallucinations. MRI negative for acute stroke. Neuro dx multifactorial toxic metabolic encephalopathy, possible underlying dementia unmasked by stroke.  Pt was admitted to CIR after his September stroke - at time of D/C from rehab, he scored 19/30 on the [facility] with deficits in attention, short-term recvall, and executive functioning.  Type of Study: Bedside Swallow Evaluation Diet Prior to this Study: NPO Temperature Spikes Noted: No Respiratory Status: Room air History of Recent Intubation: No Behavior/Cognition: Alert;Confused Oral Cavity Assessment: Other (comment)(pt won't allow assessment) Oral Care Completed by SLP: No Oral Cavity - Dentition: Dentures, top;Dentures, bottom Self-Feeding Abilities: Needs assist Patient Positioning: Upright in bed Baseline Vocal Quality: Normal Volitional Cough: Cognitively unable to elicit Volitional Swallow: Unable to elicit    Oral/Motor/Sensory Function Overall Oral Motor/Sensory Function: Other (comment)(symmetric at baseline; does not f/c)   Ice Chips Ice chips: Not tested   Thin Liquid Thin Liquid: Within functional limits Presentation: Straw    Nectar Thick Nectar Thick Liquid: Not tested   Honey Thick Honey Thick Liquid: Not tested   Puree Puree: Within functional limits   Solid     Solid: Impaired Oral Phase Impairments: Poor awareness of bolus      Juan Quam Laurice 12/09/2019,3:10 PM  Estill Bamberg L. Tivis Ringer, [place] Office number [phone] Pager [phone]

## 2019-12-09 NOTE — Evaluation (Signed)
Speech Language Pathology Evaluation Patient Details Name: [name] MRN: [id number] DOB: July 23, 1936 Today's Date: 12/09/2019 Time: 8676-1950 SLP Time Calculation (min) (ACUTE ONLY): 15 min  Problem List:  Patient Active Problem List   Diagnosis Date Noted  . AMS (altered mental status) 12/08/2019  . Primary osteoarthritis of left knee 07/10/2019  . Labile blood glucose   . Acute blood loss anemia   . Hypoalbuminemia due to protein-calorie malnutrition ([place])   . Controlled type 2 diabetes mellitus with hyperglycemia, without long-term current use of insulin ([place])   . Right thalamic infarction ([place]) 06/17/2019  . Bilateral hearing loss   . Coronary artery disease involving coronary bypass graft of native heart without angina pectoris   . Diabetes mellitus type 2 in nonobese (HCC)   . Bilateral carotid artery stenosis   . History of CVA with residual deficit   . Essential hypertension 06/12/2019  . CKD (chronic kidney disease), stage III 06/12/2019  . CVA (cerebral vascular accident) ([place]) 06/11/2019  . Speech abnormality 06/10/2019   Past Medical History:  Past Medical History:  Diagnosis Date  . Anxiety   . Arthritis   . Asthma   . Cancer [facility])    prostate  . Chronic kidney disease   . Coronary artery disease   . Diabetes mellitus without complication ([place])   . Gout   . High cholesterol   . HOH (hard of hearing) bilateral  . Hypertension   . PONV (postoperative nausea and vomiting)   . Right thalamic infarction ([place]) 06/17/2019  . Shingles   . Stroke [facility])    Past Surgical History:  Past Surgical History:  Procedure Laterality Date  . CATARACT EXTRACTION W/PHACO  11/28/2011   Procedure: CATARACT EXTRACTION PHACO AND INTRAOCULAR LENS PLACEMENT (IOC);  Surgeon: Williams Che, MD;  Location: [facility] ORS;  Service: Ophthalmology;  Laterality: Right;  CDE 3.84  . CORONARY ARTERY BYPASS GRAFT  1997   6 vessels  . EYE SURGERY  1995   left eye cataract removal   .  TOTAL KNEE  PROSTHESIS REMOVAL W/ SPACER INSERTION  8 yrs ago   right   HPI:  25 male admitted with worsening MS; presented after 3-4 days of increasing somnolence, blank stares, incontinence, and uncooperative behaviors. PMH includes right thalamic CVA 05/2019 with residual L-sided weakness and contractures, HTN, HLD, DM, CAD, prostate CA, and CKD.  Pt's wife reports the last few months have been marked by behavioral disturbances, interrupted sleep cycle, hallucinations. MRI negative for acute stroke. Neuro dx multifactorial toxic metabolic encephalopathy, possible underlying dementia unmasked by stroke.  Pt was admitted to CIR after his September stroke - at time of D/C from rehab, he scored 19/30 on the [facility] with deficits in attention, short-term recall, and executive functioning.    Assessment / Plan / Recommendation Clinical Impression  Pt presents with significant impairments in cognition, worsening over the last several months per family. During today's assessment, he could focus briefly on clinician, task, and at times could sustain attention, but more often he required max verbal cues to re-focus.  Questions were intermittently answered correctly (e.g, his name, his address, his DOB), at other times there was no response.  He generally did not follow commands and intermittently resisted efforts to physically assist with a task.  Speech was intermittently intelligible/relevant; at other times it was incoherent with word-finding deficits present. Output was fluent.  Recommend ongoing SLP therapy while here, with focus primarily on education to assist family on best ways  to facilitate communication.     SLP Assessment  SLP Recommendation/Assessment: Patient needs continued Speech Lanaguage Pathology Services SLP Visit Diagnosis: Cognitive communication deficit (R41.841)    Follow Up Recommendations  Other (comment)(tba)    Frequency and Duration min 3x week  1 week      SLP  Evaluation Cognition  Overall Cognitive Status: Difficult to assess Arousal/Alertness: Awake/alert Orientation Level: Disoriented X4 Attention: Focused Focused Attention: Impaired Focused Attention Impairment: Verbal basic;Functional basic Memory: Impaired Memory Impairment: Storage deficit;Decreased short term memory Decreased Short Term Memory: Verbal basic;Functional basic Awareness: Impaired Awareness Impairment: Intellectual impairment Problem Solving: Impaired Problem Solving Impairment: Verbal basic Behaviors: Restless;Perseveration Safety/Judgment: Impaired       Comprehension  Auditory Comprehension Overall Auditory Comprehension: Impaired Yes/No Questions: Impaired Basic Biographical Questions: 0-25% accurate Reading Comprehension Reading Status: (no oral reading could be elicited)    Expression Expression Primary Mode of Expression: Verbal Verbal Expression Overall Verbal Expression: Impaired Automatic Speech: Name Level of Generative/Spontaneous Verbalization: Sentence Repetition: Impaired Naming: Impairment Written Expression Dominant Hand: Right   Oral / Motor  Oral Motor/Sensory Function Overall Oral Motor/Sensory Function: Other (comment)(symmetric at baseline; does not f/c) Motor Speech Overall Motor Speech: Appears within functional limits for tasks assessed   GO                    Juan Quam Laurice 12/09/2019, 3:26 PM   Glori Machnik L. Tivis Ringer, [place] Office number [phone] Pager [phone]

## 2019-12-09 NOTE — Progress Notes (Signed)
PROGRESS NOTE  [name] NWG:[id number] DOB: 07-26-1936 DOA: 12/08/2019 PCP: [name], MD  Brief History    [name] is a 84 y.o. male with medical history significant of CVA; HTN; HLD; DM; CAD; prostate CA; and CKD presenting with AMS.  He was here in October for a stroke.  She reports "just a complete change."  She took him to neurology on 3/15.  The last 3 days, he has stopped eating, completely confused, not drinking, won't stand up, won't use the urinal. She reports that this is dramatically different from his prior, staring into space.  Symptoms started Wednesday and worsened Thursday.  He has chronic right-sided hemiplegia but this is unchanged.  He has been very confused over the last month but worse this week.  He would not release his dentures to her.  He is having AV hallucinations.  His daughter reports h/o CVA in Sept/Oct.  He had home therapy services.  He was able to get through the house, up/down the ramp, go places in the car.  There was a big difference in mid-January - "something happened."  He wasn't able to walk as well.  They needed the transport chair more and more.  He had a few days recently - stubborn, irritated, wouldn't participate in ADLs - and then returned to baseline.  On Wednesday, he started not talking, not eating.  His daughter came to relieve her mother on Thursday and he wouldn't eat well and was staring.  That night, he refused to participate.  He wouldn't take out his dentures or hearing aids.    Apparent gradual cognitive decline - ?Lewy body dementia.  Needs r/o for seizures, stroke.  May not be as acute as presented, may need placement.  [facility] were consulted to admit the patient for further evaluation and treatment.  The patient was admitted to a telemetry bed. Neurology was consulted. The patient underwent EEG which demonstrated a moderate diffuse encephalopathy of non-specific etiology. No seizure or epileptiform  discharge were seen. MRI Brain demonstrated no acute intracranial abnormality and moderate chronic small vessel ischemic disease and cerebral atrophy. He has been evaluated by PT/OT. Their recommendation is for CIR.  Consultants  . Neurology  Procedures  . None  Subjective  The patient is resting comfortably in his bed. No new complaints.  Objective   Vitals:  Vitals:   12/09/19 1224 12/09/19 1522  BP: (!) 173/77   Pulse: 76 83  Resp: 12   Temp: 98 F (36.7 C)   SpO2: 97%    Exam:  Constitutional:  . The patient is awake and alert. No speech and he does not follow commands. Respiratory:  . No increased work of breathing. . No wheezes, rales, or rhonchi . No tactile fremitus Cardiovascular:  . Regular rate and rhythm . No murmurs, ectopy, or gallups. . No lateral PMI. No thrills. Abdomen:  . Abdomen is soft, non-tender, non-distended . No hernias, masses, or organomegaly . Normoactive bowel sounds.  Musculoskeletal:  . No cyanosis, clubbing, or edema Skin:  . No rashes, lesions, ulcers . palpation of skin: no induration or nodules Neurologic:  . CN 2-12 intact . Sensation all 4 extremities intact Psychiatric:  . Mental status o Mood, affect appropriate o Orientation to person, place, time  . judgment and insight appear intact  I have personally reviewed the following:   Today's Data  . Vitals, CMP, Lipid panel, CBC .  Imaging  . MRI Brain . CT Brain  Other Data  . EEG  Scheduled Meds: . allopurinol  300 mg Oral QHS  . amLODipine  5 mg Oral q morning - 10a  . atenolol  50 mg Oral Daily  . atorvastatin  20 mg Oral QHS  . clopidogrel  75 mg Oral Daily  . enoxaparin (LOVENOX) injection  40 mg Subcutaneous Q24H  . famotidine  20 mg Oral Daily  . feeding supplement (ENSURE ENLIVE)  237 mL Oral QID  . insulin aspart  0-9 Units Subcutaneous TID WC  . QUEtiapine  50 mg Oral QHS   Continuous Infusions: . sodium chloride 50 mL/hr at 12/08/19 2134     Principal Problem:   AMS (altered mental status) Active Problems:   Essential hypertension   CKD (chronic kidney disease), stage III   Bilateral carotid artery stenosis   History of CVA with residual deficit   Controlled type 2 diabetes mellitus with hyperglycemia, without long-term current use of insulin (HCC)   LOS: 1 day   A & P   AMS, uncertain etiology: Patient with history of CVA in 05/2019 with waxing and waning MS since then.  History is difficult to obtain from his wife and daughter, but it appears that he was still able to eat full meals as of last week and could ambulate short distances with a walker. For the last 3-4 days, he has been incontinent of urine, non-ambulatory, and refusing to eat or drink. This is in the setting of apparent paranoia and AV hallucinations and possibly progressive debility. The most likely scenarios appear to be: subacute, recurrent CVA; seizures; or rapidly progressive dementia (likely Lewy Body) that was unmasked with his prior CVA.  There does not appear to be medical cause for his presentation other than above, including UTI. Given his resistance to movement on exam "cogwheel rigidity" the diagnosis of Lewy Body dementia makes some sense. I appreciate neurology's assistance. He has been evaluated by PT/OT/ST. Recommendation is for SNF placement.   Hallucinations/paranoia: This is consistent with presentation of Lewy Body Dementia. Seroquel has been substituted for xanax.    H/o CVA: He was last seen by the Neurology NP on 3/15/2. At that visit, the patient's wife and daughter reported worsening cognition with confusion. For prior CVA, he continues to take Plavix daily.  Hypertension: Even if currently AMS is due to CVA, his symptoms have been present for at least 3 days and so he is out of the window for permissive HTN. Atenolol and amlodipine have been restarted.  Hyperlipidemia: LDL 104. Continue the patient on Lipitor 40 mg daily.  DM  II: Will recheck. Recent A1c shows good control (5.8 in 05/2019). Oral antihyperglycemics have been held. His glucoses will be followed with FSBS and SSI. He has been started on a full liquid diet as recommended by SLP.  B carotid stenosis: R and L ICA 60-79% stenosis. He last saw Dr. Donnetta Hutching in 08/2019 with plan for close f/u in another 6 months. However, if CVA was thought to be related to carotid stenosis, endarterectomy would need to be considered.  Stage 3b-4 CKD: Appears to be stable at this time. Monitor creatinine, electrolytes, and volume status. Avoid nephrotoxic substances and   I have seen and examined this patient myself. I have spent 48 minutes in his evaluation and care.  DVT prophylaxis:Lovenox  Code Status:DNR Family Communication:I discussed the patient in detail with his daughter over the phone. All questions answered to the best of my ability. Disposition Plan:Unless he has significant neurologic  recovery, he is very likely to require at least short-term SNF placement  Joss Friedel, DO [facility] Direct contact: see [email or web address]  7PM-7AM contact night coverage as above 12/09/2019, 4:49 PM  LOS: 1 day

## 2019-12-09 NOTE — Evaluation (Signed)
Occupational Therapy Evaluation Patient Details Name: [name] MRN: [id number] DOB: 1935-10-13 Today's Date: 12/09/2019    History of Present Illness presenting after 3-4 days of increasing somunulence, blank stares, incontinence, and uncooperative behaviors. 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   Clinical Impression   PT admitted with behavior changes . Pt currently with functional limitiations due to the deficits listed below (see OT problem list). Wife reports with extended questions that increased hallucinations have started more in the evening time. Pt currently total +2 mod to total (A) for all care. Recommend order for repositioning every 2 hours or air mattress overlay to prevent skin break down. Pt does not reposition himself. Pt will benefit from skilled OT to increase their independence and safety with adls and balance to allow discharge SNF pending ability to maximize (A) to wife to return home. Recommending palliative consult as wife reports patient has been producing less urine output and not eating. Pt normally voids 3-4 times at night and now going 8 hours at times without voiding.     Follow Up Recommendations  SNF;Supervision/Assistance - 24 hour    Equipment Recommendations  Hospital bed;Other (comment)(hoyer lift)    Recommendations for Other Services Speech consult;Other (comment)(palliative- recommend soft approach to wife.(tearful now))     Precautions / Restrictions Precautions Precautions: Fall Precaution Comments: reports x3 months of hallunications and more in the evening time starting      Mobility Bed Mobility Overal bed mobility: Needs Assistance Bed Mobility: Rolling;Supine to Sit;Sit to Supine Rolling: Mod assist   Supine to sit: +2 for physical assistance;Mod assist Sit to supine: +2 for physical assistance;Mod assist   General bed mobility comments: pt  requires (A) to facilitate bed mobility due to no initiation by pt to request. pt once EOB sitting min guard to min . Pt sustained EOb sitting throughout session.   Transfers Overall transfer level: Needs assistance Equipment used: 2 person hand held assist Transfers: Sit to/from Stand Sit to Stand: +2 physical assistance;Mod assist         General transfer comment: elevated surface to come to feet. pt completed sit<>Stand x5 during session. pt unable to sustain standing and unable to step on command    Balance                                           ADL either performed or assessed with clinical judgement   ADL Overall ADL's : Needs assistance/impaired Eating/Feeding: NPO   Grooming: Total assistance Grooming Details (indicate cue type and reason): pt presented with oral care items and just holding staring blank straight ahead. pt provided hand over hand (A) and refusing to accept brush to face. pt moving face closing mouth and displaying strong dislike for task     Lower Body Bathing: Total assistance Lower Body Bathing Details (indicate cue type and reason): incontinence of bowel noted and no awareness. pt is high riskfor skin break down.      Lower Body Dressing: Total assistance Lower Body Dressing Details (indicate cue type and reason): don socks Toilet Transfer: +2 for physical assistance;Moderate assistance Toilet Transfer Details (indicate cue type and reason): elevated surface to stand at EOB. simulated task           General ADL Comments: pt sitting eob min guard (A) but  does not follow commands. pt states "cold chill" and noted to shake. wife reports "he is cold all the time". Pt reports no pain and pleasantly cooperative.     Vision Baseline Vision/History: Wears glasses Wears Glasses: At all times Vision Assessment?: No apparent visual deficits Additional Comments: wife reports that vision has been so strong that he will say a pill is on  the floor even when its just a piece of lent. Pt using central vision during session with minimal head turns to track staff     Perception     Praxis      Pertinent Vitals/Pain Pain Assessment: No/denies pain     Hand Dominance Right   Extremity/Trunk Assessment Upper Extremity Assessment Upper Extremity Assessment: RUE deficits/detail;LUE deficits/detail RUE Deficits / Details: able to move R UE WFL however does not engage in a functional task when presented. pt does reach out to hold therapist and the IV pole appropriately with standing LUE Deficits / Details: baseline contracture 90 degrees but functionally able to use prior to admission even after stroke. pt known to therapist at that time. pt does not functionally attempt to use it this session   Lower Extremity Assessment Lower Extremity Assessment: Defer to PT evaluation   Cervical / Trunk Assessment Cervical / Trunk Assessment: Kyphotic   Communication Communication Communication: Expressive difficulties   Cognition Arousal/Alertness: Awake/alert Behavior During Therapy: Flat affect Overall Cognitive Status: Difficult to assess                                 General Comments: pt answers name to wife but then repeats name as answer to other questions. Pt making statements in session that are not related . pt giving job description for someone that isnt part of conversation and making statements about "basement" pt social cues more appropriate by saying "bye " back to therapist when she waves   General Comments  wife reports lowering patient to the floor overweekend and neighbors helping get patient up off the floor.     Exercises     Shoulder Instructions      Home Living Family/patient expects to be discharged to:: Skilled nursing facility                                 Additional Comments: pt has been at home with wife and during session daughter kept reminding wife "see momma it  takes two of them. you cant do that"       Prior Functioning/Environment Level of Independence: Needs assistance  Gait / Transfers Assistance Needed: walking with walker with wife (A) min guard ADL's / Homemaking Assistance Needed: requires (A) with shower transfer but able to complete washing face, grooming task including shaving and oral care for dentures. Pt was able to make request for help "can you wash off my dentures" until Wednesady March 24 Communication / Swallowing Assistance Needed: family reports strong appetite and self feeding until just prior to admission. pt refusing all fluid and food          OT Problem List: Decreased strength;Decreased activity tolerance;Impaired balance (sitting and/or standing);Decreased cognition;Decreased safety awareness;Decreased coordination;Decreased knowledge of use of DME or AE;Decreased knowledge of precautions;Obesity;Impaired UE functional use      OT Treatment/Interventions: Self-care/ADL training;Therapeutic exercise;Neuromuscular education;Energy conservation;DME and/or AE instruction;Manual therapy;Therapeutic activities;Cognitive remediation/compensation;Visual/perceptual remediation/compensation;Patient/family education;Balance training    OT Goals(Current  goals can be found in the care plan section) Acute Rehab OT Goals Patient Stated Goal: none stated, wife wants to get patient back home and be able to take him to appointments like before OT Goal Formulation: With family Time For Goal Achievement: 12/23/19 Potential to Achieve Goals: Good  OT Frequency: Min 2X/week   Barriers to D/C:            Co-evaluation PT/OT/SLP Co-Evaluation/Treatment: Yes Reason for Co-Treatment: Complexity of the patient's impairments (multi-system involvement);Necessary to address cognition/behavior during functional activity;For patient/therapist safety;To address functional/ADL transfers   OT goals addressed during session: ADL's and  self-care;Proper use of Adaptive equipment and DME;Strengthening/ROM      AM-PAC OT "6 Clicks" Daily Activity     Outcome Measure Help from another person eating meals?: Total Help from another person taking care of personal grooming?: Total Help from another person toileting, which includes using toliet, bedpan, or urinal?: Total Help from another person bathing (including washing, rinsing, drying)?: Total Help from another person to put on and taking off regular upper body clothing?: Total Help from another person to put on and taking off regular lower body clothing?: Total 6 Click Score: 6   End of Session Equipment Utilized During Treatment: Gait belt Nurse Communication: Mobility status;Precautions  Activity Tolerance: Patient tolerated treatment well Patient left: in bed;with call bell/phone within reach;with family/visitor present;with bed alarm set  OT Visit Diagnosis: Unsteadiness on feet (R26.81);Muscle weakness (generalized) (M62.81);Cognitive communication deficit (R41.841)                Time: 4562-5638 OT Time Calculation (min): 42 min Charges:  OT General Charges $OT Visit: 1 Visit OT Evaluation $OT Eval Moderate Complexity: 1 Mod OT Treatments $Self Care/Home Management : 8-22 mins   Brynn, OTR/L  Acute Rehabilitation Services Pager: [phone] Office: [phone] .   Jeri Modena 12/09/2019, 2:44 PM

## 2019-12-09 NOTE — Progress Notes (Signed)
EEG complete - results pending 

## 2019-12-09 NOTE — Progress Notes (Signed)
Initial Nutrition Assessment  DOCUMENTATION CODES:   Not applicable  INTERVENTION:  Ensure Enlive po QID, each supplement provides 350 kcal and 20 grams of protein    NUTRITION DIAGNOSIS:   Inadequate oral intake related to lethargy/confusion as evidenced by per patient/family report.   GOAL:   Patient will meet greater than or equal to 90% of their needs    MONITOR:   PO intake, Diet advancement, Supplement acceptance, Weight trends, Labs  REASON FOR ASSESSMENT:   Consult Other (Comment)(CVA)  ASSESSMENT:   Pt with a PMH significant for DM, CAD, HTN, recent stroke with residual left hemiparesis presented with worsening cognition since his last stroke in September 2020.  EEG today suggestive of moderate diffuse encephalopathy, negative for seizure.   Discussed pt with RN.  Pt sleeping at time of RD visit and did not awaken to RD voice.  Per H&P, pt's wife reported pt has been resisting to eat over the last few days.   No PO intake recorded yet, pt previously NPO.   Labs reviewed: CBGs  82-99 Medications reviewed and include: Pepcid, SSI  NUTRITION - FOCUSED PHYSICAL EXAM:    Most Recent Value  Orbital Region  No depletion  Upper Arm Region  No depletion  Thoracic and Lumbar Region  No depletion  Buccal Region  No depletion  Temple Region  No depletion  Clavicle Bone Region  No depletion  Clavicle and Acromion Bone Region  No depletion  Scapular Bone Region  No depletion  Dorsal Hand  No depletion  Patellar Region  No depletion  Anterior Thigh Region  No depletion  Posterior Calf Region  No depletion  Edema (RD Assessment)  Mild  Hair  Reviewed  Eyes  Reviewed  Mouth  Reviewed  Skin  Reviewed  Nails  Reviewed       Diet Order:   Diet Order            Diet full liquid Room service appropriate? No; Fluid consistency: Thin  Diet effective now              EDUCATION NEEDS:   Not appropriate for education at this time  Skin:  Skin  Assessment: Reviewed RN Assessment  Last BM:  PTA  Height:   Ht Readings from Last 1 Encounters:  08/13/19 5\' 10"  (1.778 m)    Weight:   Wt Readings from Last 9 Encounters:  09/11/19 85.3 kg  08/13/19 82.1 kg  08/01/19 82.3 kg  07/10/19 83 kg  06/24/19 82.7 kg  06/10/19 84.6 kg  06/04/19 80 kg  11/22/11 83.9 kg    BMI:  There is no height or weight on file to calculate BMI.  Estimated Nutritional Needs:   Kcal:  1900-2100  Protein:  95-110 grams  Fluid:  >/= 1.9 L/d    Larkin Ina, MS, RD, LDN RD pager number and weekend/on-call pager number located in [place].

## 2019-12-09 NOTE — Evaluation (Signed)
Physical Therapy Evaluation Patient Details Name: [name] MRN: [id number] DOB: 1936/08/17 Today's Date: 12/09/2019   History of Present Illness  Pt is an 84 yo male presenting after 3-4 days of increasing somunulence, blank stares, incontinence, and uncooperative behaviors. 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  Clinical Impression  Pt in bed upon arrival of PT, agreeable to evaluation at this time. Prior to admission the pt was mobilizing with assist of his wife and use of RW, as well as receiving assist from his wife for all ADLs and mobility at home. However, reccently, the pt has become less responsive and cooperative with mobility and self-care, and presents with sig limitations in functional mobility, stability, and activity tolerance due to above dx, and will continue to benefit from skilled PT to address these deficits. The pt was able to demo multiple transfers during today's session with modA of 2 to maintain upright posture, but further mobility and challenge remained limited due to the pt's current reduced responsiveness and cooperation. The pt was able to answer some questions through session appropriately (oriented to self, denied pain, stated "bye" upon therapists leaving), but has limited engagement and poor command following otherwise. Although the wife has been doing a wonderful job as the caregiver thus far, the pt currently needs more assist than she alone can provide, and therefore I recommend d/c to SNF where the pt can receive appropriate assistance.      Follow Up Recommendations SNF;Supervision/Assistance - 24 hour    Equipment Recommendations  None recommended by PT(defer to post acute)    Recommendations for Other Services Other (comment)(palliative consult, RN notified)     Precautions / Restrictions Precautions Precautions: Fall Precaution Comments: reports x3 months of  hallunications and more in the evening time starting Restrictions Weight Bearing Restrictions: No      Mobility  Bed Mobility Overal bed mobility: Needs Assistance Bed Mobility: Rolling;Supine to Sit;Sit to Supine Rolling: Mod assist   Supine to sit: +2 for physical assistance;Mod assist Sit to supine: +2 for physical assistance;Mod assist   General bed mobility comments: pt requires (A) to facilitate bed mobility due to no initiation by pt to request. pt once EOB sitting min guard to minA. Pt sustained EOB sitting throughout session without LOB  Transfers Overall transfer level: Needs assistance Equipment used: 2 person hand held assist Transfers: Sit to/from Stand Sit to Stand: +2 physical assistance;Mod assist         General transfer comment: elevated surface to come to feet. pt completed sit<>Stand x5 during session. pt unable to sustain standing and unable to step on command  Ambulation/Gait Ambulation/Gait assistance: (pt unable to initiate lateral steps even with multimodal and repeated cues)              Stairs            Wheelchair Mobility    Modified Rankin (Stroke Patients Only)       Balance Overall balance assessment: Needs assistance Sitting-balance support: No upper extremity supported;Feet supported Sitting balance-Leahy Scale: Fair Sitting balance - Comments: initially requires minA, progressed to minG   Standing balance support: Bilateral upper extremity supported;During functional activity Standing balance-Leahy Scale: Poor Standing balance comment: requires BUE support and external support                             Pertinent Vitals/Pain Pain Assessment:  No/denies pain Faces Pain Scale: No hurt    Home Living Family/patient expects to be discharged to:: Skilled nursing facility   Available Help at Discharge: Family(pt has been living at home with wife as primary caretaker) Type of Home: House            Additional Comments: pt has been at home with wife and during session daughter kept reminding wife "see momma it takes two of them. you cant do that"     Prior Function Level of Independence: Needs assistance   Gait / Transfers Assistance Needed: walking with walker with wife (A) min guard  ADL's / Homemaking Assistance Needed: requires (A) with shower transfer but able to complete washing face, grooming task including shaving and oral care for dentures. Pt was able to make request for help "can you wash off my dentures" until Wednesady March 24        Hand Dominance   Dominant Hand: Right    Extremity/Trunk Assessment   Upper Extremity Assessment Upper Extremity Assessment: Defer to OT evaluation RUE Deficits / Details: able to move R UE WFL however does not engage in a functional task when presented. pt does reach out to hold therapist and the IV pole appropriately with standing LUE Deficits / Details: baseline contracture 90 degrees but functionally able to use prior to admission even after stroke. pt known to therapist at that time. pt does not functionally attempt to use it this session    Lower Extremity Assessment Lower Extremity Assessment: RLE deficits/detail;LLE deficits/detail;Difficult to assess due to impaired cognition RLE Deficits / Details: Able to use functionally but unable to complete MMT as pt is not responsive to commands regarding isolated movements, able to stand x4 through session with assist, but noted endurance deficits as pt asked to sit after ~10 sec of standing LLE Deficits / Details: Able to use functionally but unable to complete MMT as pt is not responsive to commands regarding isolated movements, able to stand x4 through session with assist, but noted endurance deficits as pt asked to sit after ~10 sec of standing    Cervical / Trunk Assessment Cervical / Trunk Assessment: Kyphotic  Communication   Communication: Expressive difficulties  Cognition  Arousal/Alertness: Awake/alert Behavior During Therapy: Flat affect Overall Cognitive Status: Difficult to assess                                 General Comments: pt answers name to wife but then repeats name as answer to other questions. Pt making statements in session that are not related . pt giving job description for someone that isnt part of conversation and making statements about "basement" pt social cues more appropriate by saying "bye " and waving back to therapist when she waves      General Comments General comments (skin integrity, edema, etc.): wife reports lowering patient to the floor overweekend and neighbors helping get patient up off the floor, sig decline in past ~4 days as well as general decreased motivation/engagement by pt.    Exercises     Assessment/Plan    PT Assessment Patient needs continued PT services  PT Problem List Decreased strength;Decreased mobility;Decreased safety awareness;Decreased range of motion;Decreased coordination;Decreased activity tolerance;Decreased cognition;Decreased balance;Decreased knowledge of use of DME       PT Treatment Interventions Therapeutic exercise;Gait training;Balance training;DME instruction;Functional mobility training;Therapeutic activities;Patient/family education;Cognitive remediation    PT Goals (Current goals can be found in  the Care Plan section)  Acute Rehab PT Goals Patient Stated Goal: none stated, wife wants to get patient back home and be able to take him to appointments like before PT Goal Formulation: With patient/family Time For Goal Achievement: 12/23/19 Potential to Achieve Goals: Fair    Frequency Min 2X/week   Barriers to discharge   pt wife has been caregiver for past 5 months, currently needs more assistance than she can provide    Co-evaluation PT/OT/SLP Co-Evaluation/Treatment: Yes Reason for Co-Treatment: Complexity of the patient's impairments (multi-system  involvement);Necessary to address cognition/behavior during functional activity;For patient/therapist safety;To address functional/ADL transfers PT goals addressed during session: Mobility/safety with mobility;Balance;Strengthening/ROM OT goals addressed during session: ADL's and self-care;Proper use of Adaptive equipment and DME;Strengthening/ROM       AM-PAC PT "6 Clicks" Mobility  Outcome Measure Help needed turning from your back to your side while in a flat bed without using bedrails?: A Little Help needed moving from lying on your back to sitting on the side of a flat bed without using bedrails?: A Little Help needed moving to and from a bed to a chair (including a wheelchair)?: Total Help needed standing up from a chair using your arms (e.g., wheelchair or bedside chair)?: A Little Help needed to walk in hospital room?: Total Help needed climbing 3-5 steps with a railing? : Total 6 Click Score: 12    End of Session Equipment Utilized During Treatment: Gait belt Activity Tolerance: Patient tolerated treatment well;Other (comment)(progression of mobility limited by poor command followig/engagement) Patient left: in bed;with family/visitor present;with call bell/phone within reach Nurse Communication: Mobility status(need for palliative consult) PT Visit Diagnosis: History of falling (Z91.81);Muscle weakness (generalized) (M62.81);Difficulty in walking, not elsewhere classified (R26.2)    Time: 1030-1314 PT Time Calculation (min) (ACUTE ONLY): 45 min   Charges:   PT Evaluation $PT Eval Moderate Complexity: 1 Mod          Karma Ganja, PT, DPT   Acute Rehabilitation Department Pager #: [phone]  Otho Bellows 12/09/2019, 3:37 PM

## 2019-12-09 NOTE — Procedures (Signed)
Patient Name: [name]  MRN: [id number]  Epilepsy Attending: Lora Havens  Referring Physician/Provider: Dr. Karmen Bongo Date: 12/09/2019 Duration: 24.30 minutes  Patient history: 84 year old male with history of stroke in September 2020 with residual left hemiparesis came in for evaluation of worsening cognition.  EEG to evaluate for seizures.  Level of alertness: Awake  AEDs during EEG study: None  Technical aspects: This EEG study was done with scalp electrodes positioned according to the 10-20 International system of electrode placement. Electrical activity was acquired at a sampling rate of 500Hz  and reviewed with a high frequency filter of 70Hz  and a low frequency filter of 1Hz . EEG data were recorded continuously and digitally stored.   Description: During awake state, no clear posterior dominant was seen.  EEG showed continuous generalized 3 to 5 Hz theta-delta slowing at times with triphasic morphology.  Hyperventilation photic summation were not performed.  Per EEG tech annotation, at around 1033 patient was noted to be staring off and not responding to questions.  Concomitant EEG before, during and after the event showed generalized slowing without any seizure activity.  Abnormality -Continuous slow, generalized  IMPRESSION: This study is suggestive of moderate diffuse encephalopathy, nonspecific etiology. No seizures or epileptiform discharges were seen throughout the recording.  Per EEG tech annotation, 1 episode of staring off was recorded at 1033 without any EEG change and was nonepileptic.

## 2019-12-10 LAB — GLUCOSE, CAPILLARY
Glucose-Capillary: 147 mg/dL — ABNORMAL HIGH (ref 70–99)
Glucose-Capillary: 162 mg/dL — ABNORMAL HIGH (ref 70–99)
Glucose-Capillary: 284 mg/dL — ABNORMAL HIGH (ref 70–99)
Glucose-Capillary: 200 mg/dL — ABNORMAL HIGH (ref 70–99)
Glucose-Capillary: 179 mg/dL — ABNORMAL HIGH (ref 70–99)

## 2019-12-10 LAB — THEOPHYLLINE LEVEL: Theophylline Lvl: 6.9 ug/mL — ABNORMAL LOW (ref 10.0–20.0)

## 2019-12-10 MED ORDER — ENOXAPARIN SODIUM 30 MG/0.3ML ~~LOC~~ SOLN
30.0000 mg | SUBCUTANEOUS | Status: DC
  Administered 2019-12-10 – 2019-12-12 (×3): 30 mg via SUBCUTANEOUS
  Filled 2019-12-10 (×3): qty 0.3

## 2019-12-10 NOTE — Progress Notes (Signed)
Pt was unable to void for 8 hrs.  Bladder scan and noted 506 ml MD on call notified Pt later voided 150 mils posterior voidal residual  474 in/out cath x 1 done. 450 mls drain out. Post straight cath residual was 0 ml pt tolerated procedure well. Will continue to monitor pt's output.

## 2019-12-10 NOTE — TOC Initial Note (Signed)
Transition of Care [facility]) - Initial/Assessment Note    Patient Details  Name: [name] MRN: [id number] Date of Birth: 11-Aug-1936  [facility] of [facility]) CM/SW Contact:    Geralynn Ochs, LCSW Phone Number: 12/10/2019, 12:49 PM  Clinical Narrative:   CSW acknowledging consult for SNF placement and family preference for either [facility] or [facility]. CSW sent referral, and patient has been denied at [facility] but received bed offer at [facility]. CSW contacted patient's family to confirm preference and update on placement progress. CSW asked family about whether or not they've considered long term care, as it seems per chart review that the family has been struggling to care for the patient recently. Per wife and daughter, the patient has had a slow and gradual decline over the past couple of months in addition to a very sharp decline over the past week. Wife indicated that the patient has been needing gradually more assistance over time, but prior to a week ago he was able to feed himself, walk with a walker, talk, brush his teeth, and use the urinal. However, starting about a week ago, the wife says it was almost overnight that he just stopped doing anything and would stare off into space. The family would love to get him into rehab so that he can get back to what he was able to do prior to a week ago and the wife can continue to care for him. CSW did discuss with them that if the patient does need long term care, that his Medicare will not cover that; they will only cover the rehabilitation. CSW discussed insurance coverage for rehab, as well.   Lengthy conversation was had with the family about their concerns that they don't want the patient just "dumped" in a nursing home and they want some answers about what's going on with the patient before they will agree to discharge. Family asked questions about what's wrong with the patient, what to expect in terms of prognosis, and what they  will need to be prepared for moving forward, and CSW asked if the family had talked with MD about those questions. Family specifically asked to speak with neurologist, Dr. Leonie Man. Per chart review, Dr. Leonie Man has not seen them this admission. CSW did send Dr. Leonie Man a message to try to speak with the patient if he is able, as well as sent a message to the attending that the family has questions that CSW cannot answer.   CSW confirmed bed availability at [facility]. CSW to fax off request for [facility] authorization for SNF. CSW to follow.   Expected Discharge Plan: Skilled Nursing Facility Barriers to Discharge: Continued Medical Work up, Ship broker   Patient Goals and CMS Choice Patient states their goals for this hospitalization and ongoing recovery are:: patient unable to participate in goal setting due to disorientation CMS Medicare.gov Compare Post Acute Care list provided to:: Patient Represenative (must comment) Choice offered to / list presented to : Spouse, Adult Children  Expected Discharge Plan and Services Expected Discharge Plan: [place] Choice: [place] arrangements for the past 2 months: Single Family Home                                      Prior Living Arrangements/Services Living arrangements for the past 2 months: Single Family Home Lives  with:: Spouse Patient language and need for interpreter reviewed:: No Do you feel safe going back to the place where you live?: Yes      Need for Family Participation in Patient Care: Yes (Comment) Care giver support system in place?: Yes (comment) Current home services: Home OT, Home PT Criminal Activity/Legal Involvement Pertinent to Current Situation/Hospitalization: No - Comment as needed  Activities of Daily Living      Permission Sought/Granted Permission sought to share information with : Facility Sport and exercise psychologist, Family  Supports Permission granted to share information with : Yes, Verbal Permission Granted  Share Information with NAME: Rachael Darby  Permission granted to share info w AGENCY: SNF  Permission granted to share info w Relationship: Spouse, Daughter     Emotional Assessment   Attitude/Demeanor/Rapport: Unable to Assess Affect (typically observed): Unable to Assess Orientation: : Oriented to Self Alcohol / Substance Use: Not Applicable Psych Involvement: No (comment)  Admission diagnosis:  Altered mental status, unspecified altered mental status type [R41.82] AMS (altered mental status) [R41.82] Patient Active Problem List   Diagnosis Date Noted  . AMS (altered mental status) 12/08/2019  . Primary osteoarthritis of left knee 07/10/2019  . Labile blood glucose   . Acute blood loss anemia   . Hypoalbuminemia due to protein-calorie malnutrition ([place])   . Controlled type 2 diabetes mellitus with hyperglycemia, without long-term current use of insulin ([place])   . Right thalamic infarction ([place]) 06/17/2019  . Bilateral hearing loss   . Coronary artery disease involving coronary bypass graft of native heart without angina pectoris   . Diabetes mellitus type 2 in nonobese (H[facility])   . Bilateral carotid artery stenosis   . History of CVA with residual deficit   . Essential hypertension 06/12/2019  . CKD (chronic kidney disease), stage III 06/12/2019  . CVA (cerebral vascular accident) ([place]) 06/11/2019  . Speech abnormality 06/10/2019   PCP:  [name], MD Pharmacy:   [place], [address] [place] [address] Phone: [phone] Fax: [phone]     Social Determinants of Health (SDOH) Interventions    Readmission Risk Interventions No flowsheet data found.

## 2019-12-10 NOTE — Progress Notes (Signed)
PROGRESS NOTE  [name] [name]:[id number] DOB: 09-11-1936 DOA: 12/08/2019 PCP: [name], MD  Brief History    [name] Sjogren is a 84 y.o. male with medical history significant of CVA; HTN; HLD; DM; CAD; prostate CA; and CKD presenting with AMS.  He was here in October for a stroke.  She reports "just a complete change."  She took him to neurology on 3/15.  The last 3 days, he has stopped eating, completely confused, not drinking, won't stand up, won't use the urinal. She reports that this is dramatically different from his prior, staring into space.  Symptoms started Wednesday and worsened Thursday.  He has chronic right-sided hemiplegia but this is unchanged.  He has been very confused over the last month but worse this week.  He would not release his dentures to her.  He is having AV hallucinations.  His daughter reports h/o CVA in Sept/Oct.  He had home therapy services.  He was able to get through the house, up/down the ramp, go places in the car.  There was a big difference in mid-January - "something happened."  He wasn't able to walk as well.  They needed the transport chair more and more.  He had a few days recently - stubborn, irritated, wouldn't participate in ADLs - and then returned to baseline.  On Wednesday, he started not talking, not eating.  His daughter came to relieve her mother on Thursday and he wouldn't eat well and was staring.  That night, he refused to participate.  He wouldn't take out his dentures or hearing aids.    Apparent gradual cognitive decline - ?Lewy body dementia.  Needs r/o for seizures, stroke.  May not be as acute as presented, may need placement.  [facility] were consulted to admit the patient for further evaluation and treatment.  The patient was admitted to a telemetry bed. Neurology was consulted. The patient underwent EEG which demonstrated a moderate diffuse encephalopathy of non-specific etiology. No seizure or epileptiform  discharge were seen. MRI Brain demonstrated no acute intracranial abnormality and moderate chronic small vessel ischemic disease and cerebral atrophy. He has been evaluated by PT/OT. Their recommendation is for CIR.  As per neurology's recommendation, psychiatry has been consulted, but is unable to complete consult as the patient is unable to cooperate with consult currently. Will reconsult if the situation changes.  Consultants  . Neurology . Psychiatry  Procedures  . None  Subjective  The patient is resting comfortably in his bed. He is not verbal for me.  Objective   Vitals:  Vitals:   12/10/19 1229 12/10/19 1328  BP: (!) 191/69 (!) 154/58  Pulse: 74 73  Resp: 18   Temp: 98.8 F (37.1 C) 98.3 F (36.8 C)  SpO2: 98% 97%   Exam:  Constitutional:  . The patient is awake and alert. No speech and he does not follow commands. Respiratory:  . No increased work of breathing. . No wheezes, rales, or rhonchi . No tactile fremitus Cardiovascular:  . Regular rate and rhythm . No murmurs, ectopy, or gallups. . No lateral PMI. No thrills. Abdomen:  . Abdomen is soft, non-tender, non-distended . No hernias, masses, or organomegaly . Normoactive bowel sounds.  Musculoskeletal:  . No cyanosis, clubbing, or edema Skin:  . No rashes, lesions, ulcers . palpation of skin: no induration or nodules Neurologic:  . CN 2-12 intact . Sensation all 4 extremities intact Psychiatric:  . Mental status o Mood, affect appropriate o Orientation  to person, place, time  . judgment and insight appear intact  I have personally reviewed the following:   Today's Data  . Vitals, serum theophylline, glucoses .  Imaging  . MRI Brain . CT Brain  Other Data  . EEG  Scheduled Meds: . allopurinol  300 mg Oral QHS  . amLODipine  5 mg Oral q morning - 10a  . atenolol  50 mg Oral Daily  . atorvastatin  20 mg Oral QHS  . clopidogrel  75 mg Oral Daily  . enoxaparin (LOVENOX) injection  30  mg Subcutaneous Q24H  . famotidine  20 mg Oral Daily  . feeding supplement (ENSURE ENLIVE)  237 mL Oral QID  . insulin aspart  0-9 Units Subcutaneous TID WC  . QUEtiapine  50 mg Oral QHS   Continuous Infusions: . sodium chloride 50 mL/hr at 12/10/19 1535    Principal Problem:   AMS (altered mental status) Active Problems:   Essential hypertension   CKD (chronic kidney disease), stage III   Bilateral carotid artery stenosis   History of CVA with residual deficit   Controlled type 2 diabetes mellitus with hyperglycemia, without long-term current use of insulin (HCC)   LOS: 2 days   A & P   AMS, uncertain etiology: Patient with history of CVA in 05/2019 with waxing and waning MS since then.  History is difficult to obtain from his wife and daughter, but it appears that he was still able to eat full meals as of last week and could ambulate short distances with a walker. For the last 3-4 days, he has been incontinent of urine, non-ambulatory, and refusing to eat or drink. This is in the setting of apparent paranoia and AV hallucinations and possibly progressive debility. The most likely scenarios appear to be: subacute, recurrent CVA; seizures; or rapidly progressive dementia (likely Lewy Body) that was unmasked with his prior CVA.  There does not appear to be medical cause for his presentation other than above, including UTI. Given his resistance to movement on exam "cogwheel rigidity" the diagnosis of Lewy Body dementia makes some sense. I appreciate neurology's assistance. He has been evaluated by PT/OT/ST. Recommendation is for SNF placement.   Hallucinations/paranoia: This is consistent with presentation of Lewy Body Dementia. Seroquel has been substituted for xanax. Psychiatry was consulted due to neurology's recommendation. They were unable to perform consult due to the patient's inability to cooperate with exam.  H/O CVA: He was last seen by the Neurology NP on 3/15/2. At that visit, the  patient's wife and daughter reported worsening cognition with confusion. For prior CVA, he continues to take Plavix daily.  Hypertension: Even if currently AMS is due to CVA, his symptoms have been present for at least 3 days and so he is out of the window for permissive HTN. Atenolol and amlodipine have been restarted.  Hyperlipidemia: LDL 104. Continue the patient on Lipitor 40 mg daily.  DM II: Will recheck. Recent A1c shows good control (5.8 in 05/2019). Oral antihyperglycemics have been held. His glucoses will be followed with FSBS and SSI. He has been started on a full liquid diet as recommended by SLP.  B carotid stenosis: R and L ICA 60-79% stenosis. He last saw Dr. Donnetta Hutching in 08/2019 with plan for close f/u in another 6 months. However, if CVA was thought to be related to carotid stenosis, endarterectomy would need to be considered.  Stage 3b-4 CKD: Appears to be stable at this time. Monitor creatinine, electrolytes, and volume  status. Avoid nephrotoxic substances and hypotension.  I have seen and examined this patient myself. I have spent 40 minutes in his evaluation and care.  DVT prophylaxis:Lovenox  Code Status:DNR Family Communication:None today. Neurology will see the patient and communicate with the family as they have requested. Disposition Plan:Unless he has significant neurologic recovery, he is very likely to require at least short-term SNF placement  Elliyah Liszewski, DO [facility] Direct contact: see [email or web address]  7PM-7AM contact night coverage as above 12/10/2019, 4:43 PM  LOS: 1 day

## 2019-12-10 NOTE — Plan of Care (Signed)
  Problem: Nutrition: Goal: Adequate nutrition will be maintained Outcome: Progressing   

## 2019-12-10 NOTE — NC FL2 (Signed)
[place] MEDICAID FL2 LEVEL OF CARE SCREENING TOOL     IDENTIFICATION  Patient Name: [name] Name Birthdate: Apr 28, 1936 Sex: male Admission Date (Current Location): 12/08/2019  [facility] and [place] Number:  [employer] and Address:  [facility] [place], [place] [address], [place], [place] [address]      Provider Number: [id number]  Attending Physician Name and Address:  Karie Kirks, DO  Relative Name and Phone Number:       Current Level of Care: Hospital Recommended Level of Care: [place] Prior Approval Number:    Date Approved/Denied:   PASRR Number: [id number]  Discharge Plan: SNF    Current Diagnoses: Patient Active Problem List   Diagnosis Date Noted  . AMS (altered mental status) 12/08/2019  . Primary osteoarthritis of left knee 07/10/2019  . Labile blood glucose   . Acute blood loss anemia   . Hypoalbuminemia due to protein-calorie malnutrition ([place])   . Controlled type 2 diabetes mellitus with hyperglycemia, without long-term current use of insulin ([place])   . Right thalamic infarction ([place]) 06/17/2019  . Bilateral hearing loss   . Coronary artery disease involving coronary bypass graft of native heart without angina pectoris   . Diabetes mellitus type 2 in nonobese (HCC)   . Bilateral carotid artery stenosis   . History of CVA with residual deficit   . Essential hypertension 06/12/2019  . CKD (chronic kidney disease), stage III 06/12/2019  . CVA (cerebral vascular accident) ([place]) 06/11/2019  . Speech abnormality 06/10/2019    Orientation RESPIRATION BLADDER Height & Weight     Self  Normal Incontinent Weight: 175 lb 4.3 oz (79.5 kg) Height:     BEHAVIORAL SYMPTOMS/MOOD NEUROLOGICAL BOWEL NUTRITION STATUS      Incontinent Diet(see DC summary)  AMBULATORY STATUS COMMUNICATION OF NEEDS Skin   Extensive Assist Verbally Normal                       Personal Care Assistance Level of Assistance   Bathing, Feeding, Dressing Bathing Assistance: Maximum assistance Feeding assistance: Limited assistance Dressing Assistance: Maximum assistance     Functional Limitations Info  Speech     Speech Info: Impaired    SPECIAL CARE FACTORS FREQUENCY  PT (By licensed PT), OT (By licensed OT), Speech therapy     PT Frequency: 5x/wk OT Frequency: 5x/wk     Speech Therapy Frequency: 5x/wk      Contractures Contractures Info: Not present    Additional Factors Info  Code Status, Allergies, Psychotropic, Insulin Sliding Scale Code Status Info: DNR Allergies Info: NSAIDs Psychotropic Info: Seroquel 50mg  daily Insulin Sliding Scale Info: 0-9 units 3x/day with meals       Current Medications (12/10/2019):  This is the current hospital active medication list Current Facility-Administered Medications  Medication Dose Route Frequency Provider Last Rate Last Admin  . 0.9 %  sodium chloride infusion   Intravenous Continuous Karmen Bongo, MD 50 mL/hr at 12/08/19 2134 Restarted at 12/08/19 2134  . acetaminophen (TYLENOL) tablet 650 mg  650 mg Oral Q4H PRN Karmen Bongo, MD       Or  . acetaminophen (TYLENOL) 160 MG/5ML solution 650 mg  650 mg Per Tube Q4H PRN Karmen Bongo, MD       Or  . acetaminophen (TYLENOL) suppository 650 mg  650 mg Rectal Q4H PRN Karmen Bongo, MD      . albuterol (PROVENTIL) (2.5 MG/3ML) 0.083% nebulizer solution 2.5 mg  2.5  mg Inhalation Q6H PRN Karmen Bongo, MD      . allopurinol (ZYLOPRIM) tablet 300 mg  300 mg Oral Ivery Quale, MD   300 mg at 12/09/19 2132  . amLODipine (NORVASC) tablet 5 mg  5 mg Oral q morning - 10a Karmen Bongo, MD   5 mg at 12/10/19 1950  . atenolol (TENORMIN) tablet 50 mg  50 mg Oral Daily Karmen Bongo, MD   50 mg at 12/10/19 0956  . atorvastatin (LIPITOR) tablet 20 mg  20 mg Oral QHS Karmen Bongo, MD   20 mg at 12/09/19 2132  . clopidogrel (PLAVIX) tablet 75 mg  75 mg Oral Daily Karmen Bongo, MD   75 mg at  12/10/19 0957  . enoxaparin (LOVENOX) injection 40 mg  40 mg Subcutaneous Q24H Karmen Bongo, MD   40 mg at 12/09/19 1620  . famotidine (PEPCID) tablet 20 mg  20 mg Oral Daily Karmen Bongo, MD   20 mg at 12/10/19 0958  . feeding supplement (ENSURE ENLIVE) (ENSURE ENLIVE) liquid 237 mL  237 mL Oral QID Swayze, Ava, DO   237 mL at 12/10/19 0958  . insulin aspart (novoLOG) injection 0-9 Units  0-9 Units Subcutaneous TID WC Karmen Bongo, MD   1 Units at 12/10/19 [phone]  . labetalol (NORMODYNE) injection 10 mg  10 mg Intravenous Q4H PRN Bodenheimer, Charles A, NP   10 mg at 12/10/19 0937  . QUEtiapine (SEROQUEL) tablet 50 mg  50 mg Oral Ivery Quale, MD   50 mg at 12/09/19 2132  . senna-docusate (Senokot-S) tablet 1 tablet  1 tablet Oral QHS PRN Karmen Bongo, MD       Facility-Administered Medications Ordered in Other Encounters  Medication Dose Route Frequency Provider Last Rate Last Admin  . fentaNYL (SUBLIMAZE) injection 25-50 mcg  25-50 mcg Intravenous Q5 min PRN Lerry Liner, MD         Discharge Medications: Please see discharge summary for a list of discharge medications.  Relevant Imaging Results:  Relevant Lab Results:   Additional Information SS#: [id number]  Geralynn Ochs, LCSW

## 2019-12-10 NOTE — Consult Note (Signed)
  Psychiatry consult ordered related to "altered mental status."  Patient alert to self only at this time per attending RN.  Attempted telemedicine psychiatric consult, patient unable to participate in assessment at this time. Per medical record patient does not appear to have psychiatric history.  Discussed with attending MD who will reconsult psychiatry if needed.

## 2019-12-10 NOTE — Progress Notes (Signed)
2 mg IV lobetalol administered to patient at 0939 for elevated blood pressure of 203/75. MD notified of change in patient vital sign. Will continue to monitor.

## 2019-12-11 ENCOUNTER — Encounter: Payer: Medicare Other | Admitting: Physical Medicine & Rehabilitation

## 2019-12-11 DIAGNOSIS — R5381 Other malaise: Secondary | ICD-10-CM

## 2019-12-11 DIAGNOSIS — R627 Adult failure to thrive: Secondary | ICD-10-CM

## 2019-12-11 DIAGNOSIS — N184 Chronic kidney disease, stage 4 (severe): Secondary | ICD-10-CM

## 2019-12-11 DIAGNOSIS — Z515 Encounter for palliative care: Secondary | ICD-10-CM

## 2019-12-11 DIAGNOSIS — Z66 Do not resuscitate: Secondary | ICD-10-CM

## 2019-12-11 LAB — GLUCOSE, CAPILLARY
Glucose-Capillary: 157 mg/dL — ABNORMAL HIGH (ref 70–99)
Glucose-Capillary: 138 mg/dL — ABNORMAL HIGH (ref 70–99)
Glucose-Capillary: 285 mg/dL — ABNORMAL HIGH (ref 70–99)
Glucose-Capillary: 211 mg/dL — ABNORMAL HIGH (ref 70–99)
Glucose-Capillary: 160 mg/dL — ABNORMAL HIGH (ref 70–99)

## 2019-12-11 LAB — SARS CORONAVIRUS 2 (TAT 6-24 HRS): SARS Coronavirus 2: NEGATIVE

## 2019-12-11 MED ORDER — NYSTATIN 100000 UNIT/GM EX CREA
TOPICAL_CREAM | Freq: Two times a day (BID) | CUTANEOUS | Status: DC
  Administered 2019-12-12 – 2019-12-13 (×2): 1 via TOPICAL
  Filled 2019-12-11: qty 15

## 2019-12-11 MED ORDER — CHLORHEXIDINE GLUCONATE 0.12 % MT SOLN
15.0000 mL | Freq: Two times a day (BID) | OROMUCOSAL | Status: DC
  Administered 2019-12-11 – 2019-12-13 (×4): 15 mL via OROMUCOSAL
  Filled 2019-12-11 (×5): qty 15

## 2019-12-11 MED ORDER — WHITE PETROLATUM EX OINT
TOPICAL_OINTMENT | CUTANEOUS | Status: AC
  Administered 2019-12-11: 0.2
  Filled 2019-12-11: qty 28.35

## 2019-12-11 NOTE — TOC Progression Note (Signed)
Transition of Care [facility]) - Progression Note    Patient Details  Name: [name] MRN: [id number] Date of Birth: 1936-06-06  [facility] CM/SW [place], [place] Phone Number: 12/11/2019, 11:32 AM  Clinical Narrative:   CSW received insurance authorization for patient for SNF this morning, but patient is not stable for DC at this time. CSW received update from palliative NP that family is wanting comfort, and will monitor intake for the next 24 hours to determine if patient is residential hospice appropriate. CSW to follow.    Expected Discharge Plan: [place] Barriers to Discharge: Continued Medical Work up, Ship broker  Expected Discharge Plan and Services Expected Discharge Plan: [place] Choice: [place] arrangements for the past 2 months: Single Family Home                                       Social Determinants of Health (SDOH) Interventions    Readmission Risk Interventions No flowsheet data found.

## 2019-12-11 NOTE — Progress Notes (Signed)
Physical Therapy Treatment Patient Details Name: [name] MRN: [id number] DOB: [birth year] Today's Date: 12/11/2019    History of Present Illness Pt is an 84 yo male presenting after 3-4 days of increasing somunulence, blank stares, incontinence, and uncooperative behaviors. Pt undergoing continued workup for Lewy Body Dementia. 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    PT Comments    Pt in bed upon arrival of PT/OT, agreeable to session with focus on progressing participation in functional mobility and stability with self-care tasks. The pt continues to present with limitations in functional mobility, strength, and coordination compared to their prior level of function and independence due to above dx and continuing cog deficits. The pt was able to demo good stability with seated balance, but had sig difficulty with attempts to transfer today and was unsuccessful despite maxA of 2. This is likely due to processing difficulties more so than sig decline in strength since last PT session. The pt will continue to benefit from skilled PT to reduce caregiver burden for pt care and ADLs.     Follow Up Recommendations  SNF;Supervision/Assistance - 24 hour     Equipment Recommendations  None recommended by PT(defer to post acute)    Recommendations for Other Services       Precautions / Restrictions Precautions Precautions: Fall Precaution Comments: wife reports x3 months of hallunications and more in the evening time starting Restrictions Weight Bearing Restrictions: No    Mobility  Bed Mobility Overal bed mobility: Needs Assistance Bed Mobility: Rolling;Supine to Sit;Sit to Supine Rolling: Mod assist   Supine to sit: Mod assist Sit to supine: +2 for physical assistance;Mod assist   General bed mobility comments: pt requires mod assist to facilitate bed mobility due to no initiation by pt to  request. pt once EOB sitting min guard to minA. Pt sustained EOB sitting throughout session without LOB  Transfers Overall transfer level: Needs assistance Equipment used: 2 person hand held assist;Rolling walker (2 wheeled) Transfers: Sit to/from Stand Sit to Stand: +2 physical assistance;Max assist         General transfer comment: pt unable to achieve stand during today's session despite multiple attempts and multimodal cues. attempted x4 with reduced success each trial  Ambulation/Gait Ambulation/Gait assistance: (pt unable to achieve stand today, deferred)               Stairs             Wheelchair Mobility    Modified Rankin (Stroke Patients Only)       Balance Overall balance assessment: Needs assistance Sitting-balance support: No upper extremity supported;Feet supported Sitting balance-Leahy Scale: Fair Sitting balance - Comments: initially requires minA, progressed to minG   Standing balance support: Bilateral upper extremity supported;During functional activity Standing balance-Leahy Scale: Poor Standing balance comment: pt unable to achieve full stand despite maxA of 2                            Cognition Arousal/Alertness: Awake/alert Behavior During Therapy: Flat affect Overall Cognitive Status: Difficult to assess                                 General Comments: pt answers to name and bout ~50% of questions, sig increased processing time (pt answering "what did you do for work?" about  2-5 min after he was asked) even with simple cues ("sit up" or "stand up"). The pt showed improvements infunctional use of common items (toothbrush, comb) but needed hand over hand assist to initiate and sig extra time.      Exercises      General Comments        Pertinent Vitals/Pain Pain Assessment: No/denies pain    Home Living                      Prior Function            PT Goals (current goals can now be  found in the care plan section) Acute Rehab PT Goals Patient Stated Goal: none stated, wife wants to get patient back home and be able to take him to appointments like before PT Goal Formulation: With patient/family Time For Goal Achievement: 12/23/19 Potential to Achieve Goals: Fair Progress towards PT goals: Progressing toward goals    Frequency    Min 2X/week      PT Plan Current plan remains appropriate    Co-evaluation PT/OT/SLP Co-Evaluation/Treatment: Yes Reason for Co-Treatment: Complexity of the patient's impairments (multi-system involvement);Necessary to address cognition/behavior during functional activity;To address functional/ADL transfers;For patient/therapist safety PT goals addressed during session: Mobility/safety with mobility;Balance        AM-PAC PT "6 Clicks" Mobility   Outcome Measure  Help needed turning from your back to your side while in a flat bed without using bedrails?: A Little Help needed moving from lying on your back to sitting on the side of a flat bed without using bedrails?: A Little Help needed moving to and from a bed to a chair (including a wheelchair)?: Total Help needed standing up from a chair using your arms (e.g., wheelchair or bedside chair)?: A Lot Help needed to walk in hospital room?: Total Help needed climbing 3-5 steps with a railing? : Total 6 Click Score: 11    End of Session Equipment Utilized During Treatment: Gait belt Activity Tolerance: Patient tolerated treatment well;Other (comment)(progression of mobility limited by poor command followig/engagement) Patient left: in bed;with call bell/phone within reach Nurse Communication: Mobility status(need for condom cath replacement) PT Visit Diagnosis: History of falling (Z91.81);Muscle weakness (generalized) (M62.81);Difficulty in walking, not elsewhere classified (R26.2)     Time: 1000-1028 PT Time Calculation (min) (ACUTE ONLY): 28 min  Charges:  $Therapeutic  Activity: 8-22 mins                     Karma Ganja, PT, DPT   Acute Rehabilitation Department Pager #: [phone]   Otho Bellows 12/11/2019, 12:21 PM

## 2019-12-11 NOTE — Progress Notes (Signed)
Occupational Therapy Treatment Patient Details Name: [name] MRN: [id number] DOB: 1936/03/07 Today's Date: 12/11/2019    History of present illness Pt is an 84 yo male presenting after 3-4 days of increasing somunulence, blank stares, incontinence, and uncooperative behaviors. Pt undergoing continued workup for Lewy Body Dementia. 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   OT comments  Patient continues to make progress towards goals in skilled OT session. Patient's session encompassed co-treat with PT to address functional deficits in ADLs, transfers, and cognition. Pt remains total A to complete all tasks, however was able to respond to prompting but the answers were inappropriate 85% of the time. Therapy will continue to follow in order to attempt progression for functional deficits; will follow acutely.     Follow Up Recommendations  SNF;Supervision/Assistance - 24 hour    Equipment Recommendations  Hospital bed;Other (comment)(hoyer lift)    Recommendations for Other Services      Precautions / Restrictions Precautions Precautions: Fall Precaution Comments: wife reports x3 months of hallunications and more in the evening time starting Restrictions Weight Bearing Restrictions: No       Mobility Bed Mobility Overal bed mobility: Needs Assistance Bed Mobility: Rolling;Supine to Sit;Sit to Supine Rolling: Mod assist   Supine to sit: Mod assist Sit to supine: +2 for physical assistance;Mod assist   General bed mobility comments: pt requires mod assist to facilitate bed mobility due to no initiation by pt to request. pt once EOB sitting min guard to minA. Pt sustained EOB sitting throughout session without LOB  Transfers Overall transfer level: Needs assistance Equipment used: 2 person hand held assist;Rolling walker (2 wheeled) Transfers: Sit to/from Stand Sit to Stand: +2 physical  assistance;Max assist         General transfer comment: pt unable to achieve stand during today's session despite multiple attempts and multimodal cues. attempted x4 with reduced success each trial    Balance Overall balance assessment: Needs assistance Sitting-balance support: No upper extremity supported;Feet supported Sitting balance-Leahy Scale: Fair Sitting balance - Comments: initially requires minA, progressed to minG   Standing balance support: Bilateral upper extremity supported;During functional activity Standing balance-Leahy Scale: Poor Standing balance comment: pt unable to achieve full stand despite maxA of 2                           ADL either performed or assessed with clinical judgement   ADL Overall ADL's : Needs assistance/impaired     Grooming: Maximal assistance;Wash/dry face;Oral care;Brushing hair;Total assistance Grooming Details (indicate cue type and reason): pt presented tooth brush, comb, and washcloth, pt able to identify comb, but required hand over hand A to initiate all tasks (was able to wipe face and comb hair 20%), upon third attempt, pt able to brush teeth but only make contact with bottom two teeth     Lower Body Bathing: Total assistance       Lower Body Dressing: Total assistance Lower Body Dressing Details (indicate cue type and reason): doff socks Toilet Transfer: +2 for physical assistance;Maximal assistance;Total assistance Toilet Transfer Details (indicate cue type and reason): unable to clear hips to stand, attempted x4 but required max to total A         Functional mobility during ADLs: +2 for physical assistance;Maximal assistance;Total assistance General ADL Comments: pt minimally responsive, was able to sit EOB with min gaurd, however remains total A  for all functional tasks     Vision Baseline Vision/History: Wears glasses Wears Glasses: At all times     Perception     Praxis      Cognition  Arousal/Alertness: Awake/alert Behavior During Therapy: Flat affect Overall Cognitive Status: Difficult to assess                                 General Comments: pt answers to name and bout ~50% of questions, sig increased processing time (pt answering "what did you do for work?" about 2-5 min after he was asked) even with simple cues ("sit up" or "stand up"). The pt showed improvements infunctional use of common items (toothbrush, comb) but needed hand over hand assist to initiate and sig extra time.        Exercises     Shoulder Instructions       General Comments      Pertinent Vitals/ Pain       Pain Assessment: Faces Faces Pain Scale: Hurts little more Pain Location: left leg Pain Descriptors / Indicators: Aching;Guarding;Heaviness;Discomfort;Grimacing  Home Living                                          Prior Functioning/Environment              Frequency  Min 2X/week        Progress Toward Goals  OT Goals(current goals can now be found in the care plan section)  Progress towards OT goals: Progressing toward goals  Acute Rehab OT Goals Patient Stated Goal: none stated, wife wants to get patient back home and be able to take him to appointments like before OT Goal Formulation: With family Time For Goal Achievement: 12/23/19 Potential to Achieve Goals: [place] Discharge plan remains appropriate    Co-evaluation    PT/OT/SLP Co-Evaluation/Treatment: Yes Reason for Co-Treatment: Complexity of the patient's impairments (multi-system involvement);Necessary to address cognition/behavior during functional activity;For patient/therapist safety;To address functional/ADL transfers PT goals addressed during session: Mobility/safety with mobility;Balance OT goals addressed during session: ADL's and self-care;Strengthening/ROM      AM-PAC OT "6 Clicks" Daily Activity     Outcome Measure   Help from another person eating  meals?: Total Help from another person taking care of personal grooming?: Total Help from another person toileting, which includes using toliet, bedpan, or urinal?: Total Help from another person bathing (including washing, rinsing, drying)?: Total Help from another person to put on and taking off regular upper body clothing?: Total Help from another person to put on and taking off regular lower body clothing?: Total 6 Click Score: 6    End of Session Equipment Utilized During Treatment: Gait belt;Rolling walker  OT Visit Diagnosis: Unsteadiness on feet (R26.81);Muscle weakness (generalized) (M62.81);Cognitive communication deficit (R41.841)   Activity Tolerance Patient tolerated treatment well   Patient Left in bed;with call bell/phone within reach;with bed alarm set   Nurse Communication Mobility status;Precautions        Time: 1610-9604 OT Time Calculation (min): 27 min  Charges: OT General Charges $OT Visit: 1 Visit OT Treatments $Self Care/Home Management : 8-22 mins  Corinne Ports E. Agam Tuohy, COTA/L Acute Rehabilitation Services [phone] [place] 12/11/2019, 1:09 PM

## 2019-12-11 NOTE — Consult Note (Signed)
Consultation Note Date: 12/11/2019   Patient Name: [name]  DOB: Mar 11, 1936  MRN: [id number]  Age / Sex: 84 y.o., male  PCP: [name], MD Referring Physician: Mercy Riding, MD  Reason for Consultation: Establishing goals of care and Psychosocial/spiritual support  HPI/Patient Profile: 84 y.o. male   admitted on 12/08/2019 with past medical history significant of CVA; HTN; HLD; DM; CAD; prostate CA; and CKD presented with AMS.   Patient was hospitalized in October for CVA. He was seen by [name] neurologic on 3/15.  Over the past several weeks patient has declined physically, functionally and cognitively.  She reports that he is confused, paranoid and having auditory and visual hallucinations.  Patient was admitted for treatment and stabilization.  Patient was evaluated by neurology, current encephalopathy multifactorial; underlying dementia with behavioral disturbances.  No central nervous system infection suspected.  Psychiatry was consulted but unable to complete assessment secondary to patient's altered mental status  Today is day 3 of this hospitalization, he remains weak and lethargic with poor p.o. intake.  Family face treatment option decisions, advanced directive decisions and anticipatory care needs.   Clinical Assessment and Goals of Care:   This NP Wadie Lessen reviewed medical records, received report from team, assessed the patient and then meet at the patient's bedside along with his wife and daughter to discuss diagnosis, prognosis, GOC, EOL wishes disposition and options.  Concept of Hospice and Palliative Care were discussed  Created space and opportunity for family to explore their thoughts and feelings regarding patient's current medical situation.  We discussed concept specific to human mortality and the limitations of medical interventions to prolong quality of life  when the body begins to fail to thrive.  Patient's wife tearfully verbalizes her understanding that her husband "is not going to get any better than this".  She shares how hard it has been caring for him at home over the last many months with his multiple comorbidities and within the context of COVID-19.   Her main focus for her husband at this time is "that he does not suffer"  Daughter/Wendy supports her mother's decisions.  A detailed discussion was had today regarding advanced directives.  Concepts specific to code status, artifical feeding and hydration, continued IV antibiotics and rehospitalization was had.  The difference between a aggressive medical intervention path  and a palliative comfort care path for this patient at this time was had.  Values and goals of care important to patient and family were attempted to be elicited.  MOST form completed to reflect comfort, a Hard Choices booklet was left for review  Natural trajectory and expectations at EOL were discussed.  Questions and concerns addressed.   Family encouraged to call with questions or concerns.    PMT will continue to support holistically.     Patient has documented HPOA and Ad      SUMMARY OF RECOMMENDATIONS    Code Status/Advance Care Planning:  DNR   Palliative Prophylaxis:   Aspiration, Bowel Regimen, Delirium Protocol, Frequent  Pain Assessment and Oral Care  Additional Recommendations (Limitations, Scope, Preferences):  Avoid Hospitalization, No Artificial Feeding, No IV Fluids and No Lab Draws  Psycho-social/Spiritual:   Desire for further Chaplaincy support:yes  Additional Recommendations: Education on Hospice  Prognosis:   Shift to comfort approach today reevaluate in 24 to 36 hours for appropriate transition of care.  Outcome will be dependent on patient's ability to take oral intake.  Patient is high risk for rapid decompensation  Discharge Planning: To Be Determined      Primary  Diagnoses: Present on Admission: . AMS (altered mental status) . Bilateral carotid artery stenosis . CKD (chronic kidney disease), stage III . Controlled type 2 diabetes mellitus with hyperglycemia, without long-term current use of insulin ([name]) . Essential hypertension   I have reviewed the medical record, interviewed the patient and family, and examined the patient. The following aspects are pertinent.  Past Medical History:  Diagnosis Date  . Anxiety   . Arthritis   . Asthma   . Cancer [facility])    prostate  . Chronic kidney disease   . Coronary artery disease   . Diabetes mellitus without complication ([name])   . Gout   . High cholesterol   . HOH (hard of hearing) bilateral  . Hypertension   . PONV (postoperative nausea and vomiting)   . Right thalamic infarction ([name]) 06/17/2019  . Shingles   . Stroke [facility])    Social History   Socioeconomic History  . Marital status: Married    Spouse name: [name]  . Number of children: 1  . Years of education: 92  . Highest education level: Not on file  Occupational History  . Not on file  Tobacco Use  . Smoking status: Never Smoker  . Smokeless tobacco: Never Used  Substance and Sexual Activity  . Alcohol use: No  . Drug use: No  . Sexual activity: Yes  Other Topics Concern  . Not on file  Social History Narrative   Lives with wife   No caffeine   Social Determinants of Health   Financial Resource Strain:   . Difficulty of Paying Living Expenses:   Food Insecurity:   . Worried About Charity fundraiser in the Last Year:   . Arboriculturist in the Last Year:   Transportation Needs:   . Film/video editor (Medical):   [name] Lack of Transportation (Non-Medical):   Physical Activity:   . Days of Exercise per Week:   . Minutes of Exercise per Session:   Stress:   . Feeling of Stress :   Social Connections:   . Frequency of Communication with Friends and Family:   . Frequency of Social Gatherings with Friends and Family:    . Attends Religious Services:   . Active Member of Clubs or Organizations:   . Attends Archivist Meetings:   [name] Marital Status:    Family History  Problem Relation Age of Onset  . Heart disease Mother   . Heart attack Brother   . Heart attack Paternal Grandfather   . Anesthesia problems Neg Hx   . Hypotension Neg Hx   . Malignant hyperthermia Neg Hx   . Pseudochol deficiency Neg Hx    Scheduled Meds: . allopurinol  300 mg Oral QHS  . amLODipine  5 mg Oral q morning - 10a  . atenolol  50 mg Oral Daily  . atorvastatin  20 mg Oral QHS  . clopidogrel  75 mg Oral Daily  .  enoxaparin (LOVENOX) injection  30 mg Subcutaneous Q24H  . famotidine  20 mg Oral Daily  . feeding supplement (ENSURE ENLIVE)  237 mL Oral QID  . insulin aspart  0-9 Units Subcutaneous TID WC  . QUEtiapine  50 mg Oral QHS   Continuous Infusions: . sodium chloride 50 mL/hr at 12/10/19 1535   PRN Meds:.acetaminophen **OR** acetaminophen (TYLENOL) oral liquid 160 mg/5 mL **OR** acetaminophen, albuterol, labetalol, senna-docusate Medications Prior to Admission:  Prior to Admission medications   Medication Sig Start Date End Date Taking? Authorizing Provider  acetaminophen (TYLENOL) 325 MG tablet Take 2 tablets (650 mg total) by mouth every 4 (four) hours as needed for mild pain (or temp > 37.5 C (99.5 F)). 06/27/19  Yes Angiulli, Lavon Paganini, PA-C  albuterol (VENTOLIN HFA) 108 (90 Base) MCG/ACT inhaler Inhale 1-2 puffs into the lungs every 6 (six) hours as needed for wheezing or shortness of breath. 06/27/19  Yes Angiulli, Lavon Paganini, PA-C  allopurinol (ZYLOPRIM) 300 MG tablet Take 1 tablet (300 mg total) by mouth at bedtime. 06/27/19  Yes Angiulli, Lavon Paganini, PA-C  ALPRAZolam Duanne Moron) 0.5 MG tablet Take 0.5 mg by mouth 3 (three) times daily as needed for anxiety. 10/05/19  Yes [provider]  amLODipine (NORVASC) 5 MG tablet Take 1 tablet (5 mg total) by mouth every morning. 06/27/19  Yes Angiulli,  Lavon Paganini, PA-C  atenolol (TENORMIN) 100 MG tablet Take 50 mg by mouth daily.  07/31/19  Yes [provider]  atorvastatin (LIPITOR) 40 MG tablet Take 0.5 tablets (20 mg total) by mouth at bedtime. 06/27/19  Yes Angiulli, Lavon Paganini, PA-C  cholecalciferol (VITAMIN D3) 25 MCG (1000 UT) tablet Take 1 tablet (1,000 Units total) by mouth every evening. 06/27/19  Yes Angiulli, Lavon Paganini, PA-C  clopidogrel (PLAVIX) 75 MG tablet Take 1 tablet (75 mg total) by mouth daily. 06/27/19  Yes Angiulli, Lavon Paganini, PA-C  diclofenac Sodium (VOLTAREN) 1 % GEL Apply 2 g topically daily as needed (pain).   Yes [provider]  famotidine (PEPCID) 20 MG tablet Take 1 tablet (20 mg total) by mouth daily. 06/27/19  Yes Angiulli, Lavon Paganini, PA-C  furosemide (LASIX) 20 MG tablet Take 20 mg by mouth every morning. *May take as needed for fluid retention 04/23/19  Yes [provider]  glipiZIDE (GLUCOTROL) 5 MG tablet Take 1 tablet (5 mg total) by mouth every morning. 06/27/19  Yes Angiulli, Lavon Paganini, PA-C  loratadine (CLARITIN) 10 MG tablet Take 10 mg by mouth daily.   Yes [provider]  Lutein 20 MG CAPS Take 1 capsule by mouth every evening.    Yes [provider]  theophylline (THEODUR) 300 MG 12 hr tablet Take 300 mg by mouth 2 (two) times daily.   Yes [provider]  vitamin B-12 (CYANOCOBALAMIN) 250 MCG tablet Take 250 mcg by mouth every evening.   Yes [provider]  vitamin C (ASCORBIC ACID) 500 MG tablet Take 500 mg by mouth every evening.   Yes [provider]  VITAMIN E COMPLEX PO Take 1 capsule by mouth every evening.   Yes [provider]  blood glucose meter kit and supplies KIT Dispense based on patient and insurance preference. Use up to four times daily as directed. (FOR ICD-9 250.00, 250.01). 06/29/19   Jamse Arn, MD   Allergies  Allergen Reactions  . Nsaids Nausea And Vomiting    Issues with gallbladder, tolerate to  voltaren gel   Review of Systems  Unable to perform ROS: Mental status change    Physical Exam Constitutional:      Appearance: He is normal weight. He is ill-appearing.  Cardiovascular:     Rate and Rhythm: Normal rate and regular rhythm.  Musculoskeletal:     Comments: -Generalized weakness and muscle atrophy  Skin:    General: Skin is warm and dry.  Neurological:     Mental Status: He is lethargic and disoriented.     Vital Signs: BP (!) 183/73 (BP Location: Right Arm)   Pulse 75   Temp 98.1 F (36.7 C) (Oral)   Resp 16   Wt 79.5 kg   SpO2 99%   BMI 25.15 kg/m  Pain Scale: 0-10       SpO2: SpO2: 99 % O2 Device:SpO2: 99 % O2 Flow Rate: .   IO: Intake/output summary:   Intake/Output Summary (Last 24 hours) at 12/11/2019 6067 Last data filed at 12/11/2019 7034 Gross per 24 hour  Intake 250 ml  Output 900 ml  Net -650 ml    LBM: Last BM Date: (No noted at this time.) Baseline Weight: Weight: 79.5 kg Most recent weight: Weight: 79.5 kg     Palliative Assessment/Data:  30 % at best   Discussed with Dr Cyndia Skeeters and Liz/LCSW  Time In: 0930 Time Out: 1100 Time Total: 90 minutes Greater than 50%  of this time was spent counseling and coordinating care related to the above assessment and plan.  Signed by: Wadie Lessen, NP   Please contact Palliative Medicine Team phone at [phone] for questions and concerns.  For individual provider: See Shea Evans

## 2019-12-11 NOTE — Progress Notes (Signed)
PROGRESS NOTE  [name] John AJO:[id number] DOB: 1936-01-21   PCP: [name], MD  Patient is from: Home - lives with wife; NOK: Wife, [phone]; Daughter, Abigail Butts [phone]  DOA: 12/08/2019 LOS: 3  Brief Narrative / Interim history: 84 year old male with history of CVA with left hemiplegia, CAD/CABG in 1997, prostate cancer, CKD, DM-2, HTN and HLD presenting with altered mental status including confusion, poor p.o. intake, difficulty standing or performing ADLs.   MRI brain without acute finding.  EEG with moderate diffuse encephalopathy of nonspecific etiology but no seizure or epileptiform discharge.  Neurology recommended psych consult.  However, patient was unable to participate in psych evaluation.  Palliative care consulted.  After meeting with patient and family, comfort care approach initiated on 3/31.  Subjective: Seen and examined earlier this morning.  No major events overnight or this morning.  Patient is awake but not oriented.  He does not follows command.  He does not appear to be in distress either.  Objective: Vitals:   12/11/19 0403 12/11/19 0801 12/11/19 1116 12/11/19 1211  BP: (!) 160/51 (!) 183/73 (!) 175/59   Pulse: 69 75 83 83  Resp: 17 16 18    Temp: 98.6 F (37 C) 98.1 F (36.7 C) 98.5 F (36.9 C)   TempSrc: Oral Oral Oral   SpO2: 96% 99% 98%   Weight:        Intake/Output Summary (Last 24 hours) at 12/11/2019 1418 Last data filed at 12/11/2019 1231 Gross per 24 hour  Intake 880 ml  Output 900 ml  Net -20 ml   Filed Weights   12/09/19 1826  Weight: 79.5 kg    Examination:  GENERAL: No acute distress.  Appears well.  HEENT: MMM.  Vision and hearing grossly intact.  NECK: Supple.  No apparent JVD.  RESP: 96% on RA.  No IWOB.  Fair air movement bilaterally. CVS:  RRR. Heart sounds normal.  ABD/GI/GU: Bowel sounds present. Soft. Non tender.  MSK/EXT:  Moves extremities. No apparent deformity. No edema.  SKIN: no apparent skin  lesion or wound NEURO: Awake, alert but disoriented.  Does not follows command.  No facial asymmetry.  LUE deformity.  RUE tremor. PSYCH: Calm.  No distress or agitation.  Procedures:  None  Assessment & Plan: Acute metabolic encephalopathy with paranoia and hallucination: seems to have waxing and waning MS since 05/2019.  Some report of paranoia and A/V hallucination with possible progressive debility.  MRI brain without acute finding.  EEG without epileptiform discharge.  Some concern about Lewy body dementia.  Neurology recommended psych evaluation.  However, patient was unable to participate. -Comfort measures initiated by palliative care  History of CVA with residual left hemiparesis -Continue Plavix  Essential hypertension: SBP slightly elevated. -Continue atenolol and amlodipine.  Uncontrolled DM-2 with hyperglycemia: Recent Labs    12/11/19 0639 12/11/19 0759 12/11/19 1200  GLUCAP 157* 138* 285*  -Now comfort measures initiated.  History of CAD/CABG in 1997: Stable  Bilateral carotid artery stenosis: 60 to 79% stenosis in right and left ICA.  Seen by Dr. Donnetta Hutching in 08/2019 with plan to follow-up in 6 months.  CKD-4: Relatively stable.  History of prostate cancer  Debility  DNR/DNI       Nutrition Problem: Inadequate oral intake Etiology: lethargy/confusion  Signs/Symptoms: per patient/family report  Interventions: Ensure Enlive (each supplement provides 350kcal and 20 grams of protein)   DVT prophylaxis: Subcu Lovenox Code Status: DNR/DNI Family Communication: Patient and/or RN. Available if any question.  Discharge barrier: Encephalopathy  and determination of appropriate disposition based on clinical progress, SNF or Hospice Patient is from: Home Final disposition: To be determined based on clinical progress.  Consultants: Neurology, psychiatry, palliative care   Microbiology summarized: 3/28-COVID-19 negative. 3/31-COVID-19 negative.  Sch Meds:   Scheduled Meds: . allopurinol  300 mg Oral QHS  . amLODipine  5 mg Oral q morning - 10a  . atenolol  50 mg Oral Daily  . clopidogrel  75 mg Oral Daily  . enoxaparin (LOVENOX) injection  30 mg Subcutaneous Q24H  . famotidine  20 mg Oral Daily  . feeding supplement (ENSURE ENLIVE)  237 mL Oral QID  . insulin aspart  0-9 Units Subcutaneous TID WC  . QUEtiapine  50 mg Oral QHS   Continuous Infusions: PRN Meds:.acetaminophen **OR** acetaminophen (TYLENOL) oral liquid 160 mg/5 mL **OR** acetaminophen, albuterol, labetalol, senna-docusate  Antimicrobials: Anti-infectives (From admission, onward)   None       I have personally reviewed the following labs and images: CBC: Recent Labs  Lab 12/08/19 1113  WBC 6.8  NEUTROABS 4.9  HGB 12.8*  HCT 40.1  MCV 89.7  PLT 259   BMP &GFR Recent Labs  Lab 12/08/19 1113  NA 144  K 3.9  CL 110  CO2 24  GLUCOSE 130*  BUN 31*  CREATININE 2.13*  CALCIUM 10.1   Estimated Creatinine Clearance: 28.1 mL/min (A) (by C-G formula based on SCr of 2.13 mg/dL (H)). Liver & Pancreas: Recent Labs  Lab 12/08/19 1113  AST 16  ALT 12  ALKPHOS 70  BILITOT 1.0  PROT 8.0  ALBUMIN 3.9   No results for input(s): LIPASE, AMYLASE in the last 168 hours. No results for input(s): AMMONIA in the last 168 hours. Diabetic: No results for input(s): HGBA1C in the last 72 hours. Recent Labs  Lab 12/10/19 1618 12/10/19 2100 12/11/19 0639 12/11/19 0759 12/11/19 1200  GLUCAP 200* 179* 157* 138* 285*   Cardiac Enzymes: No results for input(s): CKTOTAL, CKMB, CKMBINDEX, TROPONINI in the last 168 hours. No results for input(s): PROBNP in the last 8760 hours. Coagulation Profile: Recent Labs  Lab 12/08/19 1113  INR 1.1   Thyroid Function Tests: Recent Labs    12/09/19 0508  TSH 2.281   Lipid Profile: Recent Labs    12/09/19 0508  CHOL 174  HDL 41  LDLCALC 104*  TRIG 144  CHOLHDL 4.2   Anemia Panel: No results for input(s):  VITAMINB12, FOLATE, FERRITIN, TIBC, IRON, RETICCTPCT in the last 72 hours. Urine analysis:    Component Value Date/Time   COLORURINE YELLOW 12/08/2019 1417   APPEARANCEUR CLEAR 12/08/2019 1417   APPEARANCEUR Clear 11/25/2019 1158   LABSPEC 1.016 12/08/2019 1417   PHURINE 6.0 12/08/2019 1417   GLUCOSEU NEGATIVE 12/08/2019 1417   HGBUR NEGATIVE 12/08/2019 1417   BILIRUBINUR NEGATIVE 12/08/2019 1417   BILIRUBINUR Negative 11/25/2019 1158   KETONESUR NEGATIVE 12/08/2019 1417   PROTEINUR 100 (A) 12/08/2019 1417   NITRITE NEGATIVE 12/08/2019 1417   LEUKOCYTESUR NEGATIVE 12/08/2019 1417   Sepsis Labs: Invalid input(s): PROCALCITONIN, [place]  Microbiology: Recent Results (from the past 240 hour(s))  SARS CORONAVIRUS 2 (TAT 6-24 HRS) Nasopharyngeal Nasopharyngeal Swab     Status: None   Collection Time: 12/08/19  1:44 PM   Specimen: Nasopharyngeal Swab  Result Value Ref Range Status   SARS Coronavirus 2 NEGATIVE NEGATIVE Final    Comment: (NOTE) SARS-CoV-2 target nucleic acids are NOT DETECTED. The SARS-CoV-2 RNA is generally detectable in upper and lower respiratory specimens during  the acute phase of infection. Negative results do not preclude SARS-CoV-2 infection, do not rule out co-infections with other pathogens, and should not be used as the sole basis for treatment or other patient management decisions. Negative results must be combined with clinical observations, patient history, and epidemiological information. The expected result is Negative. Fact Sheet for Patients: SugarRoll.be Fact Sheet for Healthcare Providers: https://www.woods-mathews.com/ This test is not yet approved or cleared by the [place] FDA and  has been authorized for detection and/or diagnosis of SARS-CoV-2 by FDA under an Emergency Use Authorization (EUA). This EUA will remain  in effect (meaning this test can be used) for the duration of the COVID-19  declaration under Section 56 4(b)(1) of the Act, 21 U.S.C. section 360bbb-3(b)(1), unless the authorization is terminated or revoked sooner. Performed at [facility], [place] [address], [place], [place] [address]   SARS CORONAVIRUS 2 (TAT 6-24 HRS) Nasopharyngeal Nasopharyngeal Swab     Status: None   Collection Time: 12/11/19  2:50 AM   Specimen: Nasopharyngeal Swab  Result Value Ref Range Status   SARS Coronavirus 2 NEGATIVE NEGATIVE Final    Comment: (NOTE) SARS-CoV-2 target nucleic acids are NOT DETECTED. The SARS-CoV-2 RNA is generally detectable in upper and lower respiratory specimens during the acute phase of infection. Negative results do not preclude SARS-CoV-2 infection, do not rule out co-infections with other pathogens, and should not be used as the sole basis for treatment or other patient management decisions. Negative results must be combined with clinical observations, patient history, and epidemiological information. The expected result is Negative. Fact Sheet for Patients: SugarRoll.be Fact Sheet for Healthcare Providers: https://www.woods-mathews.com/ This test is not yet approved or cleared by the [place] FDA and  has been authorized for detection and/or diagnosis of SARS-CoV-2 by FDA under an Emergency Use Authorization (EUA). This EUA will remain  in effect (meaning this test can be used) for the duration of the COVID-19 declaration under Section 56 4(b)(1) of the Act, 21 U.S.C. section 360bbb-3(b)(1), unless the authorization is terminated or revoked sooner. Performed at [facility], [place] [address], [place], [place] [address]     Radiology Studies: No results found.    Lakeith Careaga T. [place]  If 7PM-7AM, please contact night-coverage [email or web address] Password [email or web address] 12/11/2019, 2:18 PM

## 2019-12-12 ENCOUNTER — Encounter (HOSPITAL_COMMUNITY): Payer: Self-pay | Admitting: Internal Medicine

## 2019-12-12 DIAGNOSIS — F03918 Unspecified dementia, unspecified severity, with other behavioral disturbance: Secondary | ICD-10-CM

## 2019-12-12 DIAGNOSIS — F0391 Unspecified dementia with behavioral disturbance: Secondary | ICD-10-CM

## 2019-12-12 LAB — GLUCOSE, CAPILLARY
Glucose-Capillary: 152 mg/dL — ABNORMAL HIGH (ref 70–99)
Glucose-Capillary: 188 mg/dL — ABNORMAL HIGH (ref 70–99)
Glucose-Capillary: 245 mg/dL — ABNORMAL HIGH (ref 70–99)
Glucose-Capillary: 165 mg/dL — ABNORMAL HIGH (ref 70–99)

## 2019-12-12 MED ORDER — LORAZEPAM 0.5 MG PO TABS: 0.5000 mg | ORAL_TABLET | ORAL | 0 refills | Status: AC | PRN

## 2019-12-12 MED ORDER — HYDRALAZINE HCL 50 MG PO TABS
50.0000 mg | ORAL_TABLET | Freq: Three times a day (TID) | ORAL | Status: DC
  Administered 2019-12-12 – 2019-12-13 (×2): 50 mg via ORAL
  Filled 2019-12-12 (×2): qty 1

## 2019-12-12 MED ORDER — MORPHINE SULFATE (CONCENTRATE) 10 MG /0.5 ML PO SOLN: 10.0000 mg | ORAL | 0 refills | Status: AC | PRN

## 2019-12-12 MED ORDER — AMLODIPINE BESYLATE 10 MG PO TABS
10.0000 mg | ORAL_TABLET | Freq: Every morning | ORAL | Status: DC
  Administered 2019-12-12 – 2019-12-13 (×2): 10 mg via ORAL
  Filled 2019-12-12 (×2): qty 1

## 2019-12-12 NOTE — Progress Notes (Signed)
PROGRESS NOTE  [name] UMP:[id number] DOB: [birth year]   PCP: [name], MD  Patient is from: Home - lives with wife; NOK: Wife, [phone]; Daughter, Abigail Butts [phone]  DOA: 12/08/2019 LOS: 4  Brief Narrative / Interim history: 84 year old male with history of CVA with left hemiplegia, CAD/CABG in 1997, prostate cancer, CKD, DM-2, HTN and HLD presenting with altered mental status including confusion, poor p.o. intake, difficulty standing or performing ADLs.   MRI brain without acute finding.  EEG with moderate diffuse encephalopathy of nonspecific etiology but no seizure or epileptiform discharge.  Neurology recommended psych consult.  However, patient was unable to participate in psych evaluation.  Palliative care consulted.  After meeting with patient and family, the recommendation is limited scope of care with emphasis on comfort.  Subjective: Seen and examined earlier this morning.  No major events overnight or this morning.  Patient is awake but not oriented.  Does not follows command.  Does not appear to be in distress.  Objective: Vitals:   12/11/19 2321 12/12/19 0338 12/12/19 1100 12/12/19 1155  BP: (!) 151/90 (!) 181/65 (!) 212/82 (!) 188/58  Pulse: 73 72 79 79  Resp: 17 17 16 17   Temp: 100 F (37.8 C) 99.5 F (37.5 C)    TempSrc: Oral Oral    SpO2: 95% 93% 94%   Weight:        Intake/Output Summary (Last 24 hours) at 12/12/2019 1345 Last data filed at 12/12/2019 1310 Gross per 24 hour  Intake 700 ml  Output 225 ml  Net 475 ml   Filed Weights   12/09/19 1826  Weight: 79.5 kg    Examination:  GENERAL: No apparent distress.  Nontoxic. HEENT: MMM.  Vision and hearing grossly intact.  NECK: Supple.  No apparent JVD.  RESP: On RA.  No IWOB.  Fair aeration bilaterally. CVS:  RRR. Heart sounds normal.  ABD/GI/GU: Bowel sounds present. Soft. Non tender.  MSK/EXT:  Moves extremities.  LUE deformity from prior stroke. SKIN: no apparent skin lesion  or wound NEURO: Awake but not oriented.  Does not follows command.  No facial asymmetry.  LUE deformity. PSYCH: Calm.  No distress or agitation.  Procedures:  None  Assessment & Plan: Acute metabolic encephalopathy with paranoia and hallucination: seems to have waxing and waning MS since 05/2019.  Some report of paranoia and A/V hallucination with possible progressive debility.  MRI brain without acute finding.  EEG without epileptiform discharge.  Some concern about Lewy body dementia.  Neurology recommended psych evaluation.  However, patient was unable to participate. -Limited scope of care with emphasis on comfort started on 3/31.  History of CVA with residual left hemiparesis -Continue Plavix  Essential hypertension: SBP elevated to 180s. -Continue home atenolol at 50 mg daily -Increase amlodipine to 10 mg daily.  Uncontrolled DM-2 with hyperglycemia: Recent Labs    12/11/19 2114 12/12/19 0604 12/12/19 1241  GLUCAP 160* 152* 188*  -Now comfort measures initiated.  History of CAD/CABG in 1997: Stable  Bilateral carotid artery stenosis: 60 to 79% stenosis in right and left ICA.  Seen by Dr. Donnetta Hutching in 08/2019 with plan to follow-up in 6 months. -Now limited to scope of care  CKD-4: Relatively stable.  History of prostate cancer  Debility  DNR/DNI      Nutrition Problem: Inadequate oral intake Etiology: lethargy/confusion  Signs/Symptoms: per patient/family report  Interventions: Ensure Enlive (each supplement provides 350kcal and 20 grams of protein)   DVT prophylaxis: Subcu Lovenox Code Status: DNR/DNI  Family Communication: Patient and/or RN. Available if any question.  Discharge barrier: Appropriate disposition based on clinical progress, likely SNF with hospice.  I doubt he qualifies for residential hospice at this time. Patient is from: Home Final disposition: To be determined after PMT re-evaluation  Consultants: Neurology, psychiatry, palliative  care   Microbiology summarized: 3/28-COVID-19 negative. 3/31-COVID-19 negative.  Sch Meds:  Scheduled Meds: . allopurinol  300 mg Oral QHS  . amLODipine  10 mg Oral q morning - 10a  . atenolol  50 mg Oral Daily  . chlorhexidine  15 mL Mouth/Throat BID  . clopidogrel  75 mg Oral Daily  . enoxaparin (LOVENOX) injection  30 mg Subcutaneous Q24H  . famotidine  20 mg Oral Daily  . feeding supplement (ENSURE ENLIVE)  237 mL Oral QID  . insulin aspart  0-9 Units Subcutaneous TID WC  . nystatin cream   Topical BID  . QUEtiapine  50 mg Oral QHS   Continuous Infusions: PRN Meds:.acetaminophen **OR** acetaminophen (TYLENOL) oral liquid 160 mg/5 mL **OR** acetaminophen, albuterol, labetalol, senna-docusate  Antimicrobials: Anti-infectives (From admission, onward)   None       I have personally reviewed the following labs and images: CBC: Recent Labs  Lab 12/08/19 1113  WBC 6.8  NEUTROABS 4.9  HGB 12.8*  HCT 40.1  MCV 89.7  PLT 259   BMP &GFR Recent Labs  Lab 12/08/19 1113  NA 144  K 3.9  CL 110  CO2 24  GLUCOSE 130*  BUN 31*  CREATININE 2.13*  CALCIUM 10.1   Estimated Creatinine Clearance: 28.1 mL/min (A) (by C-G formula based on SCr of 2.13 mg/dL (H)). Liver & Pancreas: Recent Labs  Lab 12/08/19 1113  AST 16  ALT 12  ALKPHOS 70  BILITOT 1.0  PROT 8.0  ALBUMIN 3.9   No results for input(s): LIPASE, AMYLASE in the last 168 hours. No results for input(s): AMMONIA in the last 168 hours. Diabetic: No results for input(s): HGBA1C in the last 72 hours. Recent Labs  Lab 12/11/19 1200 12/11/19 1621 12/11/19 2114 12/12/19 0604 12/12/19 1241  GLUCAP 285* 211* 160* 152* 188*   Cardiac Enzymes: No results for input(s): CKTOTAL, CKMB, CKMBINDEX, TROPONINI in the last 168 hours. No results for input(s): PROBNP in the last 8760 hours. Coagulation Profile: Recent Labs  Lab 12/08/19 1113  INR 1.1   Thyroid Function Tests: No results for input(s): TSH,  T4TOTAL, FREET4, T3FREE, THYROIDAB in the last 72 hours. Lipid Profile: No results for input(s): CHOL, HDL, LDLCALC, TRIG, CHOLHDL, LDLDIRECT in the last 72 hours. Anemia Panel: No results for input(s): VITAMINB12, FOLATE, FERRITIN, TIBC, IRON, RETICCTPCT in the last 72 hours. Urine analysis:    Component Value Date/Time   COLORURINE YELLOW 12/08/2019 1417   APPEARANCEUR CLEAR 12/08/2019 1417   APPEARANCEUR Clear 11/25/2019 1158   LABSPEC 1.016 12/08/2019 1417   PHURINE 6.0 12/08/2019 1417   GLUCOSEU NEGATIVE 12/08/2019 1417   HGBUR NEGATIVE 12/08/2019 1417   BILIRUBINUR NEGATIVE 12/08/2019 1417   BILIRUBINUR Negative 11/25/2019 1158   KETONESUR NEGATIVE 12/08/2019 1417   PROTEINUR 100 (A) 12/08/2019 1417   NITRITE NEGATIVE 12/08/2019 1417   LEUKOCYTESUR NEGATIVE 12/08/2019 1417   Sepsis Labs: Invalid input(s): PROCALCITONIN, [place]  Microbiology: Recent Results (from the past 240 hour(s))  SARS CORONAVIRUS 2 (TAT 6-24 HRS) Nasopharyngeal Nasopharyngeal Swab     Status: None   Collection Time: 12/08/19  1:44 PM   Specimen: Nasopharyngeal Swab  Result Value Ref Range Status   SARS Coronavirus  2 NEGATIVE NEGATIVE Final    Comment: (NOTE) SARS-CoV-2 target nucleic acids are NOT DETECTED. The SARS-CoV-2 RNA is generally detectable in upper and lower respiratory specimens during the acute phase of infection. Negative results do not preclude SARS-CoV-2 infection, do not rule out co-infections with other pathogens, and should not be used as the sole basis for treatment or other patient management decisions. Negative results must be combined with clinical observations, patient history, and epidemiological information. The expected result is Negative. Fact Sheet for Patients: SugarRoll.be Fact Sheet for Healthcare Providers: https://www.woods-mathews.com/ This test is not yet approved or cleared by the [place] FDA and  has been  authorized for detection and/or diagnosis of SARS-CoV-2 by FDA under an Emergency Use Authorization (EUA). This EUA will remain  in effect (meaning this test can be used) for the duration of the COVID-19 declaration under Section 56 4(b)(1) of the Act, 21 U.S.C. section 360bbb-3(b)(1), unless the authorization is terminated or revoked sooner. Performed at [facility], [place] [address] [place], [place] [address]   SARS CORONAVIRUS 2 (TAT 6-24 HRS) Nasopharyngeal Nasopharyngeal Swab     Status: None   Collection Time: 12/11/19  2:50 AM   Specimen: Nasopharyngeal Swab  Result Value Ref Range Status   SARS Coronavirus 2 NEGATIVE NEGATIVE Final    Comment: (NOTE) SARS-CoV-2 target nucleic acids are NOT DETECTED. The SARS-CoV-2 RNA is generally detectable in upper and lower respiratory specimens during the acute phase of infection. Negative results do not preclude SARS-CoV-2 infection, do not rule out co-infections with other pathogens, and should not be used as the sole basis for treatment or other patient management decisions. Negative results must be combined with clinical observations, patient history, and epidemiological information. The expected result is Negative. Fact Sheet for Patients: SugarRoll.be Fact Sheet for Healthcare Providers: https://www.woods-mathews.com/ This test is not yet approved or cleared by the [place] FDA and  has been authorized for detection and/or diagnosis of SARS-CoV-2 by FDA under an Emergency Use Authorization (EUA). This EUA will remain  in effect (meaning this test can be used) for the duration of the COVID-19 declaration under Section 56 4(b)(1) of the Act, 21 U.S.C. section 360bbb-3(b)(1), unless the authorization is terminated or revoked sooner. Performed at [facility], [place] [address] [place], [place] [address]     Radiology Studies: No results found.    Rocklyn Mayberry T. [place]  If 7PM-7AM, please contact night-coverage [email or web address] Password [email or web address] 12/12/2019, 1:45 PM

## 2019-12-12 NOTE — Progress Notes (Signed)
Patient ID: [name], male   DOB: 25-Dec-1935, 84 y.o.   MRN: [id number]  This NP visited patient at the bedside as a follow up to  yesterday's [place], and to meet with wife and daughter again regarding treatment plan and transition of care.  Patient is more alert today and is tolerating meals, he requires feeding.  Patient has multiple comorbidities, is total care, is frail and high risk for decompensation  Again we discussed today the natural trajectory and expectations of dementia and its uncertainties.  Family remain at peace and comfortable with their decision that the focus of care should ultimately be on comfort and dignity.    Emotional support offered  Today they make decision for patient to transition to a skilled nursing facility for short-term rehab.  Recommend palliative care services to follow at facility.  When eligible they are open to hospice services, education offered on hospice services.  The natural trajectory and expectations at end of life discussed.  Discussed with family the importance of continued conversation with each other  and their  medical providers regarding overall plan of care and treatment options,  ensuring decisions are within the context of the patients values and GOCs.  Questions and concerns addressed   Discussed with Dr Cyndia Skeeters and Liz/LCSW  Total time spent on the unit was 25 minutes  Greater than 50% of the time was spent in counseling and coordination of care  Wadie Lessen NP  Palliative Medicine Team Team Phone # [phone] Pager [phone]

## 2019-12-13 DIAGNOSIS — R627 Adult failure to thrive: Secondary | ICD-10-CM | POA: Diagnosis not present

## 2019-12-13 DIAGNOSIS — I10 Essential (primary) hypertension: Secondary | ICD-10-CM | POA: Diagnosis not present

## 2019-12-13 DIAGNOSIS — F0391 Unspecified dementia with behavioral disturbance: Secondary | ICD-10-CM | POA: Diagnosis not present

## 2019-12-13 DIAGNOSIS — H9193 Unspecified hearing loss, bilateral: Secondary | ICD-10-CM | POA: Diagnosis not present

## 2019-12-13 DIAGNOSIS — J441 Chronic obstructive pulmonary disease with (acute) exacerbation: Secondary | ICD-10-CM | POA: Diagnosis not present

## 2019-12-13 DIAGNOSIS — R479 Unspecified speech disturbances: Secondary | ICD-10-CM | POA: Diagnosis not present

## 2019-12-13 DIAGNOSIS — Z7401 Bed confinement status: Secondary | ICD-10-CM | POA: Diagnosis not present

## 2019-12-13 DIAGNOSIS — N1832 Chronic kidney disease, stage 3b: Secondary | ICD-10-CM | POA: Diagnosis not present

## 2019-12-13 DIAGNOSIS — R262 Difficulty in walking, not elsewhere classified: Secondary | ICD-10-CM | POA: Diagnosis not present

## 2019-12-13 DIAGNOSIS — I959 Hypotension, unspecified: Secondary | ICD-10-CM | POA: Diagnosis not present

## 2019-12-13 DIAGNOSIS — E119 Type 2 diabetes mellitus without complications: Secondary | ICD-10-CM | POA: Diagnosis not present

## 2019-12-13 DIAGNOSIS — I6523 Occlusion and stenosis of bilateral carotid arteries: Secondary | ICD-10-CM | POA: Diagnosis not present

## 2019-12-13 DIAGNOSIS — M1A00X0 Idiopathic chronic gout, unspecified site, without tophus (tophi): Secondary | ICD-10-CM | POA: Diagnosis not present

## 2019-12-13 DIAGNOSIS — I2510 Atherosclerotic heart disease of native coronary artery without angina pectoris: Secondary | ICD-10-CM | POA: Diagnosis not present

## 2019-12-13 DIAGNOSIS — M6281 Muscle weakness (generalized): Secondary | ICD-10-CM | POA: Diagnosis not present

## 2019-12-13 DIAGNOSIS — I6930 Unspecified sequelae of cerebral infarction: Secondary | ICD-10-CM | POA: Diagnosis not present

## 2019-12-13 DIAGNOSIS — N184 Chronic kidney disease, stage 4 (severe): Secondary | ICD-10-CM | POA: Diagnosis not present

## 2019-12-13 DIAGNOSIS — I25810 Atherosclerosis of coronary artery bypass graft(s) without angina pectoris: Secondary | ICD-10-CM | POA: Diagnosis not present

## 2019-12-13 DIAGNOSIS — I639 Cerebral infarction, unspecified: Secondary | ICD-10-CM | POA: Diagnosis not present

## 2019-12-13 DIAGNOSIS — E1165 Type 2 diabetes mellitus with hyperglycemia: Secondary | ICD-10-CM | POA: Diagnosis not present

## 2019-12-13 DIAGNOSIS — M2550 Pain in unspecified joint: Secondary | ICD-10-CM | POA: Diagnosis not present

## 2019-12-13 DIAGNOSIS — G8194 Hemiplegia, unspecified affecting left nondominant side: Secondary | ICD-10-CM | POA: Diagnosis not present

## 2019-12-13 DIAGNOSIS — I679 Cerebrovascular disease, unspecified: Secondary | ICD-10-CM | POA: Diagnosis not present

## 2019-12-13 LAB — GLUCOSE, CAPILLARY
Glucose-Capillary: 152 mg/dL — ABNORMAL HIGH (ref 70–99)
Glucose-Capillary: 164 mg/dL — ABNORMAL HIGH (ref 70–99)

## 2019-12-13 MED ORDER — ENSURE ENLIVE PO LIQD: 237.0000 mL | Freq: Four times a day (QID) | ORAL | 12 refills | Status: AC

## 2019-12-13 MED ORDER — QUETIAPINE FUMARATE 50 MG PO TABS: 50.0000 mg | ORAL_TABLET | Freq: Every day | ORAL | Status: AC

## 2019-12-13 MED ORDER — AMLODIPINE BESYLATE 10 MG PO TABS: 10.0000 mg | ORAL_TABLET | Freq: Every day | ORAL | Status: AC

## 2019-12-13 MED ORDER — MULTI-VITAMIN/MINERALS PO TABS: 1.0000 | ORAL_TABLET | Freq: Every day | ORAL | 2 refills | Status: AC

## 2019-12-13 NOTE — Progress Notes (Signed)
Physical Therapy Treatment Patient Details Name: [name] MRN: [id number] DOB: 1936/05/23 Today's Date: 12/13/2019    History of Present Illness Pt is an 84 yo male presenting after 3-4 days of increasing somunulence, blank stares, incontinence, and uncooperative behaviors. Pt undergoing continued workup for Lewy Body Dementia. 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    PT Comments    Pt supine in bed on arrival.  He was able to come to sitting then expressed need to have a BM.  He required assistance to rise into standing with max +2 utilizing the sara stedy.  Continue to recommend rehab in a post acute setting to improve strength and function before returning home.    Follow Up Recommendations  SNF;Supervision/Assistance - 24 hour     Equipment Recommendations  None recommended by PT(defer to post acute)    Recommendations for Other Services       Precautions / Restrictions Precautions Precautions: Fall Restrictions Weight Bearing Restrictions: No    Mobility  Bed Mobility Overal bed mobility: Needs Assistance Bed Mobility: Supine to Sit       Sit to supine: Max assist;+2 for safety/equipment   General bed mobility comments: Pt required assistance to move LEs to edge of bed and to elevate trunk into a seated position.  Pt continues to benefit from use of bed pad to scoot to edge of bed.  Once in sitting with B LEs supported he is able to hold his balance.  Transfers Overall transfer level: Needs assistance Equipment used: Ambulation equipment used(sara stedy used for transfers.) Transfers: Sit to/from Stand Sit to Stand: +2 physical assistance;Max assist         General transfer comment: Pt performed sit to stand with use of RUE and max +2 assistance to achieve standing.  bed height elevated for improved ease.  Performed additional transfer from commode and from stedy flaps.   Pt limited due to lack of hip extension.  Ambulation/Gait                 Stairs             Wheelchair Mobility    Modified Rankin (Stroke Patients Only)       Balance Overall balance assessment: Needs assistance Sitting-balance support: No upper extremity supported;Feet supported Sitting balance-Leahy Scale: Poor Sitting balance - Comments: initially requires minA, progressed to minG   Standing balance support: Bilateral upper extremity supported;During functional activity Standing balance-Leahy Scale: Zero Standing balance comment: external assistance to maintain standing.                            Cognition Arousal/Alertness: Awake/alert Behavior During Therapy: Flat affect Overall Cognitive Status: Difficult to assess                                 General Comments: He needs increased time to process with extra time he will eventually follow commands but required time and sometime repeated cueing.      Exercises      General Comments        Pertinent Vitals/Pain Pain Assessment: No/denies pain Faces Pain Scale: Hurts little more Pain Location: left leg Pain Descriptors / Indicators: Aching;Guarding;Heaviness;Discomfort;Grimacing Pain Intervention(s): Monitored during session;Repositioned    Home Living  Prior Function            PT Goals (current goals can now be found in the care plan section) Acute Rehab PT Goals Patient Stated Goal: To use the commode, expressed need to have BM Potential to Achieve Goals: Fair Progress towards PT goals: Progressing toward goals    Frequency    Min 2X/week      PT Plan Current plan remains appropriate    Co-evaluation              AM-PAC PT "6 Clicks" Mobility   Outcome Measure  Help needed turning from your back to your side while in a flat bed without using bedrails?: Total Help needed moving from lying on your back to  sitting on the side of a flat bed without using bedrails?: Total Help needed moving to and from a bed to a chair (including a wheelchair)?: Total Help needed standing up from a chair using your arms (e.g., wheelchair or bedside chair)?: Total Help needed to walk in hospital room?: Total Help needed climbing 3-5 steps with a railing? : Total 6 Click Score: 6    End of Session Equipment Utilized During Treatment: Gait belt Activity Tolerance: Patient tolerated treatment well;Other (comment) Patient left: with call bell/phone within reach;in chair;with chair alarm set(maxi move sling pad under patient for back to bed.) Nurse Communication: Mobility status PT Visit Diagnosis: History of falling (Z91.81);Muscle weakness (generalized) (M62.81);Difficulty in walking, not elsewhere classified (R26.2)     Time: 0930-1010 PT Time Calculation (min) (ACUTE ONLY): 40 min  Charges:  $Therapeutic Activity: 38-52 mins                     Erasmo Leventhal , PTA Acute Rehabilitation Services Pager [phone] Office [phone]     Djeneba Barsch Eli Hose 12/13/2019, 10:32 AM

## 2019-12-13 NOTE — Progress Notes (Signed)
  Speech Language Pathology Treatment: Dysphagia  Patient Details Name: [name] MRN: [id number] DOB: 1935-09-14 Today's Date: 12/13/2019 Time: 1364-3837 SLP Time Calculation (min) (ACUTE ONLY): 18 min  Assessment / Plan / Recommendation Clinical Impression  [name] is much improved since last seen by SLP services.  He is more communicative, spontaneous. Demonstrates improved ability to follow commands intermittently. Continues to require verbal cues to initiate oral motor tasks and to shift function - this difficulty is apparent during meals.  [name] engages in pattern for masticating purees, then has great difficulty recognizing requirements needed to masticate solids ("do you just want me to lay this on my tongue?").  Regular solids required multiple trials before he was able to initiate correct pattern of mastication - once this began, he was able to eat graham crackers and peanut butter without difficulty.  He required increasing verbal/visual cues when transitioning back to liquids with a straw.  There were no s/s of aspiration throughout. He was able to feed himself with RUE when bowls/cups were stabilized for him.  Recommending advancing diet to regular solids, thin liquids.  Please allow [name] to feed himself with assistance; he needs "warm up" time with each consistency so that he will experience success.  [name] for D/C today to SNF.  SLP f/u will be beneficial.   HPI HPI: 48 male admitted with worsening MS; presented after 3-4 days of increasing somnolence, blank stares, incontinence, and uncooperative behaviors. PMH includes right thalamic CVA 05/2019 with residual L-sided weakness and contractures, HTN, HLD, DM, CAD, prostate CA, and CKD.  [name]'s wife reports the last few months have been marked by behavioral disturbances, interrupted sleep cycle, hallucinations. MRI negative for acute stroke. Neuro dx multifactorial toxic metabolic encephalopathy, possible underlying dementia unmasked by stroke.  [name]  was admitted to CIR after his September stroke - at time of D/C from rehab, he scored 19/30 on the [facility] with deficits in attention, short-term recall, and executive functioning.       SLP Plan  All goals met       Recommendations  Diet recommendations: Regular;Thin liquid Liquids provided via: Cup;Straw Medication Administration: Whole meds with puree Supervision: Staff to assist with self feeding Compensations: Minimize environmental distractions                Oral Care Recommendations: Oral care BID SLP Visit Diagnosis: Dysphagia, oropharyngeal phase (R13.12) Plan: All goals met       GO                Juan Quam Laurice 12/13/2019, 9:22 AM  Estill Bamberg L. Tivis Ringer, [place] Office number [phone] Pager [phone]

## 2019-12-13 NOTE — Discharge Summary (Signed)
Physician Discharge Summary  [name] HER:[id number] DOB: 06-26-1936 DOA: 12/08/2019  PCP: [name], MD  Admit date: 12/08/2019 Discharge date: 12/13/2019  Admitted From: Home. Disposition: SNF  Recommendations for Outpatient Follow-up:  1. Follow ups as below. 2. Palliative/hospice follow-up at SNF. 3. Please obtain CBC/BMP/Mag at follow up 4. Please follow up on the following pending results: None  Discharge Condition: Stable CODE STATUS: DNR/DNI  Contact information for after-discharge care    [facility] .   Service: Skilled Nursing Contact information: [address] [place] [phone]               Hospital Course: 84 year old male with history of CVA with left hemiplegia, CAD/CABG in 1997, prostate cancer, CKD, DM-2, HTN and HLD presenting with altered mental status including confusion, poor p.o. intake, difficulty standing or performing ADLs.   MRI brain without acute finding.  EEG with moderate diffuse encephalopathy of nonspecific etiology but no seizure or epileptiform discharge.  Neurology recommended psych consult.  However, patient was unable to participate in psych evaluation.  Palliative care consulted.  After meeting with patient and family, the recommendation is limited scope of care with emphasis on comfort.  Recommend palliative/hospice follow-up at SNF.  See individual problem list below for more hospital course.  Discharge Diagnoses:  Acute metabolic encephalopathy with paranoia and hallucination: seems to have waxing and waning MS since 05/2019.  Some report of paranoia and A/V hallucination with possible progressive debility.  MRI brain without acute finding.  EEG without epileptiform discharge.  Some concern about Lewy body dementia.  Neurology recommended psych evaluation.  However, patient was unable to participate.  Palliative care consulted and recommended  limited scope of care with emphasis on comfort.  On the day of discharge, mental status improved.  He was awake and oriented to self.  Follows some commands. -Limited scope of care with emphasis on comfort started on 3/31. -Frequent orientation delirium precautions. -Seroquel 50 mg nightly  History of CVA with residual left hemiparesis -Continue home meds.  Essential hypertension: SBP elevated but improved. -Continue home atenolol at 50 mg daily -Increased amlodipine to 10 mg daily, and added hydralazine 50 mg 3 times daily.  Uncontrolled DM-2 with hyperglycemia: Recent Labs    12/12/19 2108 12/13/19 0620 12/13/19 0905  GLUCAP 165* 152* 164*  -Discharged on home glipizide  History of CAD/CABG in 1997: Stable -Continue home medications  Bilateral carotid artery stenosis: 60 to 79% stenosis in right and left ICA.  Seen by Dr. Donnetta Hutching in 08/2019 with plan to follow-up in 6 months. -Now limited to scope of care with plan to transition to comfort care if no improvement  CKD-4: Relatively stable.  History of prostate cancer  Debility -Trial of therapy at SNF  Goal of care/DNR/DNI-limited scope of care with a plan to transition to comfort care if no improvement after trial of therapy.  Dysphagia: SLP recommendation as below.  Continue speech therapy at SNF Diet recommendations: Regular;Thin liquid Liquids provided via: Cup;Straw Medication Administration: Whole meds with puree Supervision: Staff to assist with self feeding Compensations: Minimize environmental distractions  Nutrition Problem: Inadequate oral intake Etiology: lethargy/confusion  Signs/Symptoms: per patient/family report  Interventions: Ensure Enlive (each supplement provides 350kcal and 20 grams of protein)    Discharge Instructions  Discharge Instructions    Diet general   Complete by: As directed    Increase activity slowly   Complete by: As directed  Allergies as of 12/13/2019       Reactions   Nsaids Nausea And Vomiting   Issues with gallbladder, tolerate to voltaren gel      Medication List    STOP taking these medications   ALPRAZolam 0.5 MG tablet Commonly known as: XANAX   theophylline 300 MG 12 hr tablet Commonly known as: THEODUR   vitamin B-12 250 MCG tablet Commonly known as: CYANOCOBALAMIN   vitamin C 500 MG tablet Commonly known as: ASCORBIC ACID   VITAMIN E COMPLEX PO     TAKE these medications   acetaminophen 325 MG tablet Commonly known as: TYLENOL Take 2 tablets (650 mg total) by mouth every 4 (four) hours as needed for mild pain (or temp > 37.5 C (99.5 F)).   albuterol 108 (90 Base) MCG/ACT inhaler Commonly known as: VENTOLIN HFA Inhale 1-2 puffs into the lungs every 6 (six) hours as needed for wheezing or shortness of breath.   allopurinol 300 MG tablet Commonly known as: ZYLOPRIM Take 1 tablet (300 mg total) by mouth at bedtime.   amLODipine 5 MG tablet Commonly known as: NORVASC Take 1 tablet (5 mg total) by mouth every morning.   atenolol 100 MG tablet Commonly known as: TENORMIN Take 50 mg by mouth daily.   atorvastatin 40 MG tablet Commonly known as: LIPITOR Take 0.5 tablets (20 mg total) by mouth at bedtime.   blood glucose meter kit and supplies Kit Dispense based on patient and insurance preference. Use up to four times daily as directed. (FOR ICD-9 250.00, 250.01).   cholecalciferol 25 MCG (1000 UNIT) tablet Commonly known as: VITAMIN D3 Take 1 tablet (1,000 Units total) by mouth every evening.   clopidogrel 75 MG tablet Commonly known as: PLAVIX Take 1 tablet (75 mg total) by mouth daily.   famotidine 20 MG tablet Commonly known as: PEPCID Take 1 tablet (20 mg total) by mouth daily.   feeding supplement (ENSURE ENLIVE) Liqd Take 237 mLs by mouth 4 (four) times daily.   furosemide 20 MG tablet Commonly known as: LASIX Take 20 mg by mouth every morning. *May take as needed for fluid retention    glipiZIDE 5 MG tablet Commonly known as: GLUCOTROL Take 1 tablet (5 mg total) by mouth every morning.   loratadine 10 MG tablet Commonly known as: CLARITIN Take 10 mg by mouth daily.   LORazepam 0.5 MG tablet Commonly known as: Ativan Take 1 tablet (0.5 mg total) by mouth every 4 (four) hours as needed for up to 20 doses for anxiety.   Lutein 20 MG Caps Take 1 capsule by mouth every evening.   morphine CONCENTRATE 10 mg / 0.5 ml concentrated solution Take 0.5 mLs (10 mg total) by mouth every 3 (three) hours as needed for moderate pain, severe pain or shortness of breath.   QUEtiapine 50 MG tablet Commonly known as: SEROQUEL Take 1 tablet (50 mg total) by mouth at bedtime.   Voltaren 1 % Gel Generic drug: diclofenac Sodium Apply 2 g topically daily as needed (pain).       Consultations:  Neurology, psychiatry, palliative care  Procedures/Studies:  CT HEAD WO CONTRAST  Result Date: 12/08/2019 CLINICAL DATA:  Altered mental status. EXAM: CT HEAD WITHOUT CONTRAST TECHNIQUE: Contiguous axial images were obtained from the base of the skull through the vertex without intravenous contrast. COMPARISON:  CT head dated 06/10/2019. FINDINGS: Brain: No evidence of acute infarction, hemorrhage, hydrocephalus, extra-axial collection or mass lesion/mass effect. There is mild cerebral volume loss  with associated ex vacuo dilatation. Periventricular white matter hypoattenuation likely represents chronic small vessel ischemic disease. Chronic infarcts are seen in the right basal ganglia/internal capsule and the left occipital lobe. Vascular: There are vascular calcifications in the carotid siphons. Skull: Normal. Negative for fracture or focal lesion. Sinuses/Orbits: No acute finding. Other: None. IMPRESSION: 1. No acute intracranial process. Electronically Signed   By: Zerita Boers M.D.   On: 12/08/2019 11:59   MR BRAIN WO CONTRAST  Result Date: 12/08/2019 CLINICAL DATA:  Altered mental  status. History of stroke. EXAM: MRI HEAD WITHOUT CONTRAST TECHNIQUE: Multiplanar, multiecho pulse sequences of the brain and surrounding structures were obtained without intravenous contrast. COMPARISON:  Head CT 12/08/2019 and MRI 06/11/2019 FINDINGS: The study is mildly motion degraded. Brain: No acute infarct, mass, midline shift, or extra-axial fluid collection is identified. Patchy to confluent T2 hyperintensities in the cerebral white matter bilaterally are unchanged from the prior MRI and nonspecific but compatible with moderate chronic small vessel ischemic disease. Small chronic infarcts are again seen in the occipital lobes and medial right parietal lobe. There is also an unchanged chronic lacunar infarct with hemosiderin deposition involving the right basal ganglia and the genu of the right internal capsule. There is moderate cerebral atrophy. Vascular: Major intracranial vascular flow voids are preserved. Skull and upper cervical spine: Unremarkable bone marrow signal. Sinuses/Orbits: Bilateral cataract extraction. Minimal mucosal thickening in the ethmoid air cells and left maxillary sinus. At most trace left mastoid fluid. Other: None. IMPRESSION: 1. No acute intracranial abnormality. 2. Moderate chronic small vessel ischemic disease and cerebral atrophy. Electronically Signed   By: Logan Bores M.D.   On: 12/08/2019 16:03   DG Chest Portable 1 View  Result Date: 12/08/2019 CLINICAL DATA:  Altered level of consciousness EXAM: PORTABLE CHEST 1 VIEW COMPARISON:  Chest radiograph dated 06/04/2019 FINDINGS: The heart remains enlarged. Median sternotomy wires are unchanged with fracture of 2 of the wires. A small left pleural effusion with associated atelectasis/airspace disease is noted. The right lung is clear. There is no pneumothorax. There is chronic deformity of the left shoulder. IMPRESSION: Small left pleural effusion with associated atelectasis/airspace disease. Electronically Signed   By:  Zerita Boers M.D.   On: 12/08/2019 11:24   EEG adult  Result Date: 12/09/2019 Lora Havens, MD     12/09/2019 11:38 AM Patient Name: [name] MRN: [id number] Epilepsy Attending: Lora Havens Referring Physician/Provider: Dr. Karmen Bongo Date: 12/09/2019 Duration: 24.30 minutes Patient history: 84 year old male with history of stroke in September 2020 with residual left hemiparesis came in for evaluation of worsening cognition.  EEG to evaluate for seizures. Level of alertness: Awake AEDs during EEG study: None Technical aspects: This EEG study was done with scalp electrodes positioned according to the 10-20 International system of electrode placement. Electrical activity was acquired at a sampling rate of _0  and reviewed with a high frequency filter of _1  and a low frequency filter of _2 . EEG data were recorded continuously and digitally stored. Description: During awake state, no clear posterior dominant was seen.  EEG showed continuous generalized 3 to 5 Hz theta-delta slowing at times with triphasic morphology.  Hyperventilation photic summation were not performed. Per EEG tech annotation, at around 1033 patient was noted to be staring off and not responding to questions.  Concomitant EEG before, during and after the event showed generalized slowing without any seizure activity. Abnormality -Continuous slow, generalized IMPRESSION: This study is suggestive of moderate diffuse encephalopathy, nonspecific etiology. No  seizures or epileptiform discharges were seen throughout the recording. Per EEG tech annotation, 1 episode of staring off was recorded at 1033 without any EEG change and was nonepileptic.       Discharge Exam: Vitals:   12/13/19 0400 12/13/19 0902  BP: (!) 172/60 (!) 133/53  Pulse: 67 73  Resp: 18 14  Temp: 98 F (36.7 C) 98 F (36.7 C)  SpO2: 97% 97%    GENERAL: No apparent distress.  Nontoxic. HEENT: MMM.  Vision and hearing grossly intact.  NECK:  Supple.  No apparent JVD.  RESP: On room air.  No IWOB.  Aeration bilaterally. CVS:  RRR. Heart sounds normal.  ABD/GI/GU: Bowel sounds present. Soft. Non tender.  MSK/EXT:  Moves extremities except LUE.  Notable LUE deformity/paresis. SKIN: no apparent skin lesion or wound NEURO: Awake, alert and oriented self only.  Follows some commands.  No facial asymmetry.  LUE paresis.  Motor 4/5 in RUE and BLE. PSYCH: Calm.  No distress or agitation.   The results of significant diagnostics from this hospitalization (including imaging, microbiology, ancillary and laboratory) are listed below for reference.     Microbiology: Recent Results (from the past 240 hour(s))  SARS CORONAVIRUS 2 (TAT 6-24 HRS) Nasopharyngeal Nasopharyngeal Swab     Status: None   Collection Time: 12/08/19  1:44 PM   Specimen: Nasopharyngeal Swab  Result Value Ref Range Status   SARS Coronavirus 2 NEGATIVE NEGATIVE Final    Comment: (NOTE) SARS-CoV-2 target nucleic acids are NOT DETECTED. The SARS-CoV-2 RNA is generally detectable in upper and lower respiratory specimens during the acute phase of infection. Negative results do not preclude SARS-CoV-2 infection, do not rule out co-infections with other pathogens, and should not be used as the sole basis for treatment or other patient management decisions. Negative results must be combined with clinical observations, patient history, and epidemiological information. The expected result is Negative. Fact Sheet for Patients: SugarRoll.be Fact Sheet for Healthcare Providers: https://www.woods-mathews.com/ This test is not yet approved or cleared by the [place] FDA and  has been authorized for detection and/or diagnosis of SARS-CoV-2 by FDA under an Emergency Use Authorization (EUA). This EUA will remain  in effect (meaning this test can be used) for the duration of the COVID-19 declaration under Section 56 4(b)(1) of the  Act, 21 U.S.C. section 360bbb-3(b)(1), unless the authorization is terminated or revoked sooner. Performed at [facility], [place] [address], [place], [place] [address]   SARS CORONAVIRUS 2 (TAT 6-24 HRS) Nasopharyngeal Nasopharyngeal Swab     Status: None   Collection Time: 12/11/19  2:50 AM   Specimen: Nasopharyngeal Swab  Result Value Ref Range Status   SARS Coronavirus 2 NEGATIVE NEGATIVE Final    Comment: (NOTE) SARS-CoV-2 target nucleic acids are NOT DETECTED. The SARS-CoV-2 RNA is generally detectable in upper and lower respiratory specimens during the acute phase of infection. Negative results do not preclude SARS-CoV-2 infection, do not rule out co-infections with other pathogens, and should not be used as the sole basis for treatment or other patient management decisions. Negative results must be combined with clinical observations, patient history, and epidemiological information. The expected result is Negative. Fact Sheet for Patients: SugarRoll.be Fact Sheet for Healthcare Providers: https://www.woods-mathews.com/ This test is not yet approved or cleared by the [place] FDA and  has been authorized for detection and/or diagnosis of SARS-CoV-2 by FDA under an Emergency Use Authorization (EUA). This EUA will remain  in effect (meaning this test can  be used) for the duration of the COVID-19 declaration under Section 56 4(b)(1) of the Act, 21 U.S.C. section 360bbb-3(b)(1), unless the authorization is terminated or revoked sooner. Performed at [facility], [place] [address], [place], [place] [address]      Labs: BNP (last 3 results) No results for input(s): BNP in the last 8760 hours. Basic Metabolic Panel: Recent Labs  Lab 12/08/19 1113  NA 144  K 3.9  CL 110  CO2 24  GLUCOSE 130*  BUN 31*  CREATININE 2.13*  CALCIUM 10.1   Liver Function Tests: Recent Labs  Lab 12/08/19 1113  AST 16  ALT 12   ALKPHOS 70  BILITOT 1.0  PROT 8.0  ALBUMIN 3.9   No results for input(s): LIPASE, AMYLASE in the last 168 hours. No results for input(s): AMMONIA in the last 168 hours. CBC: Recent Labs  Lab 12/08/19 1113  WBC 6.8  NEUTROABS 4.9  HGB 12.8*  HCT 40.1  MCV 89.7  PLT 259   Cardiac Enzymes: No results for input(s): CKTOTAL, CKMB, CKMBINDEX, TROPONINI in the last 168 hours. BNP: Invalid input(s): POCBNP CBG: Recent Labs  Lab 12/12/19 1241 12/12/19 1708 12/12/19 2108 12/13/19 0620 12/13/19 0905  GLUCAP 188* 245* 165* 152* 164*   D-Dimer No results for input(s): DDIMER in the last 72 hours. Hgb A1c No results for input(s): HGBA1C in the last 72 hours. Lipid Profile No results for input(s): CHOL, HDL, LDLCALC, TRIG, CHOLHDL, LDLDIRECT in the last 72 hours. Thyroid function studies No results for input(s): TSH, T4TOTAL, T3FREE, THYROIDAB in the last 72 hours.  Invalid input(s): FREET3 Anemia work up No results for input(s): VITAMINB12, FOLATE, FERRITIN, TIBC, IRON, RETICCTPCT in the last 72 hours. Urinalysis    Component Value Date/Time   COLORURINE YELLOW 12/08/2019 1417   APPEARANCEUR CLEAR 12/08/2019 1417   APPEARANCEUR Clear 11/25/2019 1158   LABSPEC 1.016 12/08/2019 1417   PHURINE 6.0 12/08/2019 1417   GLUCOSEU NEGATIVE 12/08/2019 1417   HGBUR NEGATIVE 12/08/2019 1417   BILIRUBINUR NEGATIVE 12/08/2019 1417   BILIRUBINUR Negative 11/25/2019 1158   [place] 12/08/2019 1417   PROTEINUR 100 (A) 12/08/2019 1417   NITRITE NEGATIVE 12/08/2019 1417   LEUKOCYTESUR NEGATIVE 12/08/2019 1417   Sepsis Labs Invalid input(s): PROCALCITONIN,  WBC,  LACTICIDVEN   Time coordinating discharge: 45 minutes  SIGNED:  Mercy Riding, MD  [facility] 12/13/2019, 9:58 AM  If 7PM-7AM, please contact night-coverage [email or web address] Password [email or web address]

## 2019-12-13 NOTE — Progress Notes (Signed)
Attempted to call report to nursing staff at [facility], [phone], Room 204. Operator transferred my call to the nursing desk and phone call rang unanswered for >4 minutes.   AVS sent with EMS for patient transfer to [place].

## 2019-12-13 NOTE — TOC Transition Note (Signed)
Transition of Care [facility]) - CM/SW Discharge Note   Patient Details  Name: [name] MRN: [id number] Date of Birth: [birth year]  [facility] CM/SW Contact:  Geralynn Ochs, LCSW Phone Number: 12/13/2019, 11:44 AM   Clinical Narrative:   Nurse to call report to [phone], Room 204    Final next level of care: Skilled Nursing Facility Barriers to Discharge: Barriers Resolved   Patient Goals and CMS Choice Patient states their goals for this hospitalization and ongoing recovery are:: patient unable to participate in goal setting due to disorientation CMS Medicare.gov Compare Post Acute Care list provided to:: Patient Represenative (must comment) Choice offered to / list presented to : Spouse, Adult Children  Discharge Placement              Patient chooses bed at: [facility] Patient to be transferred to facility by: [place] Name of family member notified: Wife, daughter Patient and family notified of of transfer: 12/13/19  Discharge Plan and Services     Post Acute Care Choice: [place]                               Social Determinants of Health (SDOH) Interventions     Readmission Risk Interventions No flowsheet data found.

## 2019-12-16 DIAGNOSIS — E119 Type 2 diabetes mellitus without complications: Secondary | ICD-10-CM | POA: Diagnosis not present

## 2019-12-16 DIAGNOSIS — I10 Essential (primary) hypertension: Secondary | ICD-10-CM | POA: Diagnosis not present

## 2019-12-16 DIAGNOSIS — N184 Chronic kidney disease, stage 4 (severe): Secondary | ICD-10-CM | POA: Diagnosis not present

## 2019-12-16 DIAGNOSIS — I2510 Atherosclerotic heart disease of native coronary artery without angina pectoris: Secondary | ICD-10-CM | POA: Diagnosis not present

## 2019-12-16 NOTE — Progress Notes (Signed)
I agree with the above plan 

## 2019-12-24 ENCOUNTER — Other Ambulatory Visit: Payer: Medicare Other

## 2019-12-25 DIAGNOSIS — I2510 Atherosclerotic heart disease of native coronary artery without angina pectoris: Secondary | ICD-10-CM | POA: Diagnosis not present

## 2019-12-25 DIAGNOSIS — E119 Type 2 diabetes mellitus without complications: Secondary | ICD-10-CM | POA: Diagnosis not present

## 2019-12-25 DIAGNOSIS — I10 Essential (primary) hypertension: Secondary | ICD-10-CM | POA: Diagnosis not present

## 2019-12-30 DIAGNOSIS — E119 Type 2 diabetes mellitus without complications: Secondary | ICD-10-CM | POA: Diagnosis not present

## 2020-01-02 DIAGNOSIS — M6281 Muscle weakness (generalized): Secondary | ICD-10-CM | POA: Diagnosis not present

## 2020-01-02 DIAGNOSIS — I10 Essential (primary) hypertension: Secondary | ICD-10-CM | POA: Diagnosis not present

## 2020-01-02 DIAGNOSIS — E119 Type 2 diabetes mellitus without complications: Secondary | ICD-10-CM | POA: Diagnosis not present

## 2020-01-03 DIAGNOSIS — I2510 Atherosclerotic heart disease of native coronary artery without angina pectoris: Secondary | ICD-10-CM | POA: Diagnosis not present

## 2020-01-03 DIAGNOSIS — I10 Essential (primary) hypertension: Secondary | ICD-10-CM | POA: Diagnosis not present

## 2020-01-03 DIAGNOSIS — M1A00X0 Idiopathic chronic gout, unspecified site, without tophus (tophi): Secondary | ICD-10-CM | POA: Diagnosis not present

## 2020-01-06 ENCOUNTER — Ambulatory Visit: Payer: Medicare Other | Admitting: Adult Health

## 2020-01-08 DIAGNOSIS — M1A00X0 Idiopathic chronic gout, unspecified site, without tophus (tophi): Secondary | ICD-10-CM | POA: Diagnosis not present

## 2020-01-08 DIAGNOSIS — E119 Type 2 diabetes mellitus without complications: Secondary | ICD-10-CM | POA: Diagnosis not present

## 2020-01-08 DIAGNOSIS — I2510 Atherosclerotic heart disease of native coronary artery without angina pectoris: Secondary | ICD-10-CM | POA: Diagnosis not present

## 2020-01-10 DIAGNOSIS — Z5181 Encounter for therapeutic drug level monitoring: Secondary | ICD-10-CM | POA: Diagnosis not present

## 2020-01-14 DIAGNOSIS — S79911A Unspecified injury of right hip, initial encounter: Secondary | ICD-10-CM | POA: Diagnosis not present

## 2020-01-14 DIAGNOSIS — W1839XA Other fall on same level, initial encounter: Secondary | ICD-10-CM | POA: Diagnosis not present

## 2020-01-14 DIAGNOSIS — M25551 Pain in right hip: Secondary | ICD-10-CM | POA: Diagnosis not present

## 2020-01-14 DIAGNOSIS — Y92122 Bedroom in nursing home as the place of occurrence of the external cause: Secondary | ICD-10-CM | POA: Diagnosis not present

## 2020-01-14 DIAGNOSIS — R0902 Hypoxemia: Secondary | ICD-10-CM | POA: Diagnosis not present

## 2020-01-14 DIAGNOSIS — Z7901 Long term (current) use of anticoagulants: Secondary | ICD-10-CM | POA: Diagnosis not present

## 2020-01-14 DIAGNOSIS — S01312A Laceration without foreign body of left ear, initial encounter: Secondary | ICD-10-CM | POA: Diagnosis not present

## 2020-01-14 DIAGNOSIS — W19XXXA Unspecified fall, initial encounter: Secondary | ICD-10-CM | POA: Diagnosis not present

## 2020-01-14 DIAGNOSIS — Z743 Need for continuous supervision: Secondary | ICD-10-CM | POA: Diagnosis not present

## 2020-01-14 DIAGNOSIS — W1830XA Fall on same level, unspecified, initial encounter: Secondary | ICD-10-CM | POA: Diagnosis not present

## 2020-01-14 DIAGNOSIS — S199XXA Unspecified injury of neck, initial encounter: Secondary | ICD-10-CM | POA: Diagnosis not present

## 2020-01-14 DIAGNOSIS — S0990XA Unspecified injury of head, initial encounter: Secondary | ICD-10-CM | POA: Diagnosis not present

## 2020-01-16 DIAGNOSIS — I2510 Atherosclerotic heart disease of native coronary artery without angina pectoris: Secondary | ICD-10-CM | POA: Diagnosis not present

## 2020-01-16 DIAGNOSIS — E119 Type 2 diabetes mellitus without complications: Secondary | ICD-10-CM | POA: Diagnosis not present

## 2020-01-16 DIAGNOSIS — M1A00X0 Idiopathic chronic gout, unspecified site, without tophus (tophi): Secondary | ICD-10-CM | POA: Diagnosis not present

## 2020-01-18 DIAGNOSIS — R0902 Hypoxemia: Secondary | ICD-10-CM | POA: Diagnosis not present

## 2020-01-18 DIAGNOSIS — I517 Cardiomegaly: Secondary | ICD-10-CM | POA: Diagnosis not present

## 2020-01-18 DIAGNOSIS — D649 Anemia, unspecified: Secondary | ICD-10-CM | POA: Diagnosis not present

## 2020-01-18 DIAGNOSIS — I10 Essential (primary) hypertension: Secondary | ICD-10-CM | POA: Diagnosis not present

## 2020-01-18 DIAGNOSIS — R918 Other nonspecific abnormal finding of lung field: Secondary | ICD-10-CM | POA: Diagnosis not present

## 2020-01-19 DIAGNOSIS — E1122 Type 2 diabetes mellitus with diabetic chronic kidney disease: Secondary | ICD-10-CM | POA: Diagnosis not present

## 2020-01-19 DIAGNOSIS — Z743 Need for continuous supervision: Secondary | ICD-10-CM | POA: Diagnosis not present

## 2020-01-19 DIAGNOSIS — I509 Heart failure, unspecified: Secondary | ICD-10-CM | POA: Diagnosis not present

## 2020-01-19 DIAGNOSIS — I252 Old myocardial infarction: Secondary | ICD-10-CM | POA: Diagnosis not present

## 2020-01-19 DIAGNOSIS — S72012A Unspecified intracapsular fracture of left femur, initial encounter for closed fracture: Secondary | ICD-10-CM | POA: Diagnosis not present

## 2020-01-19 DIAGNOSIS — E7800 Pure hypercholesterolemia, unspecified: Secondary | ICD-10-CM | POA: Diagnosis not present

## 2020-01-19 DIAGNOSIS — N1832 Chronic kidney disease, stage 3b: Secondary | ICD-10-CM | POA: Diagnosis not present

## 2020-01-19 DIAGNOSIS — Z7401 Bed confinement status: Secondary | ICD-10-CM | POA: Diagnosis not present

## 2020-01-19 DIAGNOSIS — I447 Left bundle-branch block, unspecified: Secondary | ICD-10-CM | POA: Diagnosis not present

## 2020-01-19 DIAGNOSIS — E119 Type 2 diabetes mellitus without complications: Secondary | ICD-10-CM | POA: Diagnosis not present

## 2020-01-19 DIAGNOSIS — J80 Acute respiratory distress syndrome: Secondary | ICD-10-CM | POA: Diagnosis not present

## 2020-01-19 DIAGNOSIS — I5043 Acute on chronic combined systolic (congestive) and diastolic (congestive) heart failure: Secondary | ICD-10-CM | POA: Diagnosis not present

## 2020-01-19 DIAGNOSIS — N189 Chronic kidney disease, unspecified: Secondary | ICD-10-CM | POA: Diagnosis not present

## 2020-01-19 DIAGNOSIS — Z7984 Long term (current) use of oral hypoglycemic drugs: Secondary | ICD-10-CM | POA: Diagnosis not present

## 2020-01-19 DIAGNOSIS — Z96651 Presence of right artificial knee joint: Secondary | ICD-10-CM | POA: Diagnosis not present

## 2020-01-19 DIAGNOSIS — J9691 Respiratory failure, unspecified with hypoxia: Secondary | ICD-10-CM | POA: Diagnosis not present

## 2020-01-19 DIAGNOSIS — R0902 Hypoxemia: Secondary | ICD-10-CM | POA: Diagnosis not present

## 2020-01-19 DIAGNOSIS — I130 Hypertensive heart and chronic kidney disease with heart failure and stage 1 through stage 4 chronic kidney disease, or unspecified chronic kidney disease: Secondary | ICD-10-CM | POA: Diagnosis not present

## 2020-01-19 DIAGNOSIS — J9692 Respiratory failure, unspecified with hypercapnia: Secondary | ICD-10-CM | POA: Diagnosis not present

## 2020-01-19 DIAGNOSIS — J9690 Respiratory failure, unspecified, unspecified whether with hypoxia or hypercapnia: Secondary | ICD-10-CM | POA: Diagnosis not present

## 2020-01-19 DIAGNOSIS — R0602 Shortness of breath: Secondary | ICD-10-CM | POA: Diagnosis not present

## 2020-01-19 DIAGNOSIS — Z7902 Long term (current) use of antithrombotics/antiplatelets: Secondary | ICD-10-CM | POA: Diagnosis not present

## 2020-01-19 DIAGNOSIS — H9190 Unspecified hearing loss, unspecified ear: Secondary | ICD-10-CM | POA: Diagnosis not present

## 2020-01-19 DIAGNOSIS — I69351 Hemiplegia and hemiparesis following cerebral infarction affecting right dominant side: Secondary | ICD-10-CM | POA: Diagnosis not present

## 2020-01-19 DIAGNOSIS — J189 Pneumonia, unspecified organism: Secondary | ICD-10-CM | POA: Diagnosis not present

## 2020-01-19 DIAGNOSIS — Z66 Do not resuscitate: Secondary | ICD-10-CM | POA: Diagnosis not present

## 2020-01-19 DIAGNOSIS — R5381 Other malaise: Secondary | ICD-10-CM | POA: Diagnosis not present

## 2020-01-19 DIAGNOSIS — M109 Gout, unspecified: Secondary | ICD-10-CM | POA: Diagnosis not present

## 2020-01-19 DIAGNOSIS — N183 Chronic kidney disease, stage 3 unspecified: Secondary | ICD-10-CM | POA: Diagnosis not present

## 2020-01-19 DIAGNOSIS — I2510 Atherosclerotic heart disease of native coronary artery without angina pectoris: Secondary | ICD-10-CM | POA: Diagnosis not present

## 2020-01-19 DIAGNOSIS — Z951 Presence of aortocoronary bypass graft: Secondary | ICD-10-CM | POA: Diagnosis not present

## 2020-01-19 DIAGNOSIS — R410 Disorientation, unspecified: Secondary | ICD-10-CM | POA: Diagnosis not present

## 2020-01-19 DIAGNOSIS — J9601 Acute respiratory failure with hypoxia: Secondary | ICD-10-CM | POA: Diagnosis not present

## 2020-01-19 DIAGNOSIS — J9602 Acute respiratory failure with hypercapnia: Secondary | ICD-10-CM | POA: Diagnosis not present

## 2020-01-19 DIAGNOSIS — J90 Pleural effusion, not elsewhere classified: Secondary | ICD-10-CM | POA: Diagnosis not present

## 2020-01-19 DIAGNOSIS — Z20822 Contact with and (suspected) exposure to covid-19: Secondary | ICD-10-CM | POA: Diagnosis not present

## 2020-01-19 DIAGNOSIS — R0689 Other abnormalities of breathing: Secondary | ICD-10-CM | POA: Diagnosis not present

## 2020-01-19 DIAGNOSIS — R0600 Dyspnea, unspecified: Secondary | ICD-10-CM | POA: Diagnosis not present

## 2020-01-19 DIAGNOSIS — S72002A Fracture of unspecified part of neck of left femur, initial encounter for closed fracture: Secondary | ICD-10-CM | POA: Diagnosis not present

## 2020-01-19 DIAGNOSIS — R7989 Other specified abnormal findings of blood chemistry: Secondary | ICD-10-CM | POA: Diagnosis not present

## 2020-01-19 DIAGNOSIS — N179 Acute kidney failure, unspecified: Secondary | ICD-10-CM | POA: Diagnosis not present

## 2020-01-25 DIAGNOSIS — I639 Cerebral infarction, unspecified: Secondary | ICD-10-CM | POA: Diagnosis not present

## 2020-01-26 DIAGNOSIS — E119 Type 2 diabetes mellitus without complications: Secondary | ICD-10-CM | POA: Diagnosis not present

## 2020-01-26 DIAGNOSIS — I2510 Atherosclerotic heart disease of native coronary artery without angina pectoris: Secondary | ICD-10-CM | POA: Diagnosis not present

## 2020-01-26 DIAGNOSIS — Z888 Allergy status to other drugs, medicaments and biological substances status: Secondary | ICD-10-CM | POA: Diagnosis not present

## 2020-01-26 DIAGNOSIS — Z87891 Personal history of nicotine dependence: Secondary | ICD-10-CM | POA: Diagnosis not present

## 2020-01-26 DIAGNOSIS — I499 Cardiac arrhythmia, unspecified: Secondary | ICD-10-CM | POA: Diagnosis not present

## 2020-01-26 DIAGNOSIS — E1165 Type 2 diabetes mellitus with hyperglycemia: Secondary | ICD-10-CM | POA: Diagnosis not present

## 2020-01-26 DIAGNOSIS — J449 Chronic obstructive pulmonary disease, unspecified: Secondary | ICD-10-CM | POA: Diagnosis not present

## 2020-01-26 DIAGNOSIS — R404 Transient alteration of awareness: Secondary | ICD-10-CM | POA: Diagnosis not present

## 2020-01-26 DIAGNOSIS — Z7984 Long term (current) use of oral hypoglycemic drugs: Secondary | ICD-10-CM | POA: Diagnosis not present

## 2020-01-26 DIAGNOSIS — Z8673 Personal history of transient ischemic attack (TIA), and cerebral infarction without residual deficits: Secondary | ICD-10-CM | POA: Diagnosis not present

## 2020-01-26 DIAGNOSIS — I110 Hypertensive heart disease with heart failure: Secondary | ICD-10-CM | POA: Diagnosis not present

## 2020-01-26 DIAGNOSIS — R092 Respiratory arrest: Secondary | ICD-10-CM | POA: Diagnosis not present

## 2020-01-26 DIAGNOSIS — Z66 Do not resuscitate: Secondary | ICD-10-CM | POA: Diagnosis not present

## 2020-01-26 DIAGNOSIS — I4891 Unspecified atrial fibrillation: Secondary | ICD-10-CM | POA: Diagnosis not present

## 2020-01-26 DIAGNOSIS — Z79899 Other long term (current) drug therapy: Secondary | ICD-10-CM | POA: Diagnosis not present

## 2020-01-26 DIAGNOSIS — I509 Heart failure, unspecified: Secondary | ICD-10-CM | POA: Diagnosis not present

## 2020-01-26 DIAGNOSIS — Z743 Need for continuous supervision: Secondary | ICD-10-CM | POA: Diagnosis not present

## 2020-02-03 ENCOUNTER — Ambulatory Visit: Payer: Medicare Other | Admitting: Adult Health

## 2020-02-11 DEATH — deceased

## 2020-02-14 ENCOUNTER — Ambulatory Visit: Payer: Medicare Other | Admitting: Urology

## 2020-02-21 ENCOUNTER — Ambulatory Visit: Payer: Medicare Other | Admitting: Urology

## 2020-05-27 IMAGING — DX DG KNEE COMPLETE 4+V*L*
4 series · 4 of 4 positions shown · non-contrast
Comparison: None.

CLINICAL DATA: Left knee pain for several days, no injury.

EXAM:
LEFT KNEE - COMPLETE 4+ VIEW

[knee ap]
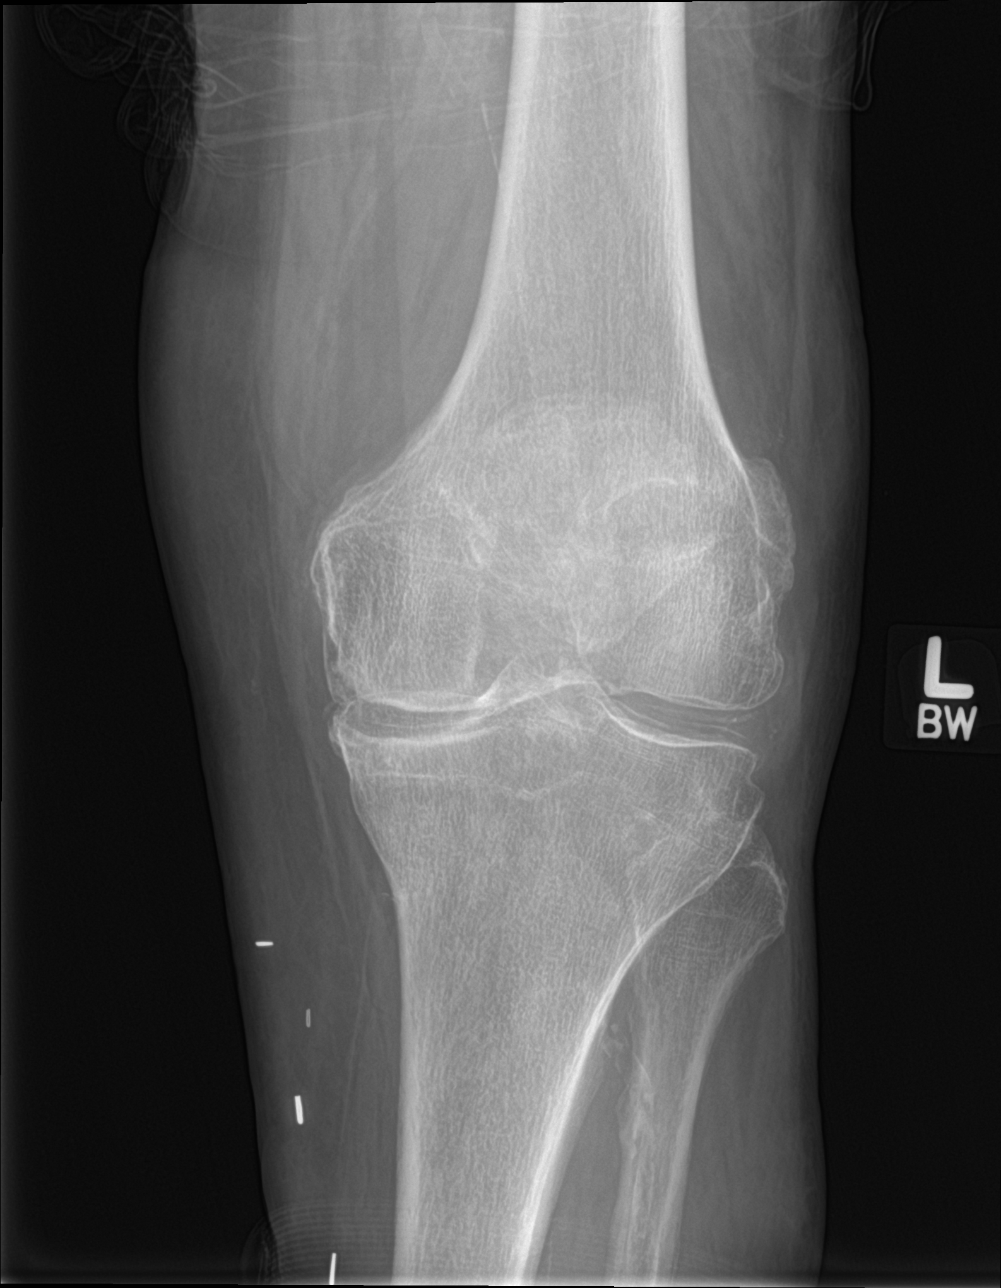

[knee obl (1 of 2)]
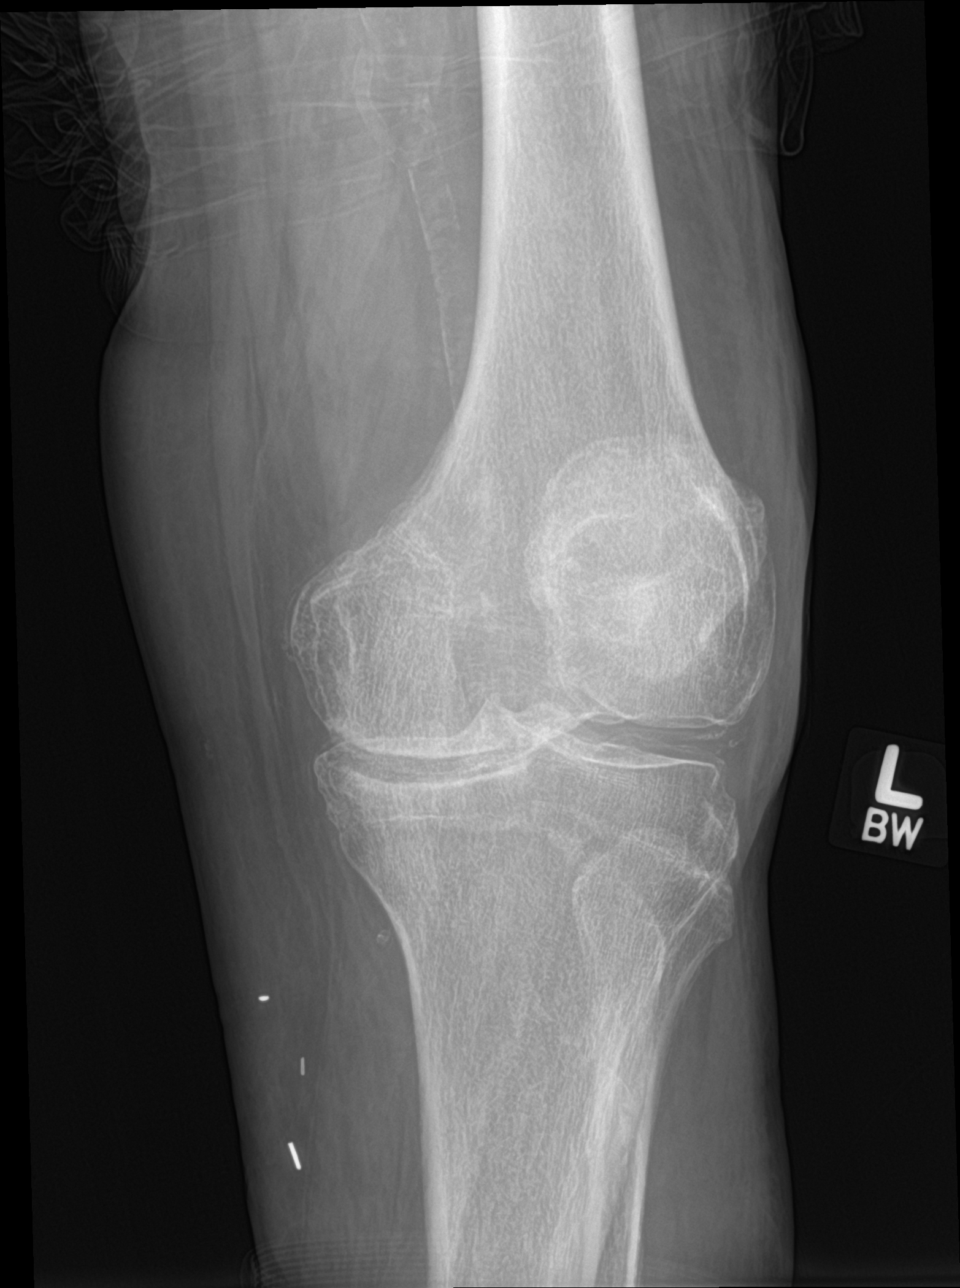

[knee obl (2 of 2)]
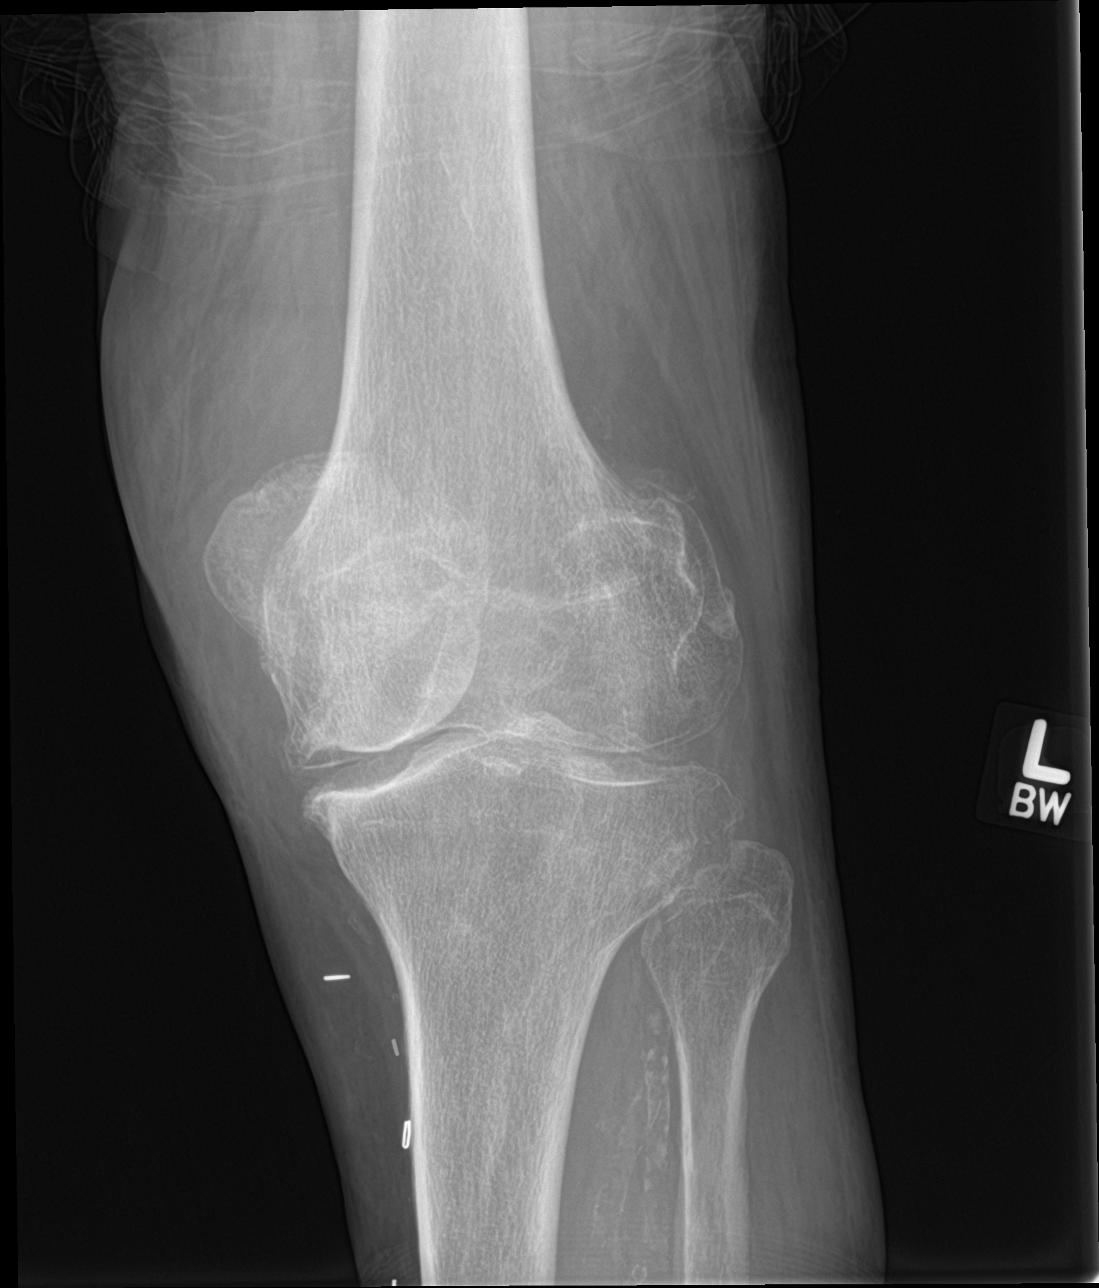

[tunnel]
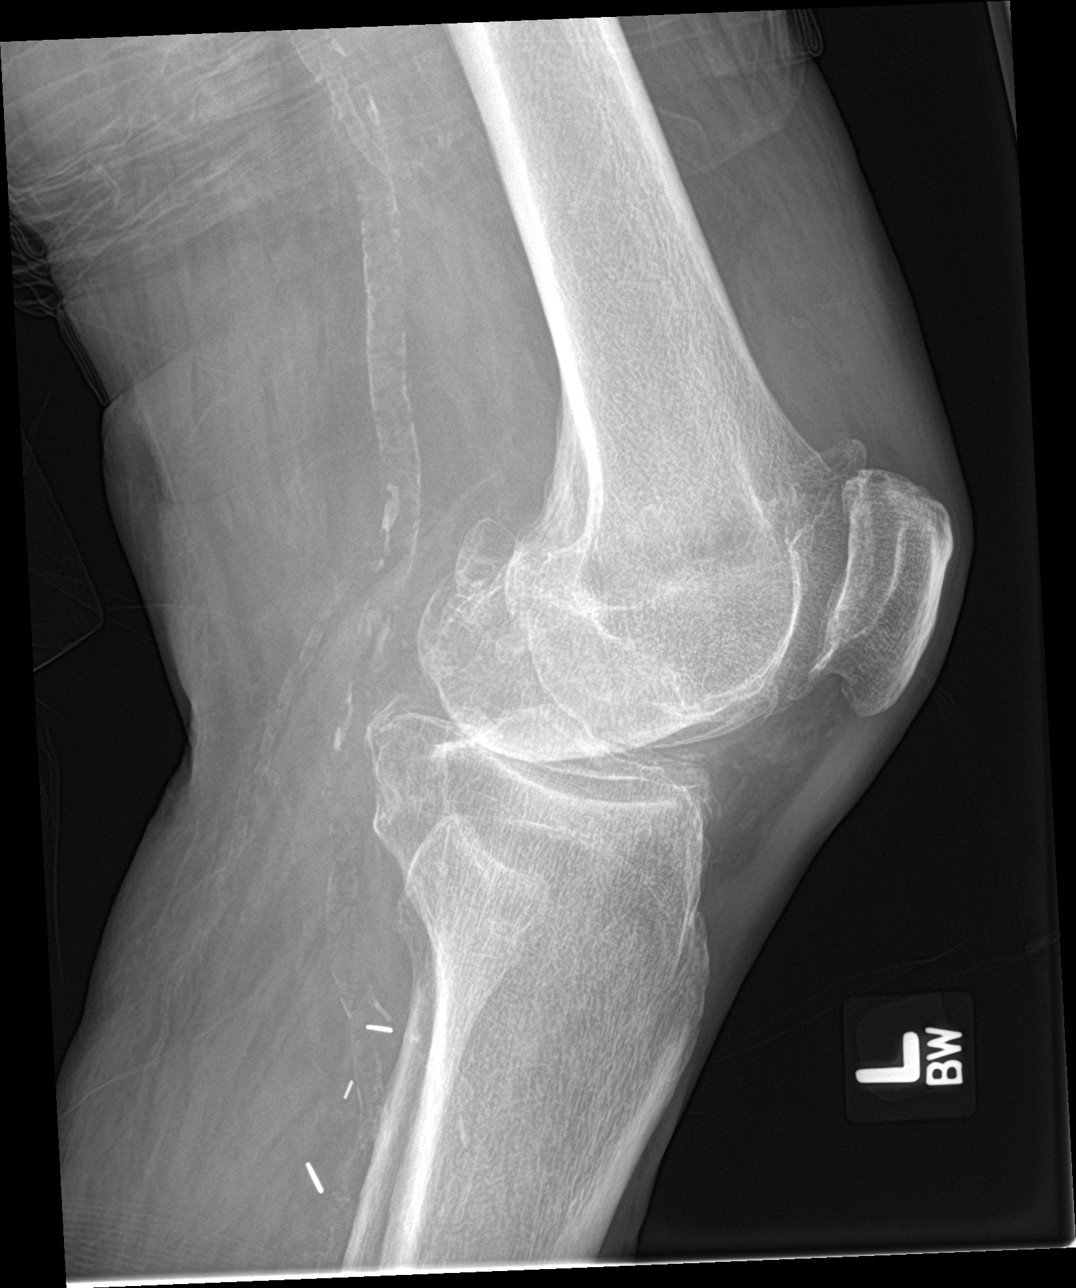

[4 of 4 positions shown; findings below may reference images not displayed]

FINDINGS: Joint space narrowing and mild subchondral sclerosis in the medial
compartment. Chondrocalcinosis in the medial and lateral
compartments. Osteophytosis in the medial and patellofemoral
compartments. Small joint effusion. Vascular calcifications are
present.
IMPRESSION: 1. Mild to moderate tricompartment osteoarthritis.
2. Small joint effusion.

## 2020-10-25 IMAGING — MR MR HEAD W/O CM
7 of 11 series · 25 of 48 positions shown · non-contrast
Comparison: Head CT 12/08/2019 and MRI 06/11/2019

CLINICAL DATA: Altered mental status. History of stroke.

EXAM:
MRI HEAD WITHOUT CONTRAST
TECHNIQUE: Multiplanar, multiecho pulse sequences of the brain and surrounding
structures were obtained without intravenous contrast.

[Series 3: DWI · axial · 3.0mm · 0.94mm/px · z∈[-128,+15]mm · 7 of 100 slices shown (1 of 2)]
[im 1/100]
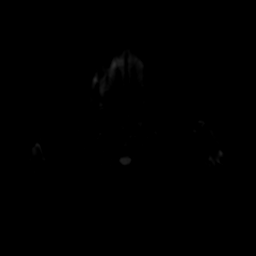
[im 17/100]
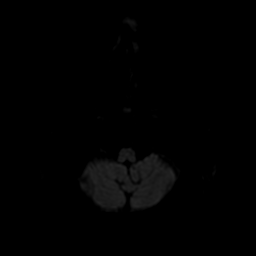
[im 34/100]
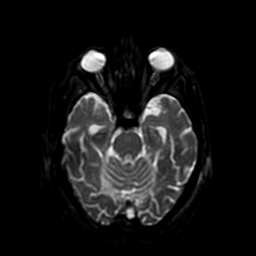
[im 50/100]
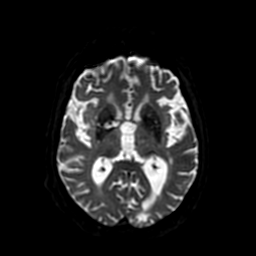
[im 67/100]
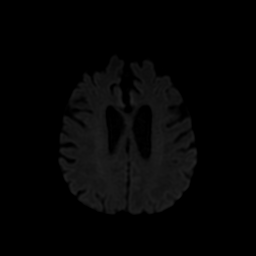
[im 83/100]
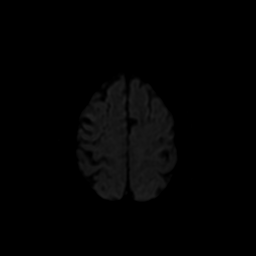
[im 100/100]
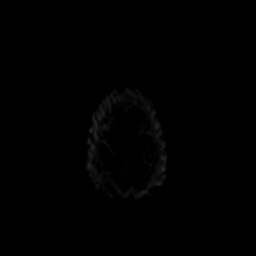

[Series 4: DWI · coronal · 4.0mm · 0.94mm/px · 6 of 76 slices shown (2 of 2)]
[im 1/76]
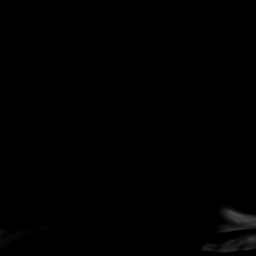
[im 16/76]
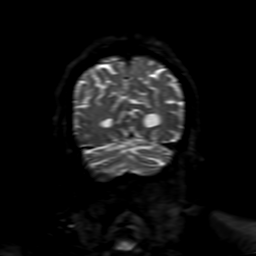
[im 31/76]
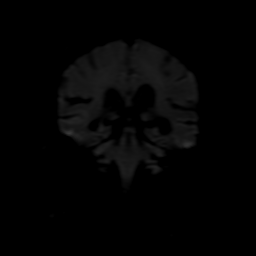
[im 46/76]
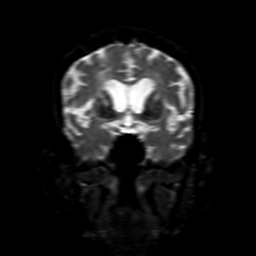
[im 61/76]
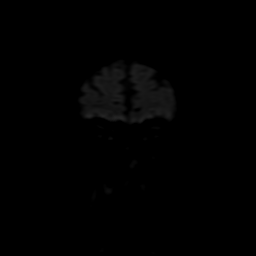
[im 76/76]
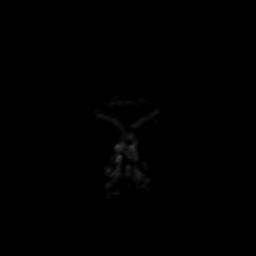

[Series 5: FLAIR · sagittal · 5.0mm · 0.23mm/px · 2 of 25 slices shown (1 of 2)]
[im 1/25]
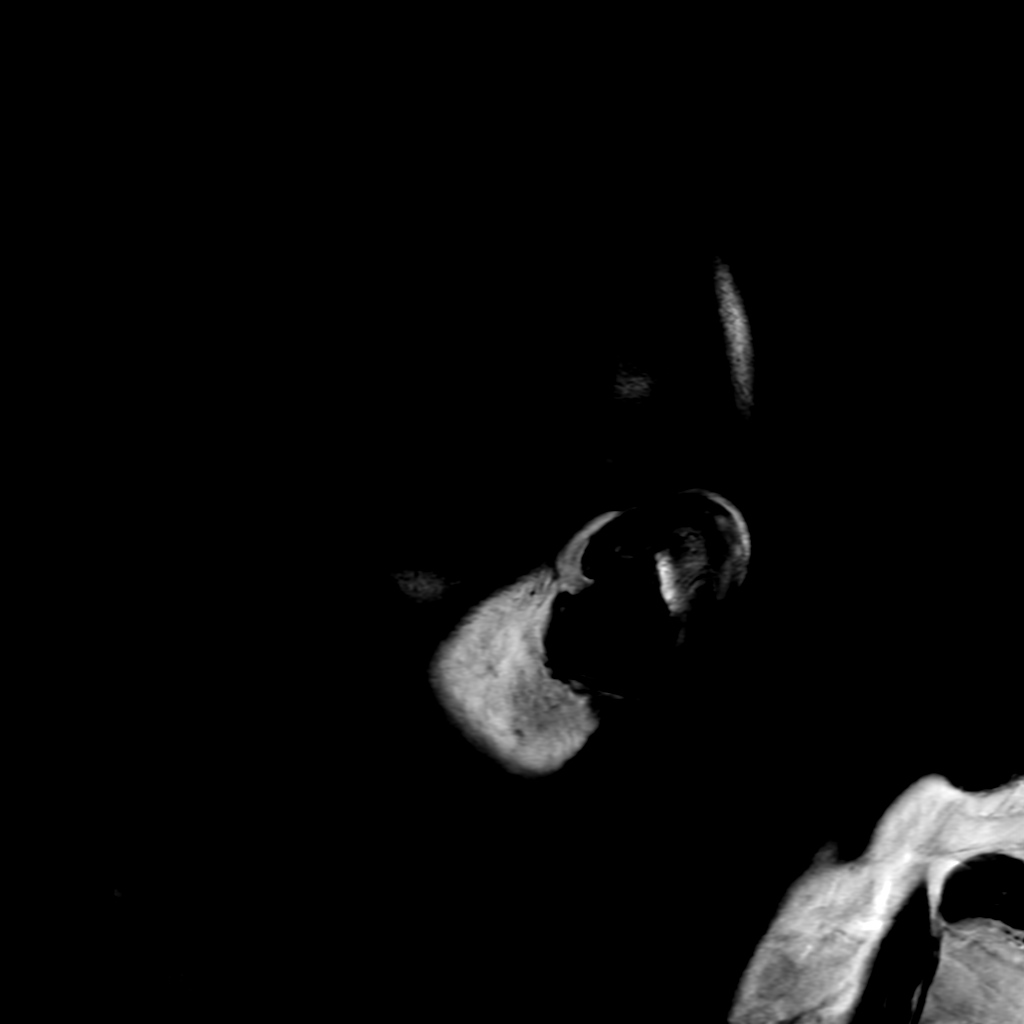
[im 25/25]
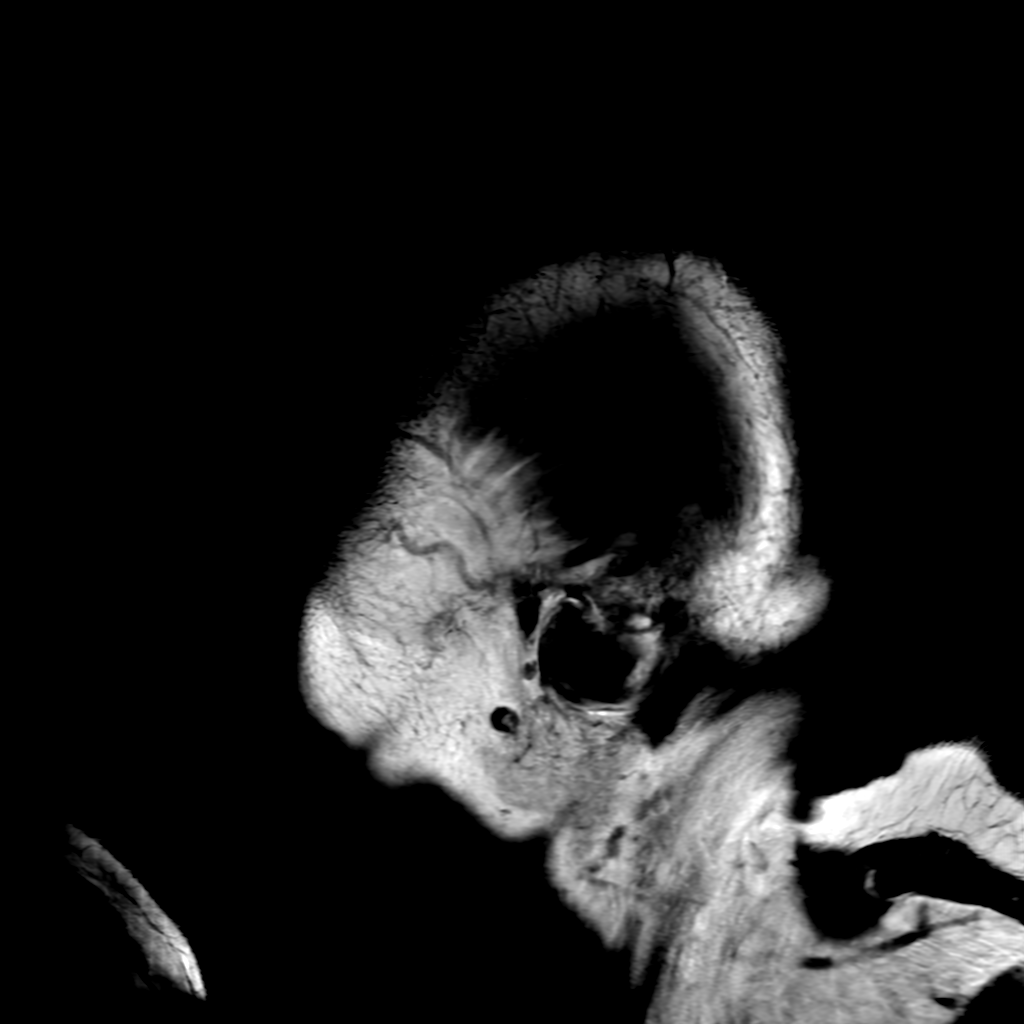

[Series 6: T2 · axial · 5.0mm · 0.23mm/px · 1 of 26 slices shown]
[im 1/26]
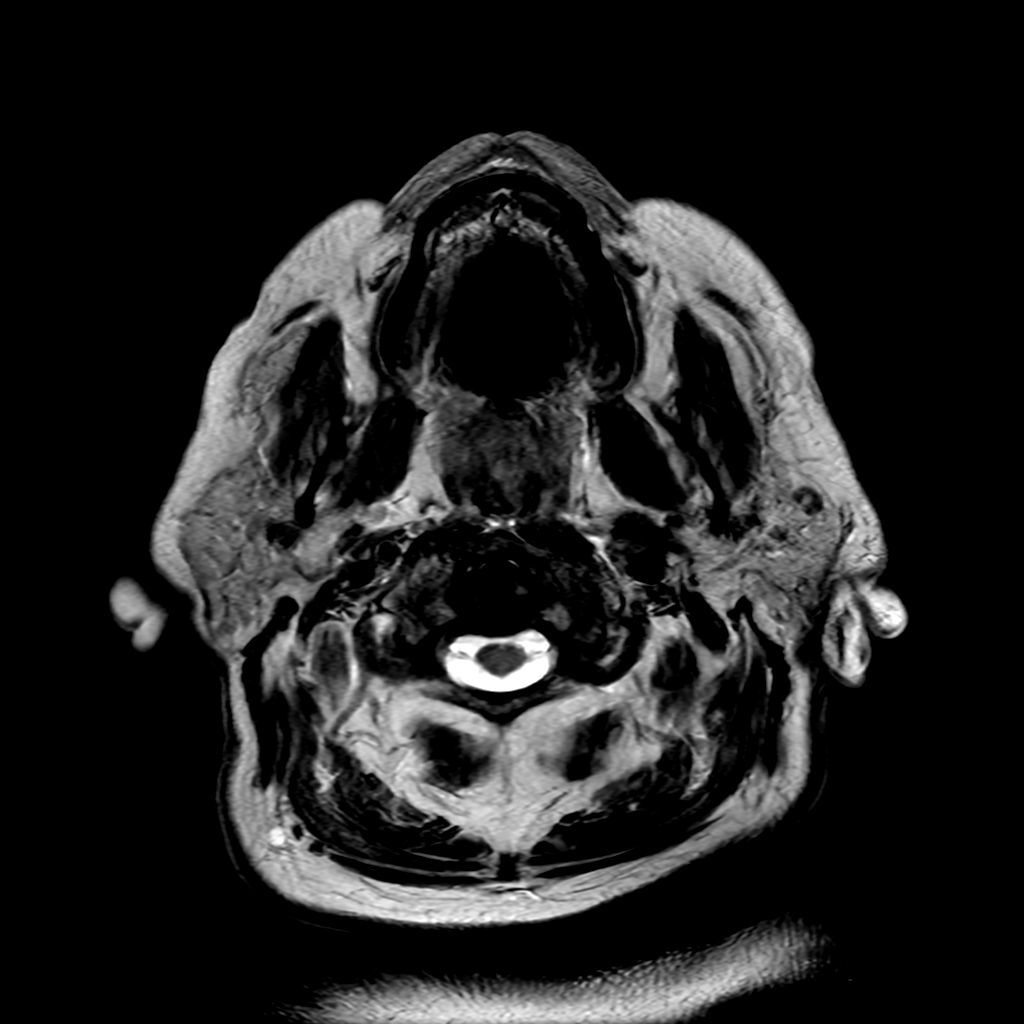

[Series 7: FLAIR · axial · 3.0mm · 0.41mm/px · z∈[-135,+12]mm · 2 of 26 slices shown (2 of 2)]
[im 1/26]
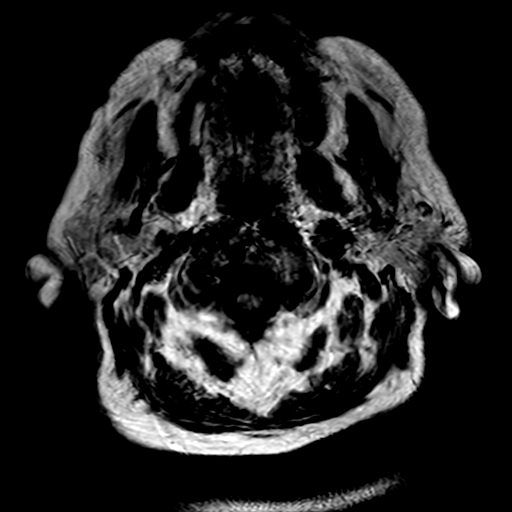
[im 26/26]
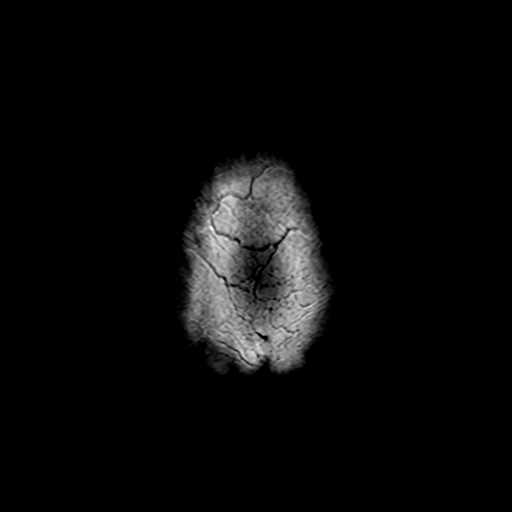

[Series 350: ADC · axial · 3.0mm · 0.94mm/px · z∈[-128,+15]mm · 4 of 50 slices shown (1 of 2)]
[im 1/50]
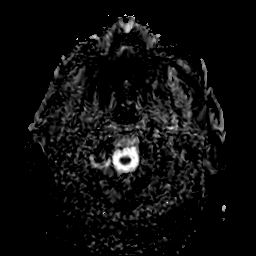
[im 17/50]
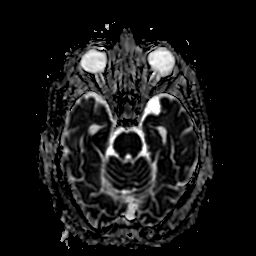
[im 33/50]
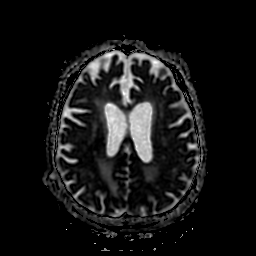
[im 50/50]
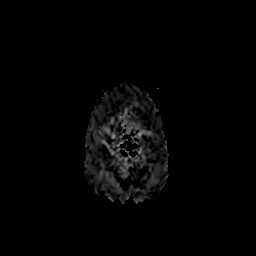

[Series 450: ADC · coronal · 4.0mm · 0.94mm/px · 3 of 38 slices shown (2 of 2)]
[im 1/38]
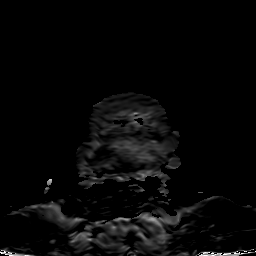
[im 19/38]
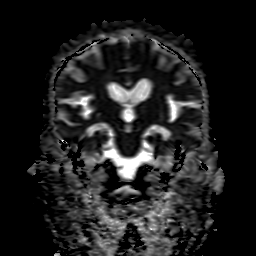
[im 38/38]
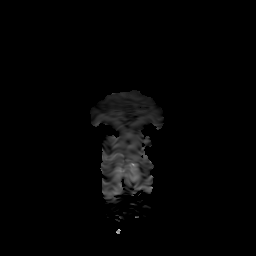

[25 of 48 positions shown; findings below may reference images not displayed]

FINDINGS: The study is mildly motion degraded.

Brain: No acute infarct, mass, midline shift, or extra-axial fluid
collection is identified. Patchy to confluent T2 hyperintensities in
the cerebral white matter bilaterally are unchanged from the prior
MRI and nonspecific but compatible with moderate chronic small
vessel ischemic disease. Small chronic infarcts are again seen in
the occipital lobes and medial right parietal lobe. There is also an
unchanged chronic lacunar infarct with hemosiderin deposition
involving the right basal ganglia and the genu of the right internal
capsule. There is moderate cerebral atrophy.

Vascular: Major intracranial vascular flow voids are preserved.

Skull and upper cervical spine: Unremarkable bone marrow signal.

Sinuses/Orbits: Bilateral cataract extraction. Minimal mucosal
thickening in the ethmoid air cells and left maxillary sinus. At
most trace left mastoid fluid.

Other: None.
IMPRESSION: 1. No acute intracranial abnormality.
2. Moderate chronic small vessel ischemic disease and cerebral
atrophy.
